# Patient Record
Sex: Male | Born: 1960 | Race: White | Hispanic: No | Marital: Married | Smoking: Never smoker
Health system: Southern US, Community
[De-identification: ages and names within clinical notes are randomized; demographics above are authoritative.]

## PROBLEM LIST (undated history)

## (undated) DIAGNOSIS — I10 Essential (primary) hypertension: Secondary | ICD-10-CM

## (undated) DIAGNOSIS — IMO0002 Reserved for concepts with insufficient information to code with codable children: Secondary | ICD-10-CM

## (undated) DIAGNOSIS — N189 Chronic kidney disease, unspecified: Secondary | ICD-10-CM

## (undated) DIAGNOSIS — M329 Systemic lupus erythematosus, unspecified: Secondary | ICD-10-CM

## (undated) DIAGNOSIS — D631 Anemia in chronic kidney disease: Secondary | ICD-10-CM

## (undated) DIAGNOSIS — C61 Malignant neoplasm of prostate: Secondary | ICD-10-CM

## (undated) DIAGNOSIS — Z940 Kidney transplant status: Secondary | ICD-10-CM

## (undated) DIAGNOSIS — I82409 Acute embolism and thrombosis of unspecified deep veins of unspecified lower extremity: Secondary | ICD-10-CM

## (undated) HISTORY — DX: Chronic kidney disease, unspecified: N18.9

## (undated) HISTORY — DX: Essential (primary) hypertension: I10

## (undated) HISTORY — DX: Anemia in chronic kidney disease: D63.1

---

## 1997-07-23 HISTORY — PX: LASIK: SHX215

## 1998-07-23 HISTORY — PX: OTHER SURGICAL HISTORY: SHX169

## 2015-03-22 DIAGNOSIS — I10 Essential (primary) hypertension: Secondary | ICD-10-CM | POA: Insufficient documentation

## 2015-03-22 DIAGNOSIS — G4700 Insomnia, unspecified: Secondary | ICD-10-CM | POA: Insufficient documentation

## 2015-04-11 DIAGNOSIS — D6189 Other specified aplastic anemias and other bone marrow failure syndromes: Secondary | ICD-10-CM | POA: Insufficient documentation

## 2016-05-22 DIAGNOSIS — M329 Systemic lupus erythematosus, unspecified: Secondary | ICD-10-CM | POA: Insufficient documentation

## 2016-06-11 DIAGNOSIS — D631 Anemia in chronic kidney disease: Secondary | ICD-10-CM | POA: Insufficient documentation

## 2016-06-26 DIAGNOSIS — Z9221 Personal history of antineoplastic chemotherapy: Secondary | ICD-10-CM | POA: Insufficient documentation

## 2016-06-26 DIAGNOSIS — Z79620 Long term (current) use of immunosuppressive biologic: Secondary | ICD-10-CM | POA: Insufficient documentation

## 2016-06-26 DIAGNOSIS — N189 Chronic kidney disease, unspecified: Secondary | ICD-10-CM | POA: Insufficient documentation

## 2016-06-26 DIAGNOSIS — N184 Chronic kidney disease, stage 4 (severe): Secondary | ICD-10-CM | POA: Insufficient documentation

## 2016-06-26 DIAGNOSIS — M3214 Glomerular disease in systemic lupus erythematosus: Secondary | ICD-10-CM | POA: Insufficient documentation

## 2016-07-03 DIAGNOSIS — N019 Rapidly progressive nephritic syndrome with unspecified morphologic changes: Secondary | ICD-10-CM | POA: Insufficient documentation

## 2017-11-01 DIAGNOSIS — N289 Disorder of kidney and ureter, unspecified: Secondary | ICD-10-CM | POA: Insufficient documentation

## 2017-11-15 DIAGNOSIS — E872 Acidosis, unspecified: Secondary | ICD-10-CM | POA: Insufficient documentation

## 2017-11-15 DIAGNOSIS — E559 Vitamin D deficiency, unspecified: Secondary | ICD-10-CM | POA: Insufficient documentation

## 2017-11-15 DIAGNOSIS — E7211 Homocystinuria: Secondary | ICD-10-CM | POA: Insufficient documentation

## 2018-09-03 ENCOUNTER — Other Ambulatory Visit (HOSPITAL_BASED_OUTPATIENT_CLINIC_OR_DEPARTMENT_OTHER): Payer: Self-pay

## 2018-09-03 DIAGNOSIS — R5383 Other fatigue: Secondary | ICD-10-CM

## 2018-10-26 ENCOUNTER — Encounter (HOSPITAL_BASED_OUTPATIENT_CLINIC_OR_DEPARTMENT_OTHER): Payer: Commercial Managed Care - PPO

## 2018-11-09 ENCOUNTER — Encounter (HOSPITAL_BASED_OUTPATIENT_CLINIC_OR_DEPARTMENT_OTHER): Payer: Commercial Managed Care - PPO

## 2018-12-04 ENCOUNTER — Ambulatory Visit (HOSPITAL_BASED_OUTPATIENT_CLINIC_OR_DEPARTMENT_OTHER): Payer: Commercial Managed Care - PPO | Attending: Internal Medicine | Admitting: Internal Medicine

## 2018-12-04 ENCOUNTER — Other Ambulatory Visit: Payer: Self-pay

## 2018-12-04 VITALS — Ht 70.0 in | Wt 185.0 lb

## 2018-12-04 DIAGNOSIS — Z79899 Other long term (current) drug therapy: Secondary | ICD-10-CM | POA: Diagnosis not present

## 2018-12-04 DIAGNOSIS — I10 Essential (primary) hypertension: Secondary | ICD-10-CM | POA: Insufficient documentation

## 2018-12-04 DIAGNOSIS — R0683 Snoring: Secondary | ICD-10-CM | POA: Insufficient documentation

## 2018-12-04 DIAGNOSIS — R5383 Other fatigue: Secondary | ICD-10-CM | POA: Diagnosis present

## 2018-12-12 DIAGNOSIS — R5383 Other fatigue: Secondary | ICD-10-CM

## 2018-12-12 NOTE — Procedures (Signed)
    Patient Name: Jordan Hanna, [place] Date: 12/04/2018 Gender: Male D.O.B: May 23, 1961 Age (years): 57 Referring Provider: Esmeralda Links MD Height (inches): 70 Interpreting Physician: Baird Lyons MD, ABSM Weight (lbs): 185 RPSGT: Gwenyth Allegra BMI: 27 MRN: [id number] Neck Size: 16.50  CLINICAL INFORMATION Sleep Study Type: NPSG Indication for sleep study: Fatigue, Hypertension Epworth Sleepiness Score: 4  SLEEP STUDY TECHNIQUE As per the AASM Manual for the Scoring of Sleep and Associated Events v2.3 (April 2016) with a hypopnea requiring 4% desaturations.  The channels recorded and monitored were frontal, central and occipital EEG, electrooculogram (EOG), submentalis EMG (chin), nasal and oral airflow, thoracic and abdominal wall motion, anterior tibialis EMG, snore microphone, electrocardiogram, and pulse oximetry.  MEDICATIONS Medications self-administered by patient taken the night of the study : Ambien, mirtazapine  SLEEP ARCHITECTURE The study was initiated at 9:51:32 PM and ended at 5:06:15 AM.  Sleep onset time was 30.0 minutes and the sleep efficiency was 72.2%%. The total sleep time was 313.7 minutes.  Stage REM latency was 105.0 minutes.  The patient spent 11.8%% of the night in stage N1 sleep, 77.4%% in stage N2 sleep, 0.0%% in stage N3 and 10.8% in REM.  Alpha intrusion was absent.  Supine sleep was 17.21%.  RESPIRATORY PARAMETERS The overall apnea/hypopnea index (AHI) was 2.1 per hour. There were 2 total apneas, including 1 obstructive, 1 central and 0 mixed apneas. There were 9 hypopneas and 38 RERAs.  The AHI during Stage REM sleep was 12.4 per hour.  AHI while supine was 3.3 per hour.  The mean oxygen saturation was 95.2%. The minimum SpO2 during sleep was 90.0%.  soft snoring was noted during this study.  CARDIAC DATA The 2 lead EKG demonstrated sinus rhythm. The mean heart rate was 81.9 beats per minute. Other EKG findings include:  None.  LEG MOVEMENT DATA The total PLMS were 0 with a resulting PLMS index of 0.0. Associated arousal with leg movement index was 0.4 .  IMPRESSIONS - No significant obstructive sleep apnea occurred during this study (AHI = 2.1/h). - No significant central sleep apnea occurred during this study (CAI = 0.2/h). - The patient had minimal or no oxygen desaturation during the study (Min O2 = 90.0%) - The patient snored with soft snoring volume. - No cardiac abnormalities were noted during this study. - Clinically significant periodic limb movements did not occur during sleep. No significant associated arousals. - Bathroom x 2  DIAGNOSIS - Normal study  RECOMMENDATIONS - Sleep hygiene should be reviewed to assess factors that may improve sleep quality. - Weight management and regular exercise should be initiated or continued if appropriate.  [Electronically signed] 12/12/2018 03:03 PM  Baird Lyons MD, [place], American Board of Sleep Medicine   NPI: [id number]                         [place] [place] [facility]  ELECTRONICALLY SIGNED ON:  12/12/2018, 2:59 PM [place] PH: [phone]   FX: [phone] [place]

## 2019-10-26 ENCOUNTER — Ambulatory Visit (INDEPENDENT_AMBULATORY_CARE_PROVIDER_SITE_OTHER): Payer: Commercial Managed Care - PPO | Admitting: Ophthalmology

## 2019-10-26 ENCOUNTER — Other Ambulatory Visit: Payer: Self-pay

## 2019-10-26 ENCOUNTER — Encounter (INDEPENDENT_AMBULATORY_CARE_PROVIDER_SITE_OTHER): Payer: Self-pay | Admitting: Ophthalmology

## 2019-10-26 DIAGNOSIS — H35721 Serous detachment of retinal pigment epithelium, right eye: Secondary | ICD-10-CM

## 2019-10-26 DIAGNOSIS — H35352 Cystoid macular degeneration, left eye: Secondary | ICD-10-CM

## 2019-10-26 DIAGNOSIS — H2511 Age-related nuclear cataract, right eye: Secondary | ICD-10-CM | POA: Insufficient documentation

## 2019-10-26 DIAGNOSIS — H353211 Exudative age-related macular degeneration, right eye, with active choroidal neovascularization: Secondary | ICD-10-CM | POA: Diagnosis not present

## 2019-10-26 DIAGNOSIS — M3214 Glomerular disease in systemic lupus erythematosus: Secondary | ICD-10-CM | POA: Diagnosis not present

## 2019-10-26 DIAGNOSIS — H43811 Vitreous degeneration, right eye: Secondary | ICD-10-CM | POA: Insufficient documentation

## 2019-10-26 DIAGNOSIS — H35351 Cystoid macular degeneration, right eye: Secondary | ICD-10-CM | POA: Diagnosis not present

## 2019-10-26 MED ORDER — AFLIBERCEPT 2MG/0.05ML IZ SOLN FOR KALEIDOSCOPE
2.0000 mg | INTRAVITREAL | Status: AC | PRN
  Administered 2019-10-26: 2 mg via INTRAVITREAL

## 2019-10-26 NOTE — Progress Notes (Addendum)
10/26/2019     CHIEF COMPLAINT Patient presents for Eye Exam   HISTORY OF PRESENT ILLNESS: Jordan Hanna is a 59 y.o. male who presents to the clinic today for:   HPI    Eye Exam    In right eye.  Characterized as no change in Amsler.  Severity is mild.  I, the attending physician,  performed the HPI with the patient and updated documentation appropriately.          Comments    5 Week AMD f\u OD. Possible Eylea od. OCT  Pt states no changes in vision.       Last edited by Tilda Franco on 10/26/2019  3:30 PM. (History)      Referring physician: No referring provider defined for this encounter.  HISTORICAL INFORMATION:   Selected notes from the MEDICAL RECORD NUMBER    CURRENT MEDICATIONS: No current outpatient medications on file. (Ophthalmic Drugs)   No current facility-administered medications for this visit. (Ophthalmic Drugs)   Current Outpatient Medications (Other)  Medication Sig  . hydroxychloroquine (PLAQUENIL) 200 MG tablet Take by mouth.  . riTUXimab-abbs ([place]) 500 MG/50ML injection Inject into the vein.  Marland Kitchen amLODipine (NORVASC) 5 MG tablet Take by mouth.  Marland Kitchen eplerenone (INSPRA) 25 MG tablet Take 25 mg by mouth daily.  Marland Kitchen losartan (COZAAR) 50 MG tablet Take by mouth.  . mirtazapine (REMERON) 30 MG tablet Take 30 mg by mouth at bedtime.  . mycophenolate (CELLCEPT) 500 MG tablet   . zolpidem (AMBIEN) 5 MG tablet Take 5 mg by mouth at bedtime as needed.   No current facility-administered medications for this visit. (Other)      REVIEW OF SYSTEMS: ROS    Positive for: Cardiovascular   Last edited by Tilda Franco on 10/26/2019  3:22 PM. (History)       ALLERGIES No Known Allergies  PAST MEDICAL HISTORY Past Medical History:  Diagnosis Date  . Chronic kidney disease   . Hypertension    Past Surgical History:  Procedure Laterality Date  . LASIK Bilateral 1999    FAMILY HISTORY Family History  Problem Relation Age of Onset  .  Diabetes Mother   . Hypertension Father   . Diabetes Father     SOCIAL HISTORY Social History   Tobacco Use  . Smoking status: Never Smoker  . Smokeless tobacco: Never Used  Substance Use Topics  . Alcohol use: Not on file  . Drug use: Not on file         OPHTHALMIC EXAM: Base Eye Exam    Visual Acuity (Snellen - Linear)      Right Left   Dist [place] 20/40 20/20 -2   Dist ph [place] 20/40 +        Tonometry (Tonopen, 3:21 PM)      Right Left   Pressure 13 10       Pupils      Pupils APD   Right PERRL None   Left PERRL None       Visual Fields      Left Right     Full   Restrictions Partial outer superior nasal deficiency        Dilation    Right eye: 1.0% Mydriacyl, 2.5% Phenylephrine @ 3:21 PM        Slit Lamp and Fundus Exam    External Exam      Right Left   External Normal Normal       Slit Lamp  Exam      Right Left   Lids/Lashes Normal    Conjunctiva/Sclera White and quiet    Cornea Clear    Anterior Chamber Deep and quiet    Iris Round and reactive    Lens 1+ Nuclear sclerosis    Vitreous Posterior vitreous detachment        Fundus Exam      Right Left   Disc Normal    C/D Ratio 0.4    Macula Atrophy, Age related macular degeneration, Advanced age related macular degeneration, Subretinal neovascular membrane, Choroidal neovascular membrane    Vessels Normal    Periphery Normal           IMAGING AND PROCEDURES  Imaging and Procedures for 10/26/19  OCT, Retina - OU - Both Eyes       Right Eye Quality was good. Scan locations included subfoveal. Progression has improved. Findings include cystoid macular edema, intraretinal fluid, subretinal scarring, outer retinal atrophy.   Left Eye Quality was good. Scan locations included subfoveal. Progression has been stable. Findings include cystoid macular edema.   Notes OD is now improved and stable on 5-week interval with Eylea. There is less cystoid macular edema and intraretinal fluid.  Underlying subretinal fibrosis does limit the visual acuity OD.  The left eye must be monitored as there is subretinal fluid and a para papillary location with minor CME which overall is stable over the last 5 to 6 months.  Repeat examination in 5 to 6 weeks       Intravitreal Injection, Pharmacologic Agent - OS - Left Eye       Time Out 10/26/2019. 4:42 PM. Confirmed correct patient, procedure, site, and patient consented.   Anesthesia Topical anesthesia was used.   Procedure Preparation included 5% betadine to ocular surface, 10% betadine to eyelids. A 30 gauge needle was used.   Injection:  2 mg aflibercept [name]   NDC: [id number], Lot: [id number]   Route: Intravitreal, Site: Left Eye, Waste: 0 mg  Post-op Post injection exam found visual acuity of at least counting fingers. The patient tolerated the procedure well. There were no complications. The patient received written and verbal post procedure care education. Post injection medications were not given.              THIS NOTE, ON FIRST DAY OF Epic EMR usage.    INCORRECT DOCUMENTATION OF LEFT EYE IN PROCEDURE INJECTION .   THE CORRECT EYE, THE RIGHT EYE,  WAS TREATED, AND THE INCORRECT BUTTON ON EMR ACTIVATED TO document OS injection.  RIGHT EYE INJECTED ON THIS DAY   ASSESSMENT/PLAN:    ICD-10-CM   1. Exudative age-related macular degeneration of right eye with active choroidal neovascularization (HCC)  H35.3211 OCT, Retina - OU - Both Eyes    Intravitreal Injection, Pharmacologic Agent - OS - Left Eye    aflibercept (EYLEA) SOLN 2 mg  2. 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 Cystoid macular edema of right eye  H35.351   5. Posterior vitreous detachment of right eye  H43.811   6. Nuclear sclerotic cataract of right eye  H25.11   7. Cystoid macular edema of left eye  H35.352     1. INJECTION OF EYLEA RIGHT EYE  TODAY.  2.  3.  Ophthalmic Meds Ordered this visit:  Meds ordered this encounter  Medications  . aflibercept (EYLEA) SOLN 2 mg  Return in about 5 weeks (around 11/30/2019) for EYLEA OCT, OD.  There are no Patient Instructions on file for this visit.   Explained the diagnoses, plan, and follow up with the patient and they expressed understanding.  Patient expressed understanding of the importance of proper follow up care.   Clent Demark Kema Santaella M.D. Diseases & Surgery of the Retina and Vitreous Retina & Diabetic [place] 10/26/19     Abbreviations: M myopia (nearsighted); A astigmatism; H hyperopia (farsighted); P presbyopia; Mrx spectacle prescription;  CTL contact lenses; OD right eye; OS left eye; OU both eyes  XT exotropia; ET esotropia; PEK punctate epithelial keratitis; PEE punctate epithelial erosions; DES dry eye syndrome; MGD meibomian gland dysfunction; ATs artificial tears; PFAT's preservative free artificial tears; [place] nuclear sclerotic cataract; PSC posterior subcapsular cataract; ERM epi-retinal membrane; PVD posterior vitreous detachment; RD retinal detachment; DM diabetes mellitus; DR diabetic retinopathy; NPDR non-proliferative diabetic retinopathy; PDR proliferative diabetic retinopathy; CSME clinically significant macular edema; DME diabetic macular edema; dbh dot blot hemorrhages; CWS cotton wool spot; POAG primary open angle glaucoma; C/D cup-to-disc ratio; HVF humphrey visual field; GVF goldmann visual field; OCT optical coherence tomography; IOP intraocular pressure; BRVO Branch retinal vein occlusion; CRVO central retinal vein occlusion; CRAO central retinal artery occlusion; BRAO branch retinal artery occlusion; RT retinal tear; SB scleral buckle; PPV pars plana vitrectomy; VH Vitreous hemorrhage; PRP panretinal laser photocoagulation; IVK intravitreal kenalog; VMT vitreomacular traction; MH Macular hole;  NVD neovascularization of the disc; NVE  neovascularization elsewhere; AREDS age related eye disease study; ARMD age related macular degeneration; POAG primary open angle glaucoma; EBMD epithelial/anterior basement membrane dystrophy; ACIOL anterior chamber intraocular lens; IOL intraocular lens; PCIOL posterior chamber intraocular lens; Phaco/IOL phacoemulsification with intraocular lens placement; [place] photorefractive keratectomy; LASIK laser assisted in situ keratomileusis; HTN hypertension; DM diabetes mellitus; COPD chronic obstructive pulmonary disease

## 2019-11-30 ENCOUNTER — Other Ambulatory Visit: Payer: Self-pay

## 2019-11-30 ENCOUNTER — Ambulatory Visit (INDEPENDENT_AMBULATORY_CARE_PROVIDER_SITE_OTHER): Payer: Commercial Managed Care - PPO | Admitting: Ophthalmology

## 2019-11-30 ENCOUNTER — Encounter (INDEPENDENT_AMBULATORY_CARE_PROVIDER_SITE_OTHER): Payer: Self-pay | Admitting: Ophthalmology

## 2019-11-30 DIAGNOSIS — H353211 Exudative age-related macular degeneration, right eye, with active choroidal neovascularization: Secondary | ICD-10-CM

## 2019-11-30 MED ORDER — AFLIBERCEPT 2MG/0.05ML IZ SOLN FOR KALEIDOSCOPE
2.0000 mg | INTRAVITREAL | Status: AC | PRN
  Administered 2019-11-30: 2 mg via INTRAVITREAL

## 2019-11-30 NOTE — Progress Notes (Signed)
11/30/2019     CHIEF COMPLAINT Patient presents for Retina Follow Up   HISTORY OF PRESENT ILLNESS: Jordan Hanna is a 59 y.o. male who presents to the clinic today for:   HPI    Retina Follow Up    Patient presents with  Wet AMD.  In right eye.  Duration of 5 weeks.  Since onset it is stable.          Comments    5 week follow up - OCT OU, Possible Eylea OD Patient denies change in vision and overall has no complaints.         Last edited by Hurman Horn, MD on 11/30/2019  4:46 PM. (History)      Referring physician: Dorthula Perfect, MD No address on file  HISTORICAL INFORMATION:   Selected notes from the [place]: No current outpatient medications on file. (Ophthalmic Drugs)   No current facility-administered medications for this visit. (Ophthalmic Drugs)   Current Outpatient Medications (Other)  Medication Sig  . amLODipine (NORVASC) 5 MG tablet Take by mouth.  Marland Kitchen eplerenone (INSPRA) 25 MG tablet Take 25 mg by mouth daily.  . hydroxychloroquine (PLAQUENIL) 200 MG tablet Take by mouth.  . losartan (COZAAR) 50 MG tablet Take by mouth.  . mirtazapine (REMERON) 30 MG tablet Take 30 mg by mouth at bedtime.  . mycophenolate (CELLCEPT) 500 MG tablet   . riTUXimab-abbs ([place]) 500 MG/50ML injection Inject into the vein.  Marland Kitchen zolpidem (AMBIEN) 5 MG tablet Take 5 mg by mouth at bedtime as needed.   No current facility-administered medications for this visit. (Other)      REVIEW OF SYSTEMS:    ALLERGIES No Known Allergies  PAST MEDICAL HISTORY Past Medical History:  Diagnosis Date  . Chronic kidney disease   . Hypertension    Past Surgical History:  Procedure Laterality Date  . LASIK Bilateral 1999    FAMILY HISTORY Family History  Problem Relation Age of Onset  . Diabetes Mother   . Hypertension Father   . Diabetes Father     SOCIAL HISTORY Social History   Tobacco Use  . Smoking status:  Never Smoker  . Smokeless tobacco: Never Used  Substance Use Topics  . Alcohol use: Not on file  . Drug use: Not on file         OPHTHALMIC EXAM:  Base Eye Exam    Visual Acuity (Snellen - Linear)      Right Left   Dist [place] 20/40+1 20/20   Dist ph [place] 20/30-1        Tonometry (Tonopen, 3:56 PM)      Right Left   Pressure 13 12       Pupils      Dark Light Shape React APD   Right 4 3 Round Brisk None   Left 4 3 Round Brisk None       Dilation    Right eye: 1.0% Mydriacyl, 2.5% Phenylephrine @ 3:56 PM        Slit Lamp and Fundus Exam    External Exam      Right Left   External Normal Normal       Slit Lamp Exam      Right Left   Lids/Lashes Normal Normal   Conjunctiva/Sclera White and quiet White and quiet   Cornea Clear Clear   Anterior Chamber Deep and quiet Deep and quiet   Iris Round  and reactive Round and reactive   Vitreous Posterior vitreous detachment Normal       Fundus Exam      Right Left   Disc Normal    C/D Ratio 0.5    Macula Disciform scar, Retinal pigment epithelial mottling, Retinal pigment epithelial atrophy    Vessels Normal    Periphery Normal           IMAGING AND PROCEDURES  Imaging and Procedures for 11/30/19  OCT, Retina - OU - Both Eyes       Right Eye Quality was good. Scan locations included subfoveal. Central Foveal Thickness: 321. Progression has improved. Findings include abnormal foveal contour, retinal drusen , subretinal hyper-reflective material, subretinal scarring.   Left Eye Quality was good. Scan locations included subfoveal. Central Foveal Thickness: 271. Findings include normal observations.   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       Intravitreal  Injection, Pharmacologic Agent - OD - Right Eye       Time Out 11/30/2019. 4:33 PM. Confirmed correct patient, procedure, site, and patient consented.   Anesthesia Topical anesthesia was used. Anesthetic medications included [place] 3.5%.   Procedure Preparation included Ofloxacin , 10% betadine to eyelids, 5% betadine to ocular surface. A 30 gauge needle was used.   Injection:  2 mg aflibercept [name]   NDC: [id number], Lot: [id number]   Route: Intravitreal, Site: Right Eye, Waste: 0 mg  Post-op Post injection exam found visual acuity of at least counting fingers. The patient tolerated the procedure well. There were no complications. The patient received written and verbal post procedure care education. Post injection medications were not given.                 ASSESSMENT/PLAN:  No problem-specific Assessment & Plan notes found for this encounter.      ICD-10-CM   1. Exudative age-related macular degeneration of right eye with active choroidal neovascularization (HCC)  H35.3211 OCT, Retina - OU - Both Eyes    Intravitreal Injection, Pharmacologic Agent - OD - Right Eye    aflibercept (EYLEA) SOLN 2 mg    1.  Macular findings OD continue to slowly improved.  Visual acuity is stable.  Visual acuity is limited by subfoveal disciform scarring.  Repeat intravitreal Eylea OD today and examination in 6 weeks  2.  OS continue to monitor CME nasal to the foveal avascular zone.  This is largely due to choriocapillaris atrophy.  3.  Ophthalmic Meds Ordered this visit:  Meds ordered this encounter  Medications  . aflibercept (EYLEA) SOLN 2 mg       Return in about 6 weeks (around 01/11/2020) for EYLEA OCT, OD.  Patient Instructions  Age-Related Macular Degeneration  Age-related macular degeneration (AMD) is an eye disease related to aging. The disease causes a loss of central vision. Central vision allows a person to see objects clearly and do daily tasks like  reading and driving. There are two main types of AMD:  Dry AMD. People with this type generally lose their vision slowly. This is the most common type of AMD. Some people with dry AMD notice very little change in their vision as they age.  Wet AMD. People with this type can lose their vision quickly. What are  the causes? This condition is caused by damage to the part of the eye that provides you with central vision (macula).  Dry AMD happens when deposits in the macula cause light-sensitive cells to slowly break down.  Wet AMD happens when abnormal blood vessels grow under the macula and leak blood and fluid. What increases the risk? You are more likely to develop this condition if you:  Are 29 years old or older, and especially 52 years old or older.  Smoke.  Are obese.  Have a family history of AMD.  Have high cholesterol, high blood pressure, or heart disease.  Have been exposed to high levels of ultraviolet (UV) light and blue light.  Are white (Caucasian).  Are male. What are the signs or symptoms? Common symptoms of this condition include:  Blurred vision, especially when reading print material. The blurred vision often improves in brighter light.  A blurred or blind spot in the center of your field of vision that is small but growing larger.  Bright colors seeming less bright than they used to be.  Decreased ability to recognize and see faces.  One eye seeing worse than the other.  Decreased ability to adapt to dimly lit rooms.  Straight lines appearing crooked or wavy. How is this diagnosed? This condition is diagnosed based on your symptoms and an eye exam. During the eye exam:  Eye drops will be placed into your eyes to enlarge (dilate) your pupils. This will allow your health care provider to see the back of your eye.  You may be asked to look at an image that looks like a checkerboard (Amsler grid). Early changes in your central vision may cause the  grid to appear distorted. After the exam, you may be given one or both of these tests:  Fluorescein angiogram. This test determines whether you have dry or wet AMD.  Optical coherence tomography (OCT) test to evaluate deep layers of the retina. How is this treated? There is no cure for this condition, but treatment can help to slow down progression of the disease. This condition may be treated with:  Supplements, including vitamin C, vitamin E, beta carotene, and zinc.  Laser surgery to destroy new blood vessels or leaking blood vessels in your eye.  Injections of medicines into your eye to slow down the formation of abnormal blood vessels that may leak. These injections may need to be repeated on a routine basis. Follow these instructions at home:  Take over-the-counter and prescription medicines only as told by your health care provider.  Take vitamins and supplements as told by your health care provider.  Ask your health care provider for an Amsler grid. Use it every day to check each eye for vision changes.  Get an eye exam as often as told by your health care provider. Make sure to get an eye exam at least once every year.  Keep all follow-up visits as told by your health care provider. This is important. Contact a health care provider if:  You notice any new changes in your vision. Get help right away if:  You suddenly lose vision or develop pain in the eye. Summary  Age-related macular degeneration (AMD) is an eye disease related to aging. There are two types of this condition: dry AMD and wet AMD.  This condition is caused by damage to the part of the eye that provides you with central vision (macula).  Once diagnosed with AMD, make sure to get an eye exam every  year, take supplements and vitamins as directed, use an Amsler grid at home, and follow up with your health care provider. This information is not intended to replace advice given to you by your health care  provider. Make sure you discuss any questions you have with your health care provider. Document Revised: 01/15/2018 Document Reviewed: 01/15/2018 Elsevier Patient Education  [place] the diagnoses, plan, and follow up with the patient and they expressed understanding.  Patient expressed understanding of the importance of proper follow up care.   Clent Demark Thornton Dohrmann M.D. Diseases & Surgery of the Retina and Vitreous Retina & Diabetic [place] 11/30/19     Abbreviations: M myopia (nearsighted); A astigmatism; H hyperopia (farsighted); P presbyopia; Mrx spectacle prescription;  CTL contact lenses; OD right eye; OS left eye; OU both eyes  XT exotropia; ET esotropia; PEK punctate epithelial keratitis; PEE punctate epithelial erosions; DES dry eye syndrome; MGD meibomian gland dysfunction; ATs artificial tears; PFAT's preservative free artificial tears; [place] nuclear sclerotic cataract; PSC posterior subcapsular cataract; ERM epi-retinal membrane; PVD posterior vitreous detachment; RD retinal detachment; DM diabetes mellitus; DR diabetic retinopathy; NPDR non-proliferative diabetic retinopathy; PDR proliferative diabetic retinopathy; CSME clinically significant macular edema; DME diabetic macular edema; dbh dot blot hemorrhages; CWS cotton wool spot; POAG primary open angle glaucoma; C/D cup-to-disc ratio; HVF humphrey visual field; GVF goldmann visual field; OCT optical coherence tomography; IOP intraocular pressure; BRVO Branch retinal vein occlusion; CRVO central retinal vein occlusion; CRAO central retinal artery occlusion; BRAO branch retinal artery occlusion; RT retinal tear; SB scleral buckle; PPV pars plana vitrectomy; VH Vitreous hemorrhage; PRP panretinal laser photocoagulation; IVK intravitreal kenalog; VMT vitreomacular traction; MH Macular hole;  NVD neovascularization of the disc; NVE neovascularization elsewhere; AREDS age related eye disease study; ARMD age related  macular degeneration; POAG primary open angle glaucoma; EBMD epithelial/anterior basement membrane dystrophy; ACIOL anterior chamber intraocular lens; IOL intraocular lens; PCIOL posterior chamber intraocular lens; Phaco/IOL phacoemulsification with intraocular lens placement; [place] photorefractive keratectomy; LASIK laser assisted in situ keratomileusis; HTN hypertension; DM diabetes mellitus; COPD chronic obstructive pulmonary disease

## 2019-11-30 NOTE — Patient Instructions (Signed)
Age-Related Macular Degeneration  Age-related macular degeneration (AMD) is an eye disease related to aging. The disease causes a loss of central vision. Central vision allows a person to see objects clearly and do daily tasks like reading and driving. There are two main types of AMD:  Dry AMD. People with this type generally lose their vision slowly. This is the most common type of AMD. Some people with dry AMD notice very little change in their vision as they age.  Wet AMD. People with this type can lose their vision quickly. What are the causes? This condition is caused by damage to the part of the eye that provides you with central vision (macula).  Dry AMD happens when deposits in the macula cause light-sensitive cells to slowly break down.  Wet AMD happens when abnormal blood vessels grow under the macula and leak blood and fluid. What increases the risk? You are more likely to develop this condition if you:  Are 50 years old or older, and especially 75 years old or older.  Smoke.  Are obese.  Have a family history of AMD.  Have high cholesterol, high blood pressure, or heart disease.  Have been exposed to high levels of ultraviolet (UV) light and blue light.  Are white (Caucasian).  Are male. What are the signs or symptoms? Common symptoms of this condition include:  Blurred vision, especially when reading print material. The blurred vision often improves in brighter light.  A blurred or blind spot in the center of your field of vision that is small but growing larger.  Bright colors seeming less bright than they used to be.  Decreased ability to recognize and see faces.  One eye seeing worse than the other.  Decreased ability to adapt to dimly lit rooms.  Straight lines appearing crooked or wavy. How is this diagnosed? This condition is diagnosed based on your symptoms and an eye exam. During the eye exam:  Eye drops will be placed into your eyes to  enlarge (dilate) your pupils. This will allow your health care provider to see the back of your eye.  You may be asked to look at an image that looks like a checkerboard (Amsler grid). Early changes in your central vision may cause the grid to appear distorted. After the exam, you may be given one or both of these tests:  Fluorescein angiogram. This test determines whether you have dry or wet AMD.  Optical coherence tomography (OCT) test to evaluate deep layers of the retina. How is this treated? There is no cure for this condition, but treatment can help to slow down progression of the disease. This condition may be treated with:  Supplements, including vitamin C, vitamin E, beta carotene, and zinc.  Laser surgery to destroy new blood vessels or leaking blood vessels in your eye.  Injections of medicines into your eye to slow down the formation of abnormal blood vessels that may leak. These injections may need to be repeated on a routine basis. Follow these instructions at home:  Take over-the-counter and prescription medicines only as told by your health care provider.  Take vitamins and supplements as told by your health care provider.  Ask your health care provider for an Amsler grid. Use it every day to check each eye for vision changes.  Get an eye exam as often as told by your health care provider. Make sure to get an eye exam at least once every year.  Keep all follow-up visits as told by   your health care provider. This is important. Contact a health care provider if:  You notice any new changes in your vision. Get help right away if:  You suddenly lose vision or develop pain in the eye. Summary  Age-related macular degeneration (AMD) is an eye disease related to aging. There are two types of this condition: dry AMD and wet AMD.  This condition is caused by damage to the part of the eye that provides you with central vision (macula).  Once diagnosed with AMD, make sure  to get an eye exam every year, take supplements and vitamins as directed, use an Amsler grid at home, and follow up with your health care provider. This information is not intended to replace advice given to you by your health care provider. Make sure you discuss any questions you have with your health care provider. Document Revised: 01/15/2018 Document Reviewed: 01/15/2018 Elsevier Patient Education  2020 Elsevier Inc.  

## 2020-01-11 ENCOUNTER — Ambulatory Visit (INDEPENDENT_AMBULATORY_CARE_PROVIDER_SITE_OTHER): Payer: Commercial Managed Care - PPO | Admitting: Ophthalmology

## 2020-01-11 ENCOUNTER — Encounter (INDEPENDENT_AMBULATORY_CARE_PROVIDER_SITE_OTHER): Payer: Self-pay | Admitting: Ophthalmology

## 2020-01-11 ENCOUNTER — Other Ambulatory Visit: Payer: Self-pay

## 2020-01-11 DIAGNOSIS — H4051X3 Glaucoma secondary to other eye disorders, right eye, severe stage: Secondary | ICD-10-CM | POA: Diagnosis not present

## 2020-01-11 DIAGNOSIS — H353211 Exudative age-related macular degeneration, right eye, with active choroidal neovascularization: Secondary | ICD-10-CM | POA: Insufficient documentation

## 2020-01-11 MED ORDER — AFLIBERCEPT 2MG/0.05ML IZ SOLN FOR KALEIDOSCOPE
2.0000 mg | INTRAVITREAL | Status: AC | PRN
  Administered 2020-01-11: 2 mg via INTRAVITREAL

## 2020-01-11 NOTE — Progress Notes (Signed)
01/11/2020     CHIEF COMPLAINT Patient presents for Retina Follow Up   HISTORY OF PRESENT ILLNESS: Jordan Hanna is a 59 y.o. male who presents to the clinic today for:   HPI    Retina Follow Up    Patient presents with  Wet AMD.  In right eye.  Duration of 6 weeks.  Since onset it is stable.          Comments    6 week follow up - OCT OU, Poss Eylea OD Patient denies change in vision and overall has no complaints.        Last edited by Gerda Diss on 01/11/2020  3:40 PM. (History)      Referring physician: Domingo Mend Darrick Penna, MD No address on file  HISTORICAL INFORMATION:   Selected notes from the [facility]: No current outpatient medications on file. (Ophthalmic Drugs)   No current facility-administered medications for this visit. (Ophthalmic Drugs)   Current Outpatient Medications (Other)  Medication Sig  . amLODipine (NORVASC) 5 MG tablet Take by mouth.  Marland Kitchen eplerenone (INSPRA) 25 MG tablet Take 25 mg by mouth daily.  . hydroxychloroquine (PLAQUENIL) 200 MG tablet Take by mouth.  . losartan (COZAAR) 50 MG tablet Take by mouth.  . mirtazapine (REMERON) 30 MG tablet Take 30 mg by mouth at bedtime.  . mycophenolate (CELLCEPT) 500 MG tablet   . riTUXimab-abbs (Berry) 500 MG/50ML injection Inject into the vein.  Marland Kitchen zolpidem (AMBIEN) 5 MG tablet Take 5 mg by mouth at bedtime as needed.   No current facility-administered medications for this visit. (Other)      REVIEW OF SYSTEMS:    ALLERGIES No Known Allergies  PAST MEDICAL HISTORY Past Medical History:  Diagnosis Date  . Chronic kidney disease   . Hypertension    Past Surgical History:  Procedure Laterality Date  . LASIK Bilateral 1999    FAMILY HISTORY Family History  Problem Relation Age of Onset  . Diabetes Mother   . Hypertension Father   . Diabetes Father     SOCIAL HISTORY Social History   Tobacco Use  . Smoking status: Never  Smoker  . Smokeless tobacco: Never Used  Substance Use Topics  . Alcohol use: Not on file  . Drug use: Not on file         OPHTHALMIC EXAM:  Base Eye Exam    Visual Acuity (Snellen - Linear)      Right Left   Dist [place] 20/50+2 20/20-1   Dist ph [place] 20/30        Tonometry (Tonopen, 3:48 PM)      Right Left   Pressure 11 11       Pupils      Pupils Dark Light Shape React APD   Right PERRL 4 3 Round Brisk None   Left PERRL 4 3 Round Brisk Trace       Visual Fields (Counting fingers)      Left Right    Full Full       Extraocular Movement      Right Left    Full Full       Neuro/Psych    Oriented x3: Yes   Mood/Affect: Normal       Dilation    Right eye: 1.0% Mydriacyl, 2.5% Phenylephrine @ 3:48 PM        Slit Lamp and Fundus Exam    External Exam  Right Left   External Normal Normal       Slit Lamp Exam      Right Left   Lids/Lashes Normal Normal   Conjunctiva/Sclera White and quiet White and quiet   Cornea Clear Clear   Anterior Chamber Deep and quiet Deep and quiet   Iris Round and reactive Round and reactive   Anterior Vitreous Normal Normal       Fundus Exam      Right Left   Posterior Vitreous Posterior vitreous detachment Normal   Disc Normal    C/D Ratio 0.45    Macula Disciform scar, Retinal pigment epithelial mottling, Retinal pigment epithelial atrophy    Vessels Normal    Periphery Normal           IMAGING AND PROCEDURES  Imaging and Procedures for 01/11/20  OCT, Retina - OU - Both Eyes       Right Eye Quality was good. Scan locations included subfoveal. Central Foveal Thickness: 319. Progression has improved. Findings include normal observations, subretinal hyper-reflective material.   Left Eye Quality was good. Scan locations included subfoveal. Central Foveal Thickness: 272. Progression has improved. Findings include retinal drusen .   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  Now well controlled on intravitreal Eylea OD.  Currently at 6-week interval examination OD.      ICD-10-CM   1. Exudative age-related macular degeneration of right eye with active choroidal neovascularization (HCC)  H35.3211 OCT, Retina - OU - Both Eyes    1.  Repeat intravitreal Eylea OD today  2.  Repeat examination OD in 7 weeks and possible intravitreal Eylea  3.  Ophthalmic Meds Ordered this visit:  No orders of the defined types were placed in this encounter.      Return in about 7 weeks (around 02/29/2020) for dilate, OD, EYLEA OCT.  There are no Patient Instructions on file for this visit.   Explained the diagnoses, plan, and follow up with the patient and they expressed understanding.  Patient expressed understanding of the importance of proper follow up care.   Clent Demark Detria Cummings M.D. Diseases & Surgery of the Retina and Vitreous [facility] [place] 01/11/20     Abbreviations: M myopia (nearsighted); A astigmatism; H hyperopia (farsighted); P presbyopia; Mrx spectacle prescription;  CTL contact lenses; OD right eye; OS left eye; OU both eyes  XT exotropia; ET esotropia; PEK punctate epithelial keratitis; PEE punctate epithelial erosions; DES dry eye syndrome; MGD meibomian gland dysfunction; ATs artificial tears; PFAT's preservative free artificial tears; [place] nuclear sclerotic cataract; PSC posterior subcapsular cataract; ERM epi-retinal membrane; PVD posterior vitreous detachment; RD retinal detachment; DM diabetes mellitus; DR diabetic retinopathy; NPDR non-proliferative diabetic  retinopathy; PDR proliferative diabetic retinopathy; CSME clinically significant macular edema; DME diabetic macular edema; dbh dot blot hemorrhages; CWS cotton wool spot; POAG primary open angle glaucoma; C/D cup-to-disc ratio; HVF humphrey visual field; GVF goldmann visual field; OCT optical coherence tomography; IOP intraocular pressure; BRVO Branch retinal vein occlusion; CRVO central retinal vein occlusion; CRAO central retinal artery occlusion; BRAO branch retinal artery occlusion; RT retinal tear; SB scleral  buckle; PPV pars plana vitrectomy; VH Vitreous hemorrhage; PRP panretinal laser photocoagulation; IVK intravitreal kenalog; VMT vitreomacular traction; MH Macular hole;  NVD neovascularization of the disc; NVE neovascularization elsewhere; AREDS age related eye disease study; ARMD age related macular degeneration; POAG primary open angle glaucoma; EBMD epithelial/anterior basement membrane dystrophy; ACIOL anterior chamber intraocular lens; IOL intraocular lens; PCIOL posterior chamber intraocular lens; Phaco/IOL phacoemulsification with intraocular lens placement; [place] photorefractive keratectomy; LASIK laser assisted in situ keratomileusis; HTN hypertension; DM diabetes mellitus; COPD chronic obstructive pulmonary disease

## 2020-01-11 NOTE — Assessment & Plan Note (Signed)
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  Now well controlled on intravitreal Eylea OD.  Currently at 6-week interval examination OD.

## 2020-01-13 ENCOUNTER — Other Ambulatory Visit: Payer: Self-pay | Admitting: Internal Medicine

## 2020-01-13 DIAGNOSIS — N183 Chronic kidney disease, stage 3 unspecified: Secondary | ICD-10-CM

## 2020-02-01 ENCOUNTER — Ambulatory Visit
Admission: RE | Admit: 2020-02-01 | Discharge: 2020-02-01 | Disposition: A | Discharge status: Home / Self Care | Payer: Commercial Managed Care - PPO | Source: Ambulatory Visit | Attending: Internal Medicine | Admitting: Internal Medicine

## 2020-02-01 DIAGNOSIS — N183 Chronic kidney disease, stage 3 unspecified: Secondary | ICD-10-CM

## 2020-02-17 ENCOUNTER — Other Ambulatory Visit (HOSPITAL_COMMUNITY): Payer: Self-pay

## 2020-02-18 ENCOUNTER — Ambulatory Visit (HOSPITAL_COMMUNITY)
Admission: RE | Admit: 2020-02-18 | Discharge: 2020-02-18 | Disposition: A | Discharge status: Home / Self Care | Payer: Commercial Managed Care - PPO | Source: Ambulatory Visit | Attending: Nephrology | Admitting: Nephrology

## 2020-02-18 ENCOUNTER — Other Ambulatory Visit: Payer: Self-pay

## 2020-02-18 DIAGNOSIS — N185 Chronic kidney disease, stage 5: Secondary | ICD-10-CM | POA: Insufficient documentation

## 2020-02-18 DIAGNOSIS — D631 Anemia in chronic kidney disease: Secondary | ICD-10-CM | POA: Insufficient documentation

## 2020-02-18 LAB — POCT HEMOGLOBIN-HEMACUE: Hemoglobin: 9.8 g/dL — ABNORMAL LOW (ref 13.0–17.0)

## 2020-02-18 MED ORDER — EPOETIN ALFA-EPBX 10000 UNIT/ML IJ SOLN
INTRAMUSCULAR | Status: AC
  Filled 2020-02-18: qty 1

## 2020-02-18 MED ORDER — EPOETIN ALFA-EPBX 10000 UNIT/ML IJ SOLN
10000.0000 [IU] | Freq: Once | INTRAMUSCULAR | Status: AC
  Administered 2020-02-18: 10000 [IU] via SUBCUTANEOUS

## 2020-02-18 MED ORDER — SODIUM CHLORIDE 0.9 % IV SOLN
510.0000 mg | Freq: Once | INTRAVENOUS | Status: AC
  Administered 2020-02-18: 510 mg via INTRAVENOUS
  Filled 2020-02-18: qty 17

## 2020-02-18 NOTE — Discharge Instructions (Signed)
  Epoetin Alfa injection What is this medicine? EPOETIN ALFA (e POE e tin AL fa) helps your body make more red blood cells. This medicine is used to treat anemia caused by chronic kidney disease, cancer chemotherapy, or HIV-therapy. It may also be used before surgery if you have anemia. This medicine may be used for other purposes; ask your health care provider or pharmacist if you have questions. COMMON BRAND NAME(S): Epogen, Procrit, Retacrit What should I tell my health care provider before I take this medicine? They need to know if you have any of these conditions:  cancer  heart disease  high blood pressure  history of blood clots  history of stroke  low levels of folate, iron, or vitamin B12 in the blood  seizures  an unusual or allergic reaction to erythropoietin, albumin, benzyl alcohol, hamster proteins, other medicines, foods, dyes, or preservatives  pregnant or trying to get pregnant  breast-feeding How should I use this medicine? This medicine is for injection into a vein or under the skin. It is usually given by a health care professional in a hospital or clinic setting. If you get this medicine at home, you will be taught how to prepare and give this medicine. Use exactly as directed. Take your medicine at regular intervals. Do not take your medicine more often than directed. It is important that you put your used needles and syringes in a special sharps container. Do not put them in a trash can. If you do not have a sharps container, call your pharmacist or healthcare provider to get one. A special MedGuide will be given to you by the pharmacist with each prescription and refill. Be sure to read this information carefully each time. Talk to your pediatrician regarding the use of this medicine in children. While this drug may be prescribed for selected conditions, precautions do apply. Overdosage: If you think you have taken too much of this medicine contact a  poison control center or emergency room at once. NOTE: This medicine is only for you. Do not share this medicine with others. What if I miss a dose? If you miss a dose, take it as soon as you can. If it is almost time for your next dose, take only that dose. Do not take double or extra doses. What may interact with this medicine? Interactions have not been studied. This list may not describe all possible interactions. Give your health care provider a list of all the medicines, herbs, non-prescription drugs, or dietary supplements you use. Also tell them if you smoke, drink alcohol, or use illegal drugs. Some items may interact with your medicine. What should I watch for while using this medicine? Your condition will be monitored carefully while you are receiving this medicine. You may need blood work done while you are taking this medicine. This medicine may cause a decrease in vitamin B6. You should make sure that you get enough vitamin B6 while you are taking this medicine. Discuss the foods you eat and the vitamins you take with your health care professional. What side effects may I notice from receiving this medicine? Side effects that you should report to your doctor or health care professional as soon as possible:  allergic reactions like skin rash, itching or hives, swelling of the face, lips, or tongue  seizures  signs and symptoms of a blood clot such as breathing problems; changes in vision; chest pain; severe, sudden headache; pain, swelling, warmth in the leg; trouble speaking; sudden   numbness or weakness of the face, arm or leg  signs and symptoms of a stroke like changes in vision; confusion; trouble speaking or understanding; severe headaches; sudden numbness or weakness of the face, arm or leg; trouble walking; dizziness; loss of balance or coordination Side effects that usually do not require medical attention (report to your doctor or health care professional if they continue  or are bothersome):  chills  cough  dizziness  fever  headaches  joint pain  muscle cramps  muscle pain  nausea, vomiting  pain, redness, or irritation at site where injected This list may not describe all possible side effects. Call your doctor for medical advice about side effects. You may report side effects to FDA at 1-800-FDA-1088. Where should I keep my medicine? Keep out of the reach of children. Store in a refrigerator between 2 and 8 degrees C (36 and 46 degrees F). Do not freeze or shake. Throw away any unused portion if using a single-dose vial. Multi-dose vials can be kept in the refrigerator for up to 21 days after the initial dose. Throw away unused medicine. NOTE: This sheet is a summary. It may not cover all possible information. If you have questions about this medicine, talk to your doctor, pharmacist, or health care provider.  2020 Elsevier/Gold Standard (2017-02-15 08:35:19) Ferumoxytol injection What is this medicine? FERUMOXYTOL is an iron complex. Iron is used to make healthy red blood cells, which carry oxygen and nutrients throughout the body. This medicine is used to treat iron deficiency anemia. This medicine may be used for other purposes; ask your health care provider or pharmacist if you have questions. COMMON BRAND NAME(S): Feraheme What should I tell my health care provider before I take this medicine? They need to know if you have any of these conditions:  anemia not caused by low iron levels  high levels of iron in the blood  magnetic resonance imaging (MRI) test scheduled  an unusual or allergic reaction to iron, other medicines, foods, dyes, or preservatives  pregnant or trying to get pregnant  breast-feeding How should I use this medicine? This medicine is for injection into a vein. It is given by a health care professional in a hospital or clinic setting. Talk to your pediatrician regarding the use of this medicine in children.  Special care may be needed. Overdosage: If you think you have taken too much of this medicine contact a poison control center or emergency room at once. NOTE: This medicine is only for you. Do not share this medicine with others. What if I miss a dose? It is important not to miss your dose. Call your doctor or health care professional if you are unable to keep an appointment. What may interact with this medicine? This medicine may interact with the following medications:  other iron products This list may not describe all possible interactions. Give your health care provider a list of all the medicines, herbs, non-prescription drugs, or dietary supplements you use. Also tell them if you smoke, drink alcohol, or use illegal drugs. Some items may interact with your medicine. What should I watch for while using this medicine? Visit your doctor or healthcare professional regularly. Tell your doctor or healthcare professional if your symptoms do not start to get better or if they get worse. You may need blood work done while you are taking this medicine. You may need to follow a special diet. Talk to your doctor. Foods that contain iron include: whole grains/cereals, dried fruits,   beans, or peas, leafy green vegetables, and organ meats (liver, kidney). What side effects may I notice from receiving this medicine? Side effects that you should report to your doctor or health care professional as soon as possible:  allergic reactions like skin rash, itching or hives, swelling of the face, lips, or tongue  breathing problems  changes in blood pressure  feeling faint or lightheaded, falls  fever or chills  flushing, sweating, or hot feelings  swelling of the ankles or feet Side effects that usually do not require medical attention (report to your doctor or health care professional if they continue or are bothersome):  diarrhea  headache  nausea, vomiting  stomach pain This list may not  describe all possible side effects. Call your doctor for medical advice about side effects. You may report side effects to FDA at 1-800-FDA-1088. Where should I keep my medicine? This drug is given in a hospital or clinic and will not be stored at home. NOTE: This sheet is a summary. It may not cover all possible information. If you have questions about this medicine, talk to your doctor, pharmacist, or health care provider.  2020 Elsevier/Gold Standard (2016-08-27 20:21:10)  

## 2020-02-22 ENCOUNTER — Encounter (INDEPENDENT_AMBULATORY_CARE_PROVIDER_SITE_OTHER): Payer: Commercial Managed Care - PPO | Admitting: Ophthalmology

## 2020-02-22 ENCOUNTER — Encounter: Payer: Self-pay | Admitting: Nephrology

## 2020-02-25 ENCOUNTER — Other Ambulatory Visit: Payer: Self-pay

## 2020-02-25 ENCOUNTER — Encounter (INDEPENDENT_AMBULATORY_CARE_PROVIDER_SITE_OTHER): Payer: Self-pay | Admitting: Ophthalmology

## 2020-02-25 ENCOUNTER — Ambulatory Visit (INDEPENDENT_AMBULATORY_CARE_PROVIDER_SITE_OTHER): Payer: Commercial Managed Care - PPO | Admitting: Ophthalmology

## 2020-02-25 DIAGNOSIS — H353211 Exudative age-related macular degeneration, right eye, with active choroidal neovascularization: Secondary | ICD-10-CM

## 2020-02-25 MED ORDER — AFLIBERCEPT 2MG/0.05ML IZ SOLN FOR KALEIDOSCOPE
2.0000 mg | INTRAVITREAL | Status: AC | PRN
  Administered 2020-02-25: 2 mg via INTRAVITREAL

## 2020-02-25 NOTE — Assessment & Plan Note (Signed)
Currently at 7-week interval, will need injection intravitreal Eylea today.  With new intraretinal fluid likely however from systemic effects of anemia, will nonetheless repeat examination in 6 weeks to maintain excellent visual functioning

## 2020-02-25 NOTE — Progress Notes (Signed)
02/25/2020     CHIEF COMPLAINT Patient presents for Retina Follow Up   HISTORY OF PRESENT ILLNESS: Jordan Hanna is a 59 y.o. male who presents to the clinic today for:   HPI    Retina Follow Up    Patient presents with  Wet AMD.  In right eye.  Duration of 7 weeks.  Since onset it is stable.          Comments    7 week follow up - OCT OU, Poss Eylea OD Patient denies change in vision and overall has no complaints.        Last edited by Gerda Diss on 02/25/2020  3:50 PM. (History)      Referring physician: Domingo Mend Darrick Penna, MD No address on file  HISTORICAL INFORMATION:   Selected notes from the [facility]: No current outpatient medications on file. (Ophthalmic Drugs)   No current facility-administered medications for this visit. (Ophthalmic Drugs)   Current Outpatient Medications (Other)  Medication Sig  . epoetin alfa (EPOGEN) 10000 UNIT/ML injection 10,000 Units every 14 (fourteen) days.  Marland Kitchen amLODipine (NORVASC) 5 MG tablet Take by mouth.  Marland Kitchen eplerenone (INSPRA) 25 MG tablet Take 25 mg by mouth daily.  . hydroxychloroquine (PLAQUENIL) 200 MG tablet Take by mouth.  . losartan (COZAAR) 50 MG tablet Take by mouth.  . mirtazapine (REMERON) 30 MG tablet Take 30 mg by mouth at bedtime.  . mycophenolate (CELLCEPT) 500 MG tablet   . riTUXimab-abbs ([place]) 500 MG/50ML injection Inject into the vein.  Marland Kitchen zolpidem (AMBIEN) 5 MG tablet Take 5 mg by mouth at bedtime as needed.   No current facility-administered medications for this visit. (Other)      REVIEW OF SYSTEMS:    ALLERGIES Allergies  Allergen Reactions  . Prednisone Swelling    Swelling behind the eye that causes not being able to see    PAST MEDICAL HISTORY Past Medical History:  Diagnosis Date  . Anemia in chronic kidney disease (CKD)   . Chronic kidney disease   . Hypertension    Past Surgical History:  Procedure Laterality Date  . LASIK  Bilateral 1999    FAMILY HISTORY Family History  Problem Relation Age of Onset  . Diabetes Mother   . Hypertension Father   . Diabetes Father     SOCIAL HISTORY Social History   Tobacco Use  . Smoking status: Never Smoker  . Smokeless tobacco: Never Used  Substance Use Topics  . Alcohol use: Not on file  . Drug use: Not on file         OPHTHALMIC EXAM:  Base Eye Exam    Visual Acuity (Snellen - Linear)      Right Left   Dist [place] 20/40-2 20/20-2   Dist ph [place] NI        Tonometry (Tonopen, 4:00 PM)      Right Left   Pressure 11 10       Pupils      Pupils Dark Light Shape React APD   Right PERRL 4 3 Round Brisk None   Left PERRL 4 3 Round Brisk None       Visual Fields (Counting fingers)      Left Right    Full Full       Extraocular Movement      Right Left    Full Full       Neuro/Psych    Oriented  x3: Yes   Mood/Affect: Normal       Dilation    Right eye: 1.0% Mydriacyl, 2.5% Phenylephrine @ 4:00 PM        Slit Lamp and Fundus Exam    External Exam      Right Left   External Normal Normal       Slit Lamp Exam      Right Left   Lids/Lashes Normal Normal   Conjunctiva/Sclera White and quiet White and quiet   Cornea Clear Clear   Anterior Chamber Deep and quiet Deep and quiet   Iris Round and reactive Round and reactive   Lens 1+ Nuclear sclerosis 1+ Nuclear sclerosis   Anterior Vitreous Normal Normal       Fundus Exam      Right Left   Posterior Vitreous Posterior vitreous detachment    Disc Normal    C/D Ratio 0.5    Macula Disciform scar, Retinal pigment epithelial mottling, Retinal pigment epithelial atrophy, no hemorrhage, no exudates    Vessels Normal    Periphery Normal           IMAGING AND PROCEDURES  Imaging and Procedures for 02/25/20  OCT, Retina - OU - Both Eyes       Right Eye Quality was good. Scan locations included subfoveal. Central Foveal Thickness: 327. Progression has improved. Findings include  subretinal scarring, disciform scar, cystoid macular edema, intraretinal fluid.   Left Eye Quality was good. Scan locations included subfoveal. Central Foveal Thickness: 275. Findings include no SRF, subretinal scarring, central retinal atrophy, outer retinal atrophy, inner retinal atrophy.   Notes New intraretinal fluid lining the disciform scar, this could be as a result of the recent systemic stress of anemia now corrected       Intravitreal Injection, Pharmacologic Agent - OD - Right Eye       Time Out 02/25/2020. 4:36 PM. Confirmed correct patient, procedure, site, and patient consented.   Anesthesia Topical anesthesia was used. Anesthetic medications included Akten 3.5%.   Procedure Preparation included Tobramycin 0.3%, 10% betadine to eyelids, 5% betadine to ocular surface. A 30 gauge needle was used.   Injection:  2 mg aflibercept [name]   NDC: A3590391, Lot: [id number]   Route: Intravitreal, Site: Right Eye, Waste: 0 mg  Post-op Post injection exam found visual acuity of at least counting fingers. The patient tolerated the procedure well. There were no complications. The patient received written and verbal post procedure care education. Post injection medications were not given.                 ASSESSMENT/PLAN:  Exudative age-related macular degeneration of right eye with active choroidal neovascularization (HCC) Currently at 7-week interval, will need injection intravitreal Eylea today.  With new intraretinal fluid likely however from systemic effects of anemia, will nonetheless repeat examination in 6 weeks to maintain excellent visual functioning      ICD-10-CM   1. Exudative age-related macular degeneration of right eye with active choroidal neovascularization (HCC)  H35.3211 OCT, Retina - OU - Both Eyes    Intravitreal Injection, Pharmacologic Agent - OD - Right Eye    aflibercept (EYLEA) SOLN 2 mg    1.  2.  3.  Ophthalmic Meds Ordered  this visit:  Meds ordered this encounter  Medications  . aflibercept (EYLEA) SOLN 2 mg       No follow-ups on file.  There are no Patient Instructions on file for this visit.   Explained the  diagnoses, plan, and follow up with the patient and they expressed understanding.  Patient expressed understanding of the importance of proper follow up care.   Clent Demark Kareem Cathey M.D. Diseases & Surgery of the Retina and Vitreous Retina & Diabetic [place] 02/25/20     Abbreviations: M myopia (nearsighted); A astigmatism; H hyperopia (farsighted); P presbyopia; Mrx spectacle prescription;  CTL contact lenses; OD right eye; OS left eye; OU both eyes  XT exotropia; ET esotropia; PEK punctate epithelial keratitis; PEE punctate epithelial erosions; DES dry eye syndrome; MGD meibomian gland dysfunction; ATs artificial tears; PFAT's preservative free artificial tears; [place] nuclear sclerotic cataract; PSC posterior subcapsular cataract; ERM epi-retinal membrane; PVD posterior vitreous detachment; RD retinal detachment; DM diabetes mellitus; DR diabetic retinopathy; NPDR non-proliferative diabetic retinopathy; PDR proliferative diabetic retinopathy; CSME clinically significant macular edema; DME diabetic macular edema; dbh dot blot hemorrhages; CWS cotton wool spot; POAG primary open angle glaucoma; C/D cup-to-disc ratio; HVF humphrey visual field; GVF goldmann visual field; OCT optical coherence tomography; IOP intraocular pressure; BRVO Branch retinal vein occlusion; CRVO central retinal vein occlusion; CRAO central retinal artery occlusion; BRAO branch retinal artery occlusion; RT retinal tear; SB scleral buckle; PPV pars plana vitrectomy; VH Vitreous hemorrhage; PRP panretinal laser photocoagulation; IVK intravitreal kenalog; VMT vitreomacular traction; MH Macular hole;  NVD neovascularization of the disc; NVE neovascularization elsewhere; AREDS age related eye disease study; ARMD age related macular  degeneration; POAG primary open angle glaucoma; EBMD epithelial/anterior basement membrane dystrophy; ACIOL anterior chamber intraocular lens; IOL intraocular lens; PCIOL posterior chamber intraocular lens; Phaco/IOL phacoemulsification with intraocular lens placement; [place] Proper photorefractive keratectomy; LASIK laser assisted in situ keratomileusis; HTN hypertension; DM diabetes mellitus; COPD chronic obstructive pulmonary disease

## 2020-02-29 ENCOUNTER — Encounter (INDEPENDENT_AMBULATORY_CARE_PROVIDER_SITE_OTHER): Payer: Commercial Managed Care - PPO | Admitting: Ophthalmology

## 2020-03-03 ENCOUNTER — Encounter (HOSPITAL_COMMUNITY): Payer: Commercial Managed Care - PPO

## 2020-03-09 ENCOUNTER — Other Ambulatory Visit: Payer: Self-pay

## 2020-03-09 ENCOUNTER — Encounter (HOSPITAL_COMMUNITY)
Admission: RE | Admit: 2020-03-09 | Discharge: 2020-03-09 | Disposition: A | Discharge status: Home / Self Care | Payer: Commercial Managed Care - PPO | Source: Ambulatory Visit | Attending: Nephrology | Admitting: Nephrology

## 2020-03-09 DIAGNOSIS — D631 Anemia in chronic kidney disease: Secondary | ICD-10-CM | POA: Insufficient documentation

## 2020-03-09 DIAGNOSIS — N185 Chronic kidney disease, stage 5: Secondary | ICD-10-CM | POA: Insufficient documentation

## 2020-03-09 LAB — POCT HEMOGLOBIN-HEMACUE: Hemoglobin: 8.6 g/dL — ABNORMAL LOW (ref 13.0–17.0)

## 2020-03-09 MED ORDER — EPOETIN ALFA-EPBX 10000 UNIT/ML IJ SOLN
INTRAMUSCULAR | Status: AC
  Administered 2020-03-09: 10000 [IU] via SUBCUTANEOUS
  Filled 2020-03-09: qty 1

## 2020-03-09 MED ORDER — EPOETIN ALFA-EPBX 10000 UNIT/ML IJ SOLN: 10000.0000 [IU] | Freq: Once | INTRAMUSCULAR | Status: AC

## 2020-03-23 ENCOUNTER — Encounter (HOSPITAL_COMMUNITY): Payer: Commercial Managed Care - PPO

## 2020-03-30 ENCOUNTER — Encounter (HOSPITAL_COMMUNITY): Payer: Commercial Managed Care - PPO

## 2020-03-30 ENCOUNTER — Other Ambulatory Visit (HOSPITAL_COMMUNITY): Payer: Self-pay | Admitting: *Deleted

## 2020-03-31 ENCOUNTER — Other Ambulatory Visit: Payer: Self-pay

## 2020-03-31 ENCOUNTER — Ambulatory Visit (HOSPITAL_COMMUNITY)
Admission: RE | Admit: 2020-03-31 | Discharge: 2020-03-31 | Disposition: A | Discharge status: Home / Self Care | Payer: Commercial Managed Care - PPO | Source: Ambulatory Visit | Attending: Nephrology | Admitting: Nephrology

## 2020-03-31 DIAGNOSIS — N185 Chronic kidney disease, stage 5: Secondary | ICD-10-CM | POA: Diagnosis present

## 2020-03-31 DIAGNOSIS — D631 Anemia in chronic kidney disease: Secondary | ICD-10-CM | POA: Insufficient documentation

## 2020-03-31 LAB — IRON AND TIBC
Iron: 83 ug/dL (ref 45–182)
Saturation Ratios: 27 % (ref 17.9–39.5)
TIBC: 304 ug/dL (ref 250–450)
UIBC: 221 ug/dL

## 2020-03-31 LAB — FERRITIN: Ferritin: 312 ng/mL (ref 24–336)

## 2020-03-31 LAB — POCT HEMOGLOBIN-HEMACUE: Hemoglobin: 8.3 g/dL — ABNORMAL LOW (ref 13.0–17.0)

## 2020-03-31 MED ORDER — EPOETIN ALFA-EPBX 10000 UNIT/ML IJ SOLN
20000.0000 [IU] | INTRAMUSCULAR | Status: DC
  Administered 2020-03-31: 20000 [IU] via SUBCUTANEOUS

## 2020-03-31 MED ORDER — EPOETIN ALFA-EPBX 10000 UNIT/ML IJ SOLN: 10000.0000 [IU] | Freq: Once | INTRAMUSCULAR | Status: DC

## 2020-03-31 MED ORDER — EPOETIN ALFA-EPBX 10000 UNIT/ML IJ SOLN
INTRAMUSCULAR | Status: AC
  Filled 2020-03-31: qty 2

## 2020-04-07 ENCOUNTER — Encounter (INDEPENDENT_AMBULATORY_CARE_PROVIDER_SITE_OTHER): Payer: Commercial Managed Care - PPO | Admitting: Ophthalmology

## 2020-04-11 ENCOUNTER — Other Ambulatory Visit: Payer: Self-pay

## 2020-04-11 ENCOUNTER — Ambulatory Visit (INDEPENDENT_AMBULATORY_CARE_PROVIDER_SITE_OTHER): Payer: Commercial Managed Care - PPO | Admitting: Ophthalmology

## 2020-04-11 ENCOUNTER — Encounter (INDEPENDENT_AMBULATORY_CARE_PROVIDER_SITE_OTHER): Payer: Self-pay | Admitting: Ophthalmology

## 2020-04-11 DIAGNOSIS — H353211 Exudative age-related macular degeneration, right eye, with active choroidal neovascularization: Secondary | ICD-10-CM

## 2020-04-11 MED ORDER — AFLIBERCEPT 2MG/0.05ML IZ SOLN FOR KALEIDOSCOPE
2.0000 mg | INTRAVITREAL | Status: AC | PRN
  Administered 2020-04-11: 2 mg via INTRAVITREAL

## 2020-04-11 NOTE — Progress Notes (Signed)
04/11/2020     CHIEF COMPLAINT Patient presents for Retina Follow Up   HISTORY OF PRESENT ILLNESS: Jordan Hanna is a 59 y.o. male who presents to the clinic today for:   HPI    Retina Follow Up    Patient presents with  Wet AMD.  In right eye.  This started 7 weeks ago.  Severity is mild.  Duration of 7 weeks.  Since onset it is stable.          Comments    7 Week AMD F/U OD, poss Eylea OD  Pt denies noticeable changes to [place] OU since last visit. Pt denies ocular pain, flashes of light, or floaters OU.         Last edited by Rockie Neighbours, [place] on 04/11/2020  3:45 PM. (History)      Referring physician: Domingo Mend Darrick Penna, MD No address on file  HISTORICAL INFORMATION:   Selected notes from the [facility]: No current outpatient medications on file. (Ophthalmic Drugs)   No current facility-administered medications for this visit. (Ophthalmic Drugs)   Current Outpatient Medications (Other)  Medication Sig  . amLODipine (NORVASC) 5 MG tablet Take by mouth.  Marland Kitchen eplerenone (INSPRA) 25 MG tablet Take 25 mg by mouth daily.  Marland Kitchen epoetin alfa (EPOGEN) 10000 UNIT/ML injection 10,000 Units every 14 (fourteen) days.  . hydroxychloroquine (PLAQUENIL) 200 MG tablet Take by mouth.  . losartan (COZAAR) 50 MG tablet Take by mouth.  . mirtazapine (REMERON) 30 MG tablet Take 30 mg by mouth at bedtime.  . mycophenolate (CELLCEPT) 500 MG tablet   . riTUXimab-abbs ([place]) 500 MG/50ML injection Inject into the vein.  Marland Kitchen zolpidem (AMBIEN) 5 MG tablet Take 5 mg by mouth at bedtime as needed.   No current facility-administered medications for this visit. (Other)      REVIEW OF SYSTEMS:    ALLERGIES Allergies  Allergen Reactions  . Prednisone Swelling    Swelling behind the eye that causes not being able to see    PAST MEDICAL HISTORY Past Medical History:  Diagnosis Date  . Anemia in chronic kidney disease (CKD)   . Chronic  kidney disease   . Hypertension    Past Surgical History:  Procedure Laterality Date  . LASIK Bilateral 1999    FAMILY HISTORY Family History  Problem Relation Age of Onset  . Diabetes Mother   . Hypertension Father   . Diabetes Father     SOCIAL HISTORY Social History   Tobacco Use  . Smoking status: Never Smoker  . Smokeless tobacco: Never Used  Substance Use Topics  . Alcohol use: Not on file  . Drug use: Not on file         OPHTHALMIC EXAM:  Base Eye Exam    Visual Acuity (ETDRS)      Right Left   Dist [place] 20/30 -2 20/20 -2   Dist ph [place] NI        Tonometry (Tonopen, 3:46 PM)      Right Left   Pressure 13 11       Pupils      Pupils Dark Light Shape React APD   Right PERRL 4 3 Round Brisk None   Left PERRL 4 3 Round Brisk None       Visual Fields (Counting fingers)      Left Right    Full Full       Extraocular Movement  Right Left    Full Full       Neuro/Psych    Oriented x3: Yes   Mood/Affect: Normal       Dilation    Right eye: 1.0% Mydriacyl, 2.5% Phenylephrine @ 3:49 PM        Slit Lamp and Fundus Exam    External Exam      Right Left   External Normal Normal       Slit Lamp Exam      Right Left   Lids/Lashes Normal Normal   Conjunctiva/Sclera White and quiet White and quiet   Cornea Clear Clear   Anterior Chamber Deep and quiet Deep and quiet   Iris Round and reactive Round and reactive   Lens 1+ Nuclear sclerosis 1+ Nuclear sclerosis   Anterior Vitreous Normal Normal       Fundus Exam      Right Left   Posterior Vitreous Posterior vitreous detachment    Disc Normal    C/D Ratio 0.5    Macula Disciform scar, Retinal pigment epithelial mottling, Retinal pigment epithelial atrophy, no hemorrhage, no exudates    Vessels Normal    Periphery Normal           IMAGING AND PROCEDURES  Imaging and Procedures for 04/11/20  OCT, Retina - OU - Both Eyes       Right Eye Quality was good. Scan locations  included subfoveal. Central Foveal Thickness: 324. Progression has been stable. Findings include abnormal foveal contour, disciform scar, no SRF.   Left Eye Quality was good. Scan locations included subfoveal. Central Foveal Thickness: 264. Findings include retinal drusen .   Notes OD subfoveal disciform scar, stable and small region of intraretinal fluid superiorly has Overall stabilized on intravitreal Eylea.  Will repeat injection today and examination in 7 to 8 weeks       Intravitreal Injection, Pharmacologic Agent - OD - Right Eye       Time Out 04/11/2020. 4:19 PM. Confirmed correct patient, procedure, site, and patient consented.   Anesthesia Topical anesthesia was used. Anesthetic medications included Akten 3.5%.   Procedure Preparation included Tobramycin 0.3%, 5% betadine to ocular surface, 10% betadine to eyelids. A 30 gauge needle was used.   Injection:  2 mg aflibercept [name]   NDC: A3590391, Lot: [id number]   Route: Intravitreal, Site: Right Eye, Waste: 0 mg  Post-op Post injection exam found visual acuity of at least counting fingers. The patient tolerated the procedure well. There were no complications. The patient received written and verbal post procedure care education.                 ASSESSMENT/PLAN:  Exudative age-related macular degeneration of right eye with active choroidal neovascularization (HCC) The nature of wet macular degeneration was discussed with the patient.  Forms of therapy reviewed include the use of Anti-VEGF medications injected painlessly into the eye, as well as other possible treatment modalities, including thermal laser therapy. Fellow eye involvement and risks were discussed with the patient. Upon the finding of wet age related macular degeneration, treatment will be offered. The treatment regimen is on a treat as needed basis with the intent to treat if necessary and extend interval of exams when possible. On average 1  out of 6 patients do not need lifetime therapy. However, the risk of recurrent disease is high for a lifetime.  Initially monthly, then periodic, examinations and evaluations will determine whether the next treatment is required on the day of  the examination.  OD, stable lesion and improved overall at 7-week interval follow-up today post Eylea injection, repeat injection examination in 8 weeks      ICD-10-CM   1. Exudative age-related macular degeneration of right eye with active choroidal neovascularization (HCC)  H35.3211 OCT, Retina - OU - Both Eyes    Intravitreal Injection, Pharmacologic Agent - OD - Right Eye    aflibercept (EYLEA) SOLN 2 mg    1.  2.  3.  Ophthalmic Meds Ordered this visit:  Meds ordered this encounter  Medications  . aflibercept (EYLEA) SOLN 2 mg       Return in about 8 weeks (around 06/06/2020) for dilate, OD, EYLEA OCT.  There are no Patient Instructions on file for this visit.   Explained the diagnoses, plan, and follow up with the patient and they expressed understanding.  Patient expressed understanding of the importance of proper follow up care.   Clent Demark Emmamarie Kluender M.D. Diseases & Surgery of the Retina and Vitreous Retina & Diabetic [facility] 04/11/20     Abbreviations: M myopia (nearsighted); A astigmatism; H hyperopia (farsighted); P presbyopia; Mrx spectacle prescription;  CTL contact lenses; OD right eye; OS left eye; OU both eyes  XT exotropia; ET esotropia; PEK punctate epithelial keratitis; PEE punctate epithelial erosions; DES dry eye syndrome; MGD meibomian gland dysfunction; ATs artificial tears; PFAT's preservative free artificial tears; [place] nuclear sclerotic cataract; PSC posterior subcapsular cataract; ERM epi-retinal membrane; PVD posterior vitreous detachment; RD retinal detachment; DM diabetes mellitus; DR diabetic retinopathy; NPDR non-proliferative diabetic retinopathy; PDR proliferative diabetic retinopathy; CSME clinically  significant macular edema; DME diabetic macular edema; dbh dot blot hemorrhages; CWS cotton wool spot; POAG primary open angle glaucoma; C/D cup-to-disc ratio; HVF humphrey visual field; GVF goldmann visual field; OCT optical coherence tomography; IOP intraocular pressure; BRVO Branch retinal vein occlusion; CRVO central retinal vein occlusion; CRAO central retinal artery occlusion; BRAO branch retinal artery occlusion; RT retinal tear; SB scleral buckle; PPV pars plana vitrectomy; VH Vitreous hemorrhage; PRP panretinal laser photocoagulation; IVK intravitreal kenalog; VMT vitreomacular traction; MH Macular hole;  NVD neovascularization of the disc; NVE neovascularization elsewhere; AREDS age related eye disease study; ARMD age related macular degeneration; POAG primary open angle glaucoma; EBMD epithelial/anterior basement membrane dystrophy; ACIOL anterior chamber intraocular lens; IOL intraocular lens; PCIOL posterior chamber intraocular lens; Phaco/IOL phacoemulsification with intraocular lens placement; [place] photorefractive keratectomy; LASIK laser assisted in situ keratomileusis; HTN hypertension; DM diabetes mellitus; COPD chronic obstructive pulmonary disease

## 2020-04-11 NOTE — Assessment & Plan Note (Signed)
The nature of wet macular degeneration was discussed with the patient.  Forms of therapy reviewed include the use of Anti-VEGF medications injected painlessly into the eye, as well as other possible treatment modalities, including thermal laser therapy. Fellow eye involvement and risks were discussed with the patient. Upon the finding of wet age related macular degeneration, treatment will be offered. The treatment regimen is on a treat as needed basis with the intent to treat if necessary and extend interval of exams when possible. On average 1 out of 6 patients do not need lifetime therapy. However, the risk of recurrent disease is high for a lifetime.  Initially monthly, then periodic, examinations and evaluations will determine whether the next treatment is required on the day of the examination.  OD, stable lesion and improved overall at 7-week interval follow-up today post Eylea injection, repeat injection examination in 8 weeks

## 2020-04-13 ENCOUNTER — Other Ambulatory Visit (HOSPITAL_COMMUNITY): Payer: Self-pay | Admitting: *Deleted

## 2020-04-14 ENCOUNTER — Other Ambulatory Visit: Payer: Self-pay

## 2020-04-14 ENCOUNTER — Encounter (HOSPITAL_COMMUNITY)
Admission: RE | Admit: 2020-04-14 | Discharge: 2020-04-14 | Disposition: A | Discharge status: Home / Self Care | Payer: Commercial Managed Care - PPO | Source: Ambulatory Visit | Attending: Nephrology | Admitting: Nephrology

## 2020-04-14 DIAGNOSIS — N185 Chronic kidney disease, stage 5: Secondary | ICD-10-CM | POA: Diagnosis present

## 2020-04-14 DIAGNOSIS — D631 Anemia in chronic kidney disease: Secondary | ICD-10-CM | POA: Diagnosis not present

## 2020-04-14 MED ORDER — SODIUM CHLORIDE 0.9 % IV SOLN
510.0000 mg | Freq: Once | INTRAVENOUS | Status: AC
  Administered 2020-04-14: 510 mg via INTRAVENOUS
  Filled 2020-04-14: qty 17

## 2020-04-14 MED ORDER — EPOETIN ALFA-EPBX 10000 UNIT/ML IJ SOLN
INTRAMUSCULAR | Status: AC
  Filled 2020-04-14: qty 2

## 2020-04-14 MED ORDER — EPOETIN ALFA-EPBX 10000 UNIT/ML IJ SOLN
20000.0000 [IU] | Freq: Once | INTRAMUSCULAR | Status: AC
  Administered 2020-04-14: 20000 [IU] via SUBCUTANEOUS

## 2020-04-15 LAB — POCT HEMOGLOBIN-HEMACUE: Hemoglobin: 8.9 g/dL — ABNORMAL LOW (ref 13.0–17.0)

## 2020-04-28 ENCOUNTER — Other Ambulatory Visit: Payer: Self-pay

## 2020-04-28 ENCOUNTER — Ambulatory Visit (HOSPITAL_COMMUNITY)
Admission: RE | Admit: 2020-04-28 | Discharge: 2020-04-28 | Disposition: A | Discharge status: Home / Self Care | Payer: Commercial Managed Care - PPO | Source: Ambulatory Visit | Attending: Nephrology | Admitting: Nephrology

## 2020-04-28 DIAGNOSIS — D631 Anemia in chronic kidney disease: Secondary | ICD-10-CM | POA: Diagnosis not present

## 2020-04-28 DIAGNOSIS — N185 Chronic kidney disease, stage 5: Secondary | ICD-10-CM | POA: Diagnosis present

## 2020-04-28 LAB — IRON AND TIBC
Iron: 70 ug/dL (ref 45–182)
Saturation Ratios: 25 % (ref 17.9–39.5)
TIBC: 284 ug/dL (ref 250–450)
UIBC: 214 ug/dL

## 2020-04-28 LAB — FERRITIN: Ferritin: 464 ng/mL — ABNORMAL HIGH (ref 24–336)

## 2020-04-28 LAB — POCT HEMOGLOBIN-HEMACUE: Hemoglobin: 9.2 g/dL — ABNORMAL LOW (ref 13.0–17.0)

## 2020-04-28 MED ORDER — EPOETIN ALFA-EPBX 10000 UNIT/ML IJ SOLN
INTRAMUSCULAR | Status: AC
  Filled 2020-04-28: qty 2

## 2020-04-28 MED ORDER — EPOETIN ALFA-EPBX 10000 UNIT/ML IJ SOLN
20000.0000 [IU] | INTRAMUSCULAR | Status: DC
  Administered 2020-04-28: 20000 [IU] via SUBCUTANEOUS

## 2020-05-12 ENCOUNTER — Encounter (HOSPITAL_COMMUNITY)
Admission: RE | Admit: 2020-05-12 | Discharge: 2020-05-12 | Disposition: A | Discharge status: Home / Self Care | Payer: Commercial Managed Care - PPO | Source: Ambulatory Visit | Attending: Nephrology | Admitting: Nephrology

## 2020-05-12 ENCOUNTER — Other Ambulatory Visit: Payer: Self-pay

## 2020-05-12 ENCOUNTER — Encounter (HOSPITAL_COMMUNITY): Payer: Commercial Managed Care - PPO

## 2020-05-12 DIAGNOSIS — D631 Anemia in chronic kidney disease: Secondary | ICD-10-CM | POA: Diagnosis not present

## 2020-05-12 DIAGNOSIS — N185 Chronic kidney disease, stage 5: Secondary | ICD-10-CM | POA: Insufficient documentation

## 2020-05-12 LAB — POCT HEMOGLOBIN-HEMACUE: Hemoglobin: 9.7 g/dL — ABNORMAL LOW (ref 13.0–17.0)

## 2020-05-12 MED ORDER — EPOETIN ALFA-EPBX 10000 UNIT/ML IJ SOLN: 20000.0000 [IU] | INTRAMUSCULAR | Status: DC

## 2020-05-12 MED ORDER — EPOETIN ALFA-EPBX 10000 UNIT/ML IJ SOLN
INTRAMUSCULAR | Status: AC
  Administered 2020-05-12: 20000 [IU] via SUBCUTANEOUS
  Filled 2020-05-12: qty 2

## 2020-05-13 ENCOUNTER — Inpatient Hospital Stay: Payer: Commercial Managed Care - PPO | Attending: Oncology | Admitting: Oncology

## 2020-05-13 ENCOUNTER — Other Ambulatory Visit: Payer: Self-pay

## 2020-05-13 VITALS — BP 149/95 | HR 73 | Temp 97.7°F | Resp 18 | Ht 70.0 in | Wt 177.2 lb

## 2020-05-13 DIAGNOSIS — D649 Anemia, unspecified: Secondary | ICD-10-CM

## 2020-05-13 DIAGNOSIS — I129 Hypertensive chronic kidney disease with stage 1 through stage 4 chronic kidney disease, or unspecified chronic kidney disease: Secondary | ICD-10-CM | POA: Insufficient documentation

## 2020-05-13 DIAGNOSIS — Z79899 Other long term (current) drug therapy: Secondary | ICD-10-CM | POA: Insufficient documentation

## 2020-05-13 DIAGNOSIS — F32A Depression, unspecified: Secondary | ICD-10-CM | POA: Diagnosis not present

## 2020-05-13 DIAGNOSIS — N189 Chronic kidney disease, unspecified: Secondary | ICD-10-CM | POA: Insufficient documentation

## 2020-05-13 DIAGNOSIS — M3214 Glomerular disease in systemic lupus erythematosus: Secondary | ICD-10-CM | POA: Diagnosis not present

## 2020-05-13 DIAGNOSIS — D631 Anemia in chronic kidney disease: Secondary | ICD-10-CM | POA: Insufficient documentation

## 2020-05-13 NOTE — Progress Notes (Signed)
Reason for the request:   Anemia  HPI: I was asked by Dr. Royce Macadamia to evaluate Jordan Hanna with a diagnosis of anemia.  He is a 59 year old man with history of lupus nephritis, hypertension as well as chronic renal insufficiency.  He gets his routine nephrology care at [facility] and was referred to to Dr. Royce Macadamia for the management of his renal failure and anemia locally.  He established care in July 2021 laboratory testing at that time showed iron level of 59, saturation of 22% with ferritin of 304 and his hemoglobin was 8.4.  He was started on Procrit and has been receiving it since February 18, 2020.  He has initially started at 10,000 units every 3 weeks and switched currently to 20,000 units.  He also received Feraheme infusion on 2 separate occasions with the last infusion given on April 14, 2020 at 510 mg.  Iron studies obtained on April 28, 2020 with iron level of 78 with ferritin of 464.  His hemoglobin has improved currently to 8.9 in September 23 and 9.7 on May 12, 2020.    He did have extensive hematological work-up in the past including a bone marrow biopsy without any clear-cut bone marrow disease.  This was before his lupus nephritis diagnosis.  Clinically, he reports no complaints at this time.  He is able to work full-time without any difficulty doing so.    He does not report any headaches, blurry vision, syncope or seizures. Does not report any fevers, chills or sweats.  Does not report any cough, wheezing or hemoptysis.  Does not report any chest pain, palpitation, orthopnea or leg edema.  Does not report any nausea, vomiting or abdominal pain.  Does not report any constipation or diarrhea.  Does not report any skeletal complaints.    Does not report frequency, urgency or hematuria.  Does not report any skin rashes or lesions. Does not report any heat or cold intolerance.  Does not report any lymphadenopathy or petechiae.  Does not report any anxiety or depression.  Remaining  review of systems is negative.    Past Medical History:  Diagnosis Date  . Anemia in chronic kidney disease (CKD)   . Chronic kidney disease   . Hypertension   :  Past Surgical History:  Procedure Laterality Date  . LASIK Bilateral 1999  :   Current Outpatient Medications:  .  amLODipine (NORVASC) 5 MG tablet, Take by mouth., Disp: , Rfl:  .  eplerenone (INSPRA) 25 MG tablet, Take 25 mg by mouth daily., Disp: , Rfl:  .  epoetin alfa (EPOGEN) 10000 UNIT/ML injection, 10,000 Units every 14 (fourteen) days., Disp: , Rfl:  .  hydroxychloroquine (PLAQUENIL) 200 MG tablet, Take by mouth., Disp: , Rfl:  .  losartan (COZAAR) 50 MG tablet, Take by mouth., Disp: , Rfl:  .  mirtazapine (REMERON) 30 MG tablet, Take 30 mg by mouth at bedtime., Disp: , Rfl:  .  mycophenolate (CELLCEPT) 500 MG tablet, , Disp: , Rfl:  .  riTUXimab-abbs (TRUXIMA) 500 MG/50ML injection, Inject into the vein., Disp: , Rfl:  .  zolpidem (AMBIEN) 5 MG tablet, Take 5 mg by mouth at bedtime as needed., Disp: , Rfl: :  Allergies  Allergen Reactions  . Prednisone Swelling    Swelling behind the eye that causes not being able to see  :  Family History  Problem Relation Age of Onset  . Diabetes Mother   . Hypertension Father   . Diabetes Father   :  Social History   Socioeconomic History  . Marital status: Unknown    Spouse name: Not on file  . Number of children: Not on file  . Years of education: Not on file  . Highest education level: Not on file  Occupational History  . Not on file  Tobacco Use  . Smoking status: Never Smoker  . Smokeless tobacco: Never Used  Substance and Sexual Activity  . Alcohol use: Not on file  . Drug use: Not on file  . Sexual activity: Not on file  Other Topics Concern  . Not on file  Social History Narrative  . Not on file   Social Determinants of Health   Financial Resource Strain:   . Difficulty of Paying Living Expenses: Not on file  Food Insecurity:   .  Worried About Charity fundraiser in the Last Year: Not on file  . Ran Out of Food in the Last Year: Not on file  Transportation Needs:   . Lack of Transportation (Medical): Not on file  . Lack of Transportation (Non-Medical): Not on file  Physical Activity:   . Days of Exercise per Week: Not on file  . Minutes of Exercise per Session: Not on file  Stress:   . Feeling of Stress : Not on file  Social Connections:   . Frequency of Communication with Friends and Family: Not on file  . Frequency of Social Gatherings with Friends and Family: Not on file  . Attends Religious Services: Not on file  . Active Member of Clubs or Organizations: Not on file  . Attends Archivist Meetings: Not on file  . Marital Status: Not on file  Intimate Partner Violence:   . Fear of Current or Ex-Partner: Not on file  . Emotionally Abused: Not on file  . Physically Abused: Not on file  . Sexually Abused: Not on file  :  Pertinent items are noted in HPI.  Exam:  ECOG General appearance: alert and cooperative appeared without distress. Head: atraumatic without any abnormalities. Eyes: conjunctivae/corneas clear. PERRL.  Sclera anicteric. Throat: lips, mucosa, and tongue normal; without oral thrush or ulcers. Resp: clear to auscultation bilaterally without rhonchi, wheezes or dullness to percussion. Cardio: regular rate and rhythm, S1, S2 normal, no murmur, click, rub or gallop GI: soft, non-tender; bowel sounds normal; no masses,  no organomegaly Skin: Skin color, texture, turgor normal. No rashes or lesions Lymph nodes: Cervical, supraclavicular, and axillary nodes normal. Neurologic: Grossly normal without any motor, sensory or deep tendon reflexes. Musculoskeletal: No joint deformity or effusion.  Recent Labs    05/12/20 1447  HGB 9.7*      Assessment and Plan:    59 year old with:  39.  59 year old with anemia diagnosed in July 2021.  His hemoglobin is noted at 8.4 without  evidence of iron deficiency in the setting of chronic renal insufficiency.  His creatinine clearance is around 14 to 16 cc/min.  Previous work-up including a serum protein electrophoresis was negative.  The differential diagnosis of his anemia was reviewed.  Treatment options were reiterated.  Anemia of chronic renal disease as well as anemia of chronic disease of the likely etiology.  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  At this time, he appears to be responding to higher doses of Procrit with hemoglobin up to 9.7.  Iron studies appears to be adequate.  I recommended continuing Procrit at the current dose with consideration  of increasing the dose to 30,000 unit every 3 weeks if his hemoglobin drops in the future.  I see no need for any further hematological work-up at this time.  No need for transfusion or a repeat bone marrow biopsy.  I am happy to reevaluate this in the future if needed.  2.  Follow-up: He will continue to follow with his nephrologist regarding Procrit for the time being.  I am happy to see him in the future as needed.  45  minutes were dedicated to this visit. The time was spent on reviewing laboratory data, discussing treatment options, discussing differential diagnosis and answering questions regarding future plan.      A copy of this consult has been forwarded to the requesting physician.

## 2020-05-26 ENCOUNTER — Ambulatory Visit (HOSPITAL_COMMUNITY)
Admission: RE | Admit: 2020-05-26 | Discharge: 2020-05-26 | Disposition: A | Discharge status: Home / Self Care | Payer: Commercial Managed Care - PPO | Source: Ambulatory Visit | Attending: Nephrology | Admitting: Nephrology

## 2020-05-26 ENCOUNTER — Encounter (HOSPITAL_COMMUNITY): Payer: Commercial Managed Care - PPO

## 2020-05-26 ENCOUNTER — Other Ambulatory Visit: Payer: Self-pay

## 2020-05-26 DIAGNOSIS — N19 Unspecified kidney failure: Secondary | ICD-10-CM | POA: Diagnosis not present

## 2020-05-26 DIAGNOSIS — D638 Anemia in other chronic diseases classified elsewhere: Secondary | ICD-10-CM | POA: Insufficient documentation

## 2020-05-26 LAB — IRON AND TIBC
Iron: 64 ug/dL (ref 45–182)
Saturation Ratios: 23 % (ref 17.9–39.5)
TIBC: 279 ug/dL (ref 250–450)
UIBC: 215 ug/dL

## 2020-05-26 LAB — POCT HEMOGLOBIN-HEMACUE: Hemoglobin: 10 g/dL — ABNORMAL LOW (ref 13.0–17.0)

## 2020-05-26 LAB — FERRITIN: Ferritin: 322 ng/mL (ref 24–336)

## 2020-05-26 MED ORDER — EPOETIN ALFA-EPBX 10000 UNIT/ML IJ SOLN
INTRAMUSCULAR | Status: AC
  Filled 2020-05-26: qty 2

## 2020-05-26 MED ORDER — EPOETIN ALFA-EPBX 10000 UNIT/ML IJ SOLN
20000.0000 [IU] | INTRAMUSCULAR | Status: DC
  Administered 2020-05-26: 20000 [IU] via SUBCUTANEOUS

## 2020-06-06 ENCOUNTER — Other Ambulatory Visit: Payer: Self-pay

## 2020-06-06 ENCOUNTER — Ambulatory Visit (INDEPENDENT_AMBULATORY_CARE_PROVIDER_SITE_OTHER): Payer: Commercial Managed Care - PPO | Admitting: Ophthalmology

## 2020-06-06 ENCOUNTER — Encounter (INDEPENDENT_AMBULATORY_CARE_PROVIDER_SITE_OTHER): Payer: Self-pay | Admitting: Ophthalmology

## 2020-06-06 DIAGNOSIS — H353211 Exudative age-related macular degeneration, right eye, with active choroidal neovascularization: Secondary | ICD-10-CM

## 2020-06-06 DIAGNOSIS — H2511 Age-related nuclear cataract, right eye: Secondary | ICD-10-CM

## 2020-06-06 MED ORDER — AFLIBERCEPT 2MG/0.05ML IZ SOLN FOR KALEIDOSCOPE
2.0000 mg | INTRAVITREAL | Status: AC | PRN
  Administered 2020-06-06: 2 mg via INTRAVITREAL

## 2020-06-06 NOTE — Assessment & Plan Note (Signed)
Minor NS

## 2020-06-06 NOTE — Assessment & Plan Note (Addendum)
The nature of wet macular degeneration was discussed with the patient.  Forms of therapy reviewed include the use of Anti-VEGF medications injected painlessly into the eye, as well as other possible treatment modalities, including thermal laser therapy. Fellow eye involvement and risks were discussed with the patient. Upon the finding of wet age related macular degeneration, treatment will be offered. The treatment regimen is on a treat as needed basis with the intent to treat if necessary and extend interval of exams when possible. On average 1 out of 6 patients do not need lifetime therapy. However, the risk of recurrent disease is high for a lifetime.  Initially monthly, then periodic, examinations and evaluations will determine whether the next treatment is required on the day of the examination.  OD, CNVM, worse at 8-week follow-up, will repeat injection Eylea OD today   and examination in 6 to 7 weeks

## 2020-06-06 NOTE — Progress Notes (Signed)
06/06/2020     CHIEF COMPLAINT Patient presents for Retina Follow Up   HISTORY OF PRESENT ILLNESS: Jordan Hanna is a 59 y.o. male who presents to the clinic today for:   HPI    Retina Follow Up    Patient presents with  Wet AMD.  In right eye.  This started 8 weeks ago.  Severity is mild.  Duration of 8 weeks.  Since onset it is stable.          Comments    8 WK F/U OD, poss EYLEA OD  Stable vision, no new F/F        Last edited by Nichola Sizer D on 06/06/2020  3:49 PM. (History)      Referring physician: Dorthula Perfect, MD No address on file  HISTORICAL INFORMATION:   Selected notes from the [place]: No current outpatient medications on file. (Ophthalmic Drugs)   No current facility-administered medications for this visit. (Ophthalmic Drugs)   Current Outpatient Medications (Other)  Medication Sig   amLODipine (NORVASC) 5 MG tablet Take by mouth.   eplerenone (INSPRA) 25 MG tablet Take 25 mg by mouth daily.   epoetin alfa (EPOGEN) 10000 UNIT/ML injection 10,000 Units every 14 (fourteen) days.   hydroxychloroquine (PLAQUENIL) 200 MG tablet Take by mouth.   losartan (COZAAR) 50 MG tablet Take by mouth.   mirtazapine (REMERON) 30 MG tablet Take 30 mg by mouth at bedtime.   mycophenolate (CELLCEPT) 500 MG tablet    riTUXimab-abbs (TRUXIMA) 500 MG/50ML injection Inject into the vein.   zolpidem (AMBIEN) 5 MG tablet Take 5 mg by mouth at bedtime as needed.   No current facility-administered medications for this visit. (Other)      REVIEW OF SYSTEMS:    ALLERGIES Allergies  Allergen Reactions   Prednisone Swelling    Swelling behind the eye that causes not being able to see    PAST MEDICAL HISTORY Past Medical History:  Diagnosis Date   Anemia in chronic kidney disease (CKD)    Chronic kidney disease    Hypertension    Past Surgical History:  Procedure Laterality Date    LASIK Bilateral 1999    FAMILY HISTORY Family History  Problem Relation Age of Onset   Diabetes Mother    Hypertension Father    Diabetes Father     SOCIAL HISTORY Social History   Tobacco Use   Smoking status: Never Smoker   Smokeless tobacco: Never Used  Substance Use Topics   Alcohol use: Not on file   Drug use: Not on file         OPHTHALMIC EXAM:  Base Eye Exam    Visual Acuity (ETDRS)      Right Left   Dist [place] 20/40 +2 20/20   Dist ph [place] NI        Tonometry (Tonopen, 3:51 PM)      Right Left   Pressure 11 12       Pupils      Pupils Dark Light Shape React APD   Right PERRL 4 3 Round Brisk None   Left PERRL 4 3 Round Brisk None       Visual Fields (Counting fingers)      Left Right    Full Full       Extraocular Movement      Right Left    Full Full       Neuro/Psych  Oriented x3: Yes   Mood/Affect: Normal       Dilation    Right eye: 1.0% Mydriacyl, 2.5% Phenylephrine @ 3:51 PM        Slit Lamp and Fundus Exam    External Exam      Right Left   External Normal Normal       Slit Lamp Exam      Right Left   Lids/Lashes 1+ Dermatochalasis - upper lid 1+ Dermatochalasis - upper lid   Conjunctiva/Sclera White and quiet White and quiet   Cornea Clear Clear   Anterior Chamber Deep and quiet Deep and quiet   Iris Round and reactive Round and reactive   Lens 1+ Nuclear sclerosis 1+ Nuclear sclerosis   Anterior Vitreous Normal Normal       Fundus Exam      Right Left   Posterior Vitreous Posterior vitreous detachment    Disc Normal    C/D Ratio 0.5    Macula Disciform scar, Retinal pigment epithelial mottling, Retinal pigment epithelial atrophy, no hemorrhage, no exudates, Subretinal fibrosis    Vessels Normal    Periphery Normal           IMAGING AND PROCEDURES  Imaging and Procedures for 06/06/20  OCT, Retina - OU - Both Eyes       Right Eye Quality was good. Scan locations included subfoveal. Central Foveal  Thickness: 398. Progression has worsened. Findings include subretinal hyper-reflective material, intraretinal fluid, subretinal scarring.   Left Eye Quality was good. Scan locations included subfoveal. Central Foveal Thickness: 275. Progression has been stable. Findings include subretinal hyper-reflective material, retinal drusen .   Notes Subretinal scarring, intraretinal fluid and subretinal fluid increased today OD at 8-week follow-up       Intravitreal Injection, Pharmacologic Agent - OD - Right Eye       Time Out 06/06/2020. 4:35 PM. Confirmed correct patient, procedure, site, and patient consented.   Anesthesia Topical anesthesia was used. Anesthetic medications included Akten 3.5%.   Procedure Preparation included 10% betadine to eyelids, 5% betadine to ocular surface.   Injection:  2 mg aflibercept [name]   NDC: [id number], Lot: [id number]   Route: Intravitreal, Site: Right Eye, Waste: 0 mg  Post-op Post injection exam found visual acuity of at least counting fingers. The patient tolerated the procedure well. There were no complications. The patient received written and verbal post procedure care education.                 ASSESSMENT/PLAN:  Exudative age-related macular degeneration of right eye with active choroidal neovascularization (HCC) The nature of wet macular degeneration was discussed with the patient.  Forms of therapy reviewed include the use of Anti-VEGF medications injected painlessly into the eye, as well as other possible treatment modalities, including thermal laser therapy. Fellow eye involvement and risks were discussed with the patient. Upon the finding of wet age related macular degeneration, treatment will be offered. The treatment regimen is on a treat as needed basis with the intent to treat if necessary and extend interval of exams when possible. On average 1 out of 6 patients do not need lifetime therapy. However, the risk of  recurrent disease is high for a lifetime.  Initially monthly, then periodic, examinations and evaluations will determine whether the next treatment is required on the day of the examination.  OD, CNVM, worse at 8-week follow-up, will repeat injection Eylea OD today   and examination in 6 to 7 weeks  Nuclear  sclerotic cataract of right eye Minor NS      ICD-10-CM   1. Exudative age-related macular degeneration of right eye with active choroidal neovascularization (HCC)  H35.3211 OCT, Retina - OU - Both Eyes    Intravitreal Injection, Pharmacologic Agent - OD - Right Eye    aflibercept (EYLEA) SOLN 2 mg  2. Nuclear sclerotic cataract of right eye  H25.11     1.  CNVM OD worse today at extended interval of 8-week follow-up.  We will repeat injection Eylea and decrease interval examination to 6 weeks.  2.  Begin to plan coordination for possible kidney transplant in the early first quarter 2022, may in fact consider injection a short interval 5 weeks so that thereafter he might have extended 8 to 10 weeks possible follow-up interval  3.  Ophthalmic Meds Ordered this visit:  Meds ordered this encounter  Medications   aflibercept (EYLEA) SOLN 2 mg       Return in about 6 weeks (around 07/18/2020) for dilate, OD, EYLEA OCT.  There are no Patient Instructions on file for this visit.   Explained the diagnoses, plan, and follow up with the patient and they expressed understanding.  Patient expressed understanding of the importance of proper follow up care.   Clent Demark Mickenzie Stolar M.D. Diseases & Surgery of the Retina and Vitreous Retina & Diabetic [place] 06/06/20     Abbreviations: M myopia (nearsighted); A astigmatism; H hyperopia (farsighted); P presbyopia; Mrx spectacle prescription;  CTL contact lenses; OD right eye; OS left eye; OU both eyes  XT exotropia; ET esotropia; PEK punctate epithelial keratitis; PEE punctate epithelial erosions; DES dry eye syndrome; MGD meibomian gland  dysfunction; ATs artificial tears; PFAT's preservative free artificial tears; [place] nuclear sclerotic cataract; PSC posterior subcapsular cataract; ERM epi-retinal membrane; PVD posterior vitreous detachment; RD retinal detachment; DM diabetes mellitus; DR diabetic retinopathy; NPDR non-proliferative diabetic retinopathy; PDR proliferative diabetic retinopathy; CSME clinically significant macular edema; DME diabetic macular edema; dbh dot blot hemorrhages; CWS cotton wool spot; POAG primary open angle glaucoma; C/D cup-to-disc ratio; HVF humphrey visual field; GVF goldmann visual field; OCT optical coherence tomography; IOP intraocular pressure; BRVO Branch retinal vein occlusion; CRVO central retinal vein occlusion; CRAO central retinal artery occlusion; BRAO branch retinal artery occlusion; RT retinal tear; SB scleral buckle; PPV pars plana vitrectomy; VH Vitreous hemorrhage; PRP panretinal laser photocoagulation; IVK intravitreal kenalog; VMT vitreomacular traction; MH Macular hole;  NVD neovascularization of the disc; NVE neovascularization elsewhere; AREDS age related eye disease study; ARMD age related macular degeneration; POAG primary open angle glaucoma; EBMD epithelial/anterior basement membrane dystrophy; ACIOL anterior chamber intraocular lens; IOL intraocular lens; PCIOL posterior chamber intraocular lens; Phaco/IOL phacoemulsification with intraocular lens placement; [place] photorefractive keratectomy; LASIK laser assisted in situ keratomileusis; HTN hypertension; DM diabetes mellitus; COPD chronic obstructive pulmonary disease

## 2020-06-09 ENCOUNTER — Ambulatory Visit (HOSPITAL_COMMUNITY)
Admission: RE | Admit: 2020-06-09 | Discharge: 2020-06-09 | Disposition: A | Discharge status: Home / Self Care | Payer: Commercial Managed Care - PPO | Source: Ambulatory Visit | Attending: Nephrology | Admitting: Nephrology

## 2020-06-09 ENCOUNTER — Other Ambulatory Visit: Payer: Self-pay

## 2020-06-09 DIAGNOSIS — D631 Anemia in chronic kidney disease: Secondary | ICD-10-CM | POA: Diagnosis not present

## 2020-06-09 DIAGNOSIS — N185 Chronic kidney disease, stage 5: Secondary | ICD-10-CM | POA: Insufficient documentation

## 2020-06-09 LAB — POCT HEMOGLOBIN-HEMACUE: Hemoglobin: 10 g/dL — ABNORMAL LOW (ref 13.0–17.0)

## 2020-06-09 MED ORDER — EPOETIN ALFA-EPBX 10000 UNIT/ML IJ SOLN
INTRAMUSCULAR | Status: AC
  Administered 2020-06-09: 20000 [IU] via SUBCUTANEOUS
  Filled 2020-06-09: qty 2

## 2020-06-09 MED ORDER — EPOETIN ALFA-EPBX 10000 UNIT/ML IJ SOLN: 20000.0000 [IU] | INTRAMUSCULAR | Status: DC

## 2020-06-22 ENCOUNTER — Other Ambulatory Visit (HOSPITAL_COMMUNITY): Payer: Self-pay | Admitting: *Deleted

## 2020-06-23 ENCOUNTER — Encounter (HOSPITAL_COMMUNITY)
Admission: RE | Admit: 2020-06-23 | Discharge: 2020-06-23 | Disposition: A | Discharge status: Home / Self Care | Payer: Commercial Managed Care - PPO | Source: Ambulatory Visit | Attending: Nephrology | Admitting: Nephrology

## 2020-06-23 ENCOUNTER — Encounter (HOSPITAL_COMMUNITY): Payer: Commercial Managed Care - PPO

## 2020-06-23 ENCOUNTER — Other Ambulatory Visit: Payer: Self-pay

## 2020-06-23 ENCOUNTER — Encounter: Payer: Self-pay | Admitting: Nephrology

## 2020-06-23 DIAGNOSIS — N185 Chronic kidney disease, stage 5: Secondary | ICD-10-CM | POA: Diagnosis not present

## 2020-06-23 DIAGNOSIS — D631 Anemia in chronic kidney disease: Secondary | ICD-10-CM | POA: Insufficient documentation

## 2020-06-23 LAB — POCT HEMOGLOBIN-HEMACUE: Hemoglobin: 10.1 g/dL — ABNORMAL LOW (ref 13.0–17.0)

## 2020-06-23 MED ORDER — EPOETIN ALFA-EPBX 10000 UNIT/ML IJ SOLN
INTRAMUSCULAR | Status: AC
  Administered 2020-06-23: 20000 [IU] via SUBCUTANEOUS
  Filled 2020-06-23: qty 2

## 2020-06-23 MED ORDER — EPOETIN ALFA-EPBX 10000 UNIT/ML IJ SOLN: 20000.0000 [IU] | INTRAMUSCULAR | Status: DC

## 2020-06-23 MED ORDER — SODIUM CHLORIDE 0.9 % IV SOLN
510.0000 mg | Freq: Once | INTRAVENOUS | Status: AC
  Administered 2020-06-23: 510 mg via INTRAVENOUS
  Filled 2020-06-23: qty 510

## 2020-07-07 ENCOUNTER — Encounter (HOSPITAL_COMMUNITY): Payer: Commercial Managed Care - PPO

## 2020-07-08 ENCOUNTER — Other Ambulatory Visit: Payer: Self-pay

## 2020-07-08 ENCOUNTER — Encounter (HOSPITAL_COMMUNITY)
Admission: RE | Admit: 2020-07-08 | Discharge: 2020-07-08 | Disposition: A | Discharge status: Home / Self Care | Payer: Commercial Managed Care - PPO | Source: Ambulatory Visit | Attending: Nephrology | Admitting: Nephrology

## 2020-07-08 VITALS — BP 151/96 | HR 83 | Temp 98.0°F | Resp 18

## 2020-07-08 DIAGNOSIS — N185 Chronic kidney disease, stage 5: Secondary | ICD-10-CM | POA: Diagnosis not present

## 2020-07-08 DIAGNOSIS — D631 Anemia in chronic kidney disease: Secondary | ICD-10-CM

## 2020-07-08 LAB — IRON AND TIBC
Iron: 95 ug/dL (ref 45–182)
Saturation Ratios: 36 % (ref 17.9–39.5)
TIBC: 267 ug/dL (ref 250–450)
UIBC: 172 ug/dL

## 2020-07-08 LAB — FERRITIN: Ferritin: 763 ng/mL — ABNORMAL HIGH (ref 24–336)

## 2020-07-08 MED ORDER — EPOETIN ALFA-EPBX 10000 UNIT/ML IJ SOLN
20000.0000 [IU] | INTRAMUSCULAR | Status: DC
  Administered 2020-07-08: 20000 [IU] via SUBCUTANEOUS

## 2020-07-08 MED ORDER — EPOETIN ALFA-EPBX 10000 UNIT/ML IJ SOLN
INTRAMUSCULAR | Status: AC
  Filled 2020-07-08: qty 2

## 2020-07-11 LAB — POCT HEMOGLOBIN-HEMACUE: Hemoglobin: 10.3 g/dL — ABNORMAL LOW (ref 13.0–17.0)

## 2020-07-14 ENCOUNTER — Encounter (INDEPENDENT_AMBULATORY_CARE_PROVIDER_SITE_OTHER): Payer: Self-pay | Admitting: Ophthalmology

## 2020-07-14 ENCOUNTER — Other Ambulatory Visit: Payer: Self-pay

## 2020-07-14 ENCOUNTER — Ambulatory Visit (INDEPENDENT_AMBULATORY_CARE_PROVIDER_SITE_OTHER): Payer: Commercial Managed Care - PPO | Admitting: Ophthalmology

## 2020-07-14 DIAGNOSIS — H353211 Exudative age-related macular degeneration, right eye, with active choroidal neovascularization: Secondary | ICD-10-CM

## 2020-07-14 DIAGNOSIS — H35712 Central serous chorioretinopathy, left eye: Secondary | ICD-10-CM

## 2020-07-14 MED ORDER — AFLIBERCEPT 2MG/0.05ML IZ SOLN FOR KALEIDOSCOPE
2.0000 mg | INTRAVITREAL | Status: AC | PRN
  Administered 2020-07-14: 2 mg via INTRAVITREAL

## 2020-07-14 NOTE — Patient Instructions (Signed)
Patient instructed to notify the office first business day should left eye have or experience blurring of vision

## 2020-07-14 NOTE — Assessment & Plan Note (Signed)
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

## 2020-07-14 NOTE — Progress Notes (Signed)
07/14/2020     CHIEF COMPLAINT Patient presents for Retina Follow Up (6 WK FU OD, POSS EYLEA OD////Pt reports stable vision OD, no new f/f OD, no pain or pressure OD. )   HISTORY OF PRESENT ILLNESS: Jordan Hanna is a 59 y.o. male who presents to the clinic today for:   HPI    Retina Follow Up    Patient presents with  Wet AMD.  In right eye.  This started 6 weeks ago.  Duration of 6 weeks.  Since onset it is stable. Additional comments: 6 WK FU OD, POSS EYLEA OD    Pt reports stable vision OD, no new f/f OD, no pain or pressure OD.        Last edited by Nichola Sizer D on 07/14/2020  8:53 AM. (History)      Referring physician: Dorthula Perfect, MD No address on file  HISTORICAL INFORMATION:   Selected notes from the [facility]: No current outpatient medications on file. (Ophthalmic Drugs)   No current facility-administered medications for this visit. (Ophthalmic Drugs)   Current Outpatient Medications (Other)  Medication Sig  . amLODipine (NORVASC) 5 MG tablet Take by mouth.  Marland Kitchen eplerenone (INSPRA) 25 MG tablet Take 25 mg by mouth daily.  Marland Kitchen epoetin alfa (EPOGEN) 10000 UNIT/ML injection 10,000 Units every 14 (fourteen) days.  . hydroxychloroquine (PLAQUENIL) 200 MG tablet Take by mouth.  . losartan (COZAAR) 50 MG tablet Take by mouth.  . mirtazapine (REMERON) 30 MG tablet Take 30 mg by mouth at bedtime.  . mycophenolate (CELLCEPT) 500 MG tablet   . riTUXimab-abbs ([place]) 500 MG/50ML injection Inject into the vein.  Marland Kitchen zolpidem (AMBIEN) 5 MG tablet Take 5 mg by mouth at bedtime as needed.   No current facility-administered medications for this visit. (Other)      REVIEW OF SYSTEMS:    ALLERGIES Allergies  Allergen Reactions  . Prednisone Swelling    Swelling behind the eye that causes not being able to see    PAST MEDICAL HISTORY Past Medical History:  Diagnosis Date  . Anemia in chronic kidney  disease (CKD)   . Chronic kidney disease   . Hypertension    Past Surgical History:  Procedure Laterality Date  . LASIK Bilateral 1999    FAMILY HISTORY Family History  Problem Relation Age of Onset  . Diabetes Mother   . Hypertension Father   . Diabetes Father     SOCIAL HISTORY Social History   Tobacco Use  . Smoking status: Never Smoker  . Smokeless tobacco: Never Used         OPHTHALMIC EXAM:  Base Eye Exam    Visual Acuity (ETDRS)      Right Left   Dist [place] 20/40 -1 20/25 +2   Dist ph [place] NI        Tonometry (Tonopen, 8:59 AM)      Right Left   Pressure 9 9       Pupils      Pupils Dark Light Shape React APD   Right PERRL 4 3 Round Brisk None   Left PERRL 4 3 Round Brisk None       Visual Fields (Counting fingers)      Left Right    Full Full       Extraocular Movement      Right Left    Full Full       Neuro/Psych  Oriented x3: Yes   Mood/Affect: Normal       Dilation    Right eye: 1.0% Mydriacyl, 2.5% Phenylephrine @ 8:59 AM        Slit Lamp and Fundus Exam    External Exam      Right Left   External Normal Normal       Slit Lamp Exam      Right Left   Lids/Lashes 1+ Dermatochalasis - upper lid 1+ Dermatochalasis - upper lid   Conjunctiva/Sclera White and quiet White and quiet   Cornea Clear Clear   Anterior Chamber Deep and quiet Deep and quiet   Iris Round and reactive Round and reactive   Lens 1+ Nuclear sclerosis 1+ Nuclear sclerosis   Anterior Vitreous Normal Normal       Fundus Exam      Right Left   Posterior Vitreous Posterior vitreous detachment    Disc Normal    C/D Ratio 0.5    Macula Disciform scar, Retinal pigment epithelial mottling, Retinal pigment epithelial atrophy, no hemorrhage, no exudates, Subretinal fibrosis    Vessels Normal    Periphery Normal           IMAGING AND PROCEDURES  Imaging and Procedures for 07/14/20  OCT, Retina - OU - Both Eyes       Right Eye Quality was good. Scan  locations included subfoveal. Central Foveal Thickness: 371. Progression has improved. Findings include subretinal hyper-reflective material, cystoid macular edema, choroidal neovascular membrane, intraretinal fluid.   Left Eye Quality was good. Scan locations included subfoveal. Central Foveal Thickness: 274. Progression has been stable. Findings include abnormal foveal contour, subretinal fluid, subretinal hyper-reflective material.   Notes OD improved status post intravitreal Eylea some 5 weeks previous.  Active CNVM temporal edge  We will repeat intravitreal Eylea today and examination again in 5 weeks   OS with new serous subretinal fluid temporal aspect of the macula, not threatening fovea at this time will observe       Intravitreal Injection, Pharmacologic Agent - OD - Right Eye       Time Out 07/14/2020. 9:58 AM. Confirmed correct patient, procedure, site, and patient consented.   Anesthesia Topical anesthesia was used. Anesthetic medications included [place] 3.5%.   Procedure Preparation included 10% betadine to eyelids, 5% betadine to ocular surface.   Injection:  2 mg aflibercept [name]   NDC: [id number], Lot: [id number]   Route: Intravitreal, Site: Right Eye, Waste: 0 mg  Post-op Post injection exam found visual acuity of at least counting fingers. The patient tolerated the procedure well. There were no complications. The patient received written and verbal post procedure care education.                 ASSESSMENT/PLAN:  Central serous retinopathy, left We will choose to observe the new serous retinal detachment temporal aspect of the macula left eye, nonfoveal involved.  Exudative age-related macular degeneration of right eye with active choroidal neovascularization (HCC) Recent reactivation of CNVM temporal aspect of the prior lesion OD, now improved post Eylea at 5-week interval today with less subretinal fluid, will repeat injection today and  examination again in 5 weeks      ICD-10-CM   1. Exudative age-related macular degeneration of right eye with active choroidal neovascularization (HCC)  H35.3211 OCT, Retina - OU - Both Eyes    Intravitreal Injection, Pharmacologic Agent - OD - Right Eye    aflibercept (EYLEA) SOLN 2 mg  2. Central serous retinopathy,  left  H35.712     1.  Repeat Eylea injection OD today at 5-week interval  2.  OS, with new serous retinal detachment left eye, while patient not taking systemic or topical steroids.  3.  Dilate OU next and monitor for visual acuity changes in each eye.  Ophthalmic Meds Ordered this visit:  Meds ordered this encounter  Medications  . aflibercept (EYLEA) SOLN 2 mg       Return in about 5 weeks (around 08/18/2020) for DILATE OU, EYLEA OCT, OD.  Patient Instructions  Patient instructed to notify the office first business day should left eye have or experience blurring of vision    Explained the diagnoses, plan, and follow up with the patient and they expressed understanding.  Patient expressed understanding of the importance of proper follow up care.   Clent Demark Chenay Nesmith M.D. Diseases & Surgery of the Retina and Vitreous Retina & Diabetic [place] 07/14/20     Abbreviations: M myopia (nearsighted); A astigmatism; H hyperopia (farsighted); P presbyopia; Mrx spectacle prescription;  CTL contact lenses; OD right eye; OS left eye; OU both eyes  XT exotropia; ET esotropia; PEK punctate epithelial keratitis; PEE punctate epithelial erosions; DES dry eye syndrome; MGD meibomian gland dysfunction; ATs artificial tears; PFAT's preservative free artificial tears; [place] nuclear sclerotic cataract; PSC posterior subcapsular cataract; ERM epi-retinal membrane; PVD posterior vitreous detachment; RD retinal detachment; DM diabetes mellitus; DR diabetic retinopathy; NPDR non-proliferative diabetic retinopathy; PDR proliferative diabetic retinopathy; CSME clinically significant macular  edema; DME diabetic macular edema; dbh dot blot hemorrhages; CWS cotton wool spot; POAG primary open angle glaucoma; C/D cup-to-disc ratio; HVF humphrey visual field; GVF goldmann visual field; OCT optical coherence tomography; IOP intraocular pressure; BRVO Branch retinal vein occlusion; CRVO central retinal vein occlusion; CRAO central retinal artery occlusion; BRAO branch retinal artery occlusion; RT retinal tear; SB scleral buckle; PPV pars plana vitrectomy; VH Vitreous hemorrhage; PRP panretinal laser photocoagulation; IVK intravitreal kenalog; VMT vitreomacular traction; MH Macular hole;  NVD neovascularization of the disc; NVE neovascularization elsewhere; AREDS age related eye disease study; ARMD age related macular degeneration; POAG primary open angle glaucoma; EBMD epithelial/anterior basement membrane dystrophy; ACIOL anterior chamber intraocular lens; IOL intraocular lens; PCIOL posterior chamber intraocular lens; Phaco/IOL phacoemulsification with intraocular lens placement; [place] photorefractive keratectomy; LASIK laser assisted in situ keratomileusis; HTN hypertension; DM diabetes mellitus; COPD chronic obstructive pulmonary disease

## 2020-07-14 NOTE — Assessment & Plan Note (Signed)
Recent reactivation of CNVM temporal aspect of the prior lesion OD, now improved post Eylea at 5-week interval today with less subretinal fluid, will repeat injection today and examination again in 5 weeks

## 2020-07-18 ENCOUNTER — Encounter (INDEPENDENT_AMBULATORY_CARE_PROVIDER_SITE_OTHER): Payer: Commercial Managed Care - PPO | Admitting: Ophthalmology

## 2020-07-21 ENCOUNTER — Inpatient Hospital Stay (HOSPITAL_COMMUNITY): Admission: RE | Admit: 2020-07-21 | Payer: Commercial Managed Care - PPO | Source: Ambulatory Visit

## 2020-07-23 HISTORY — PX: PROSTATE BIOPSY: SHX241

## 2020-07-23 HISTORY — PX: OTHER SURGICAL HISTORY: SHX169

## 2020-08-01 ENCOUNTER — Encounter (INDEPENDENT_AMBULATORY_CARE_PROVIDER_SITE_OTHER): Payer: Commercial Managed Care - PPO | Admitting: Ophthalmology

## 2020-08-04 ENCOUNTER — Other Ambulatory Visit: Payer: Self-pay

## 2020-08-04 ENCOUNTER — Ambulatory Visit (HOSPITAL_COMMUNITY)
Admission: RE | Admit: 2020-08-04 | Discharge: 2020-08-04 | Disposition: A | Discharge status: Home / Self Care | Payer: Commercial Managed Care - PPO | Source: Ambulatory Visit | Attending: Nephrology | Admitting: Nephrology

## 2020-08-04 VITALS — BP 146/108 | HR 76 | Temp 98.0°F | Resp 18

## 2020-08-04 DIAGNOSIS — D631 Anemia in chronic kidney disease: Secondary | ICD-10-CM | POA: Insufficient documentation

## 2020-08-04 DIAGNOSIS — N185 Chronic kidney disease, stage 5: Secondary | ICD-10-CM | POA: Insufficient documentation

## 2020-08-04 LAB — POCT HEMOGLOBIN-HEMACUE: Hemoglobin: 10.2 g/dL — ABNORMAL LOW (ref 13.0–17.0)

## 2020-08-04 LAB — FERRITIN: Ferritin: 615 ng/mL — ABNORMAL HIGH (ref 24–336)

## 2020-08-04 LAB — IRON AND TIBC
Iron: 86 ug/dL (ref 45–182)
Saturation Ratios: 32 % (ref 17.9–39.5)
TIBC: 267 ug/dL (ref 250–450)
UIBC: 181 ug/dL

## 2020-08-04 MED ORDER — EPOETIN ALFA-EPBX 10000 UNIT/ML IJ SOLN
INTRAMUSCULAR | Status: AC
  Administered 2020-08-04: 20000 [IU] via SUBCUTANEOUS
  Filled 2020-08-04: qty 2

## 2020-08-04 MED ORDER — EPOETIN ALFA-EPBX 10000 UNIT/ML IJ SOLN: 20000.0000 [IU] | INTRAMUSCULAR | Status: DC

## 2020-08-18 ENCOUNTER — Ambulatory Visit (INDEPENDENT_AMBULATORY_CARE_PROVIDER_SITE_OTHER): Payer: Commercial Managed Care - PPO | Admitting: Ophthalmology

## 2020-08-18 ENCOUNTER — Other Ambulatory Visit: Payer: Self-pay

## 2020-08-18 ENCOUNTER — Encounter (HOSPITAL_COMMUNITY)
Admission: RE | Admit: 2020-08-18 | Discharge: 2020-08-18 | Disposition: A | Discharge status: Home / Self Care | Payer: Commercial Managed Care - PPO | Source: Ambulatory Visit | Attending: Nephrology | Admitting: Nephrology

## 2020-08-18 ENCOUNTER — Encounter (INDEPENDENT_AMBULATORY_CARE_PROVIDER_SITE_OTHER): Payer: Self-pay | Admitting: Ophthalmology

## 2020-08-18 VITALS — BP 137/99 | HR 93 | Resp 20

## 2020-08-18 DIAGNOSIS — H35712 Central serous chorioretinopathy, left eye: Secondary | ICD-10-CM | POA: Diagnosis not present

## 2020-08-18 DIAGNOSIS — H353211 Exudative age-related macular degeneration, right eye, with active choroidal neovascularization: Secondary | ICD-10-CM

## 2020-08-18 DIAGNOSIS — D631 Anemia in chronic kidney disease: Secondary | ICD-10-CM | POA: Insufficient documentation

## 2020-08-18 DIAGNOSIS — H35351 Cystoid macular degeneration, right eye: Secondary | ICD-10-CM | POA: Diagnosis not present

## 2020-08-18 DIAGNOSIS — N185 Chronic kidney disease, stage 5: Secondary | ICD-10-CM | POA: Diagnosis not present

## 2020-08-18 LAB — POCT HEMOGLOBIN-HEMACUE: Hemoglobin: 10 g/dL — ABNORMAL LOW (ref 13.0–17.0)

## 2020-08-18 MED ORDER — EPOETIN ALFA-EPBX 10000 UNIT/ML IJ SOLN
INTRAMUSCULAR | Status: AC
  Administered 2020-08-18: 20000 [IU] via SUBCUTANEOUS
  Filled 2020-08-18: qty 2

## 2020-08-18 MED ORDER — EPOETIN ALFA-EPBX 10000 UNIT/ML IJ SOLN: 20000.0000 [IU] | INTRAMUSCULAR | Status: DC

## 2020-08-18 NOTE — Assessment & Plan Note (Signed)
Repeat injection intravitreal Eylea today to maintain.  Currently at 5-week interval.  Repeat examination right eye in 5 weeks

## 2020-08-18 NOTE — Assessment & Plan Note (Addendum)
New worsening of subfoveal serous fluid in the macula and no longer seen temporally OS  Follow-up in 1 to 2 weeks left eye for fluorescein angiography to look for treatable site of leakage

## 2020-08-18 NOTE — Progress Notes (Addendum)
08/18/2020     CHIEF COMPLAINT Patient presents for Retina Follow Up (5 Week F/U OU, poss Eylea OD//Pt c/o "sun blind" OS x 4 days. Pt sts he was driving home from [place] with the sun in his eyes, and sts he cannot see as well since then out of OS x 4 days. Pt reports blue haze and some distortion OS, but sts it has improved slightly over the past couple days. Pt sts he was wearing sunglasses when he was driving. No new symptoms OD.)   HISTORY OF PRESENT ILLNESS: Jordan Hanna is a 60 y.o. male who presents to the clinic today for:   HPI    Retina Follow Up    Patient presents with  Wet AMD.  In right eye.  This started 5 weeks ago.  Severity is mild.  Duration of 5 weeks.  Since onset it is stable. Additional comments: 5 Week F/U OU, poss Eylea OD  Pt c/o "sun blind" OS x 4 days. Pt sts he was driving home from [place] with the sun in his eyes, and sts he cannot see as well since then out of OS x 4 days. Pt reports blue haze and some distortion OS, but sts it has improved slightly over the past couple days. Pt sts he was wearing sunglasses when he was driving. No new symptoms OD.       Last edited by Rockie Neighbours, [place] on 08/18/2020  4:03 PM. (History)      Referring physician: Dorthula Perfect, MD No address on file  HISTORICAL INFORMATION:   Selected notes from the [facility]: No current outpatient medications on file. (Ophthalmic Drugs)   No current facility-administered medications for this visit. (Ophthalmic Drugs)   Current Outpatient Medications (Other)  Medication Sig  . amLODipine (NORVASC) 5 MG tablet Take by mouth.  Marland Kitchen eplerenone (INSPRA) 25 MG tablet Take 25 mg by mouth daily.  Marland Kitchen epoetin alfa (EPOGEN) 10000 UNIT/ML injection 10,000 Units every 14 (fourteen) days.  . hydroxychloroquine (PLAQUENIL) 200 MG tablet Take by mouth.  . losartan (COZAAR) 50 MG tablet Take by mouth.  . mirtazapine (REMERON) 30 MG tablet Take  30 mg by mouth at bedtime.  . mycophenolate (CELLCEPT) 500 MG tablet   . riTUXimab-abbs ([place]) 500 MG/50ML injection Inject into the vein.  Marland Kitchen zolpidem (AMBIEN) 5 MG tablet Take 5 mg by mouth at bedtime as needed.   No current facility-administered medications for this visit. (Other)   Facility-Administered Medications Ordered in Other Visits (Other)  Medication Route  . epoetin alfa-epbx (RETACRIT) injection 20,000 Units Subcutaneous      REVIEW OF SYSTEMS:    ALLERGIES Allergies  Allergen Reactions  . Prednisone Swelling    Swelling behind the eye that causes not being able to see    PAST MEDICAL HISTORY Past Medical History:  Diagnosis Date  . Anemia in chronic kidney disease (CKD)   . Chronic kidney disease   . Hypertension    Past Surgical History:  Procedure Laterality Date  . LASIK Bilateral 1999    FAMILY HISTORY Family History  Problem Relation Age of Onset  . Diabetes Mother   . Hypertension Father   . Diabetes Father     SOCIAL HISTORY Social History   Tobacco Use  . Smoking status: Never Smoker  . Smokeless tobacco: Never Used         OPHTHALMIC EXAM: Base Eye Exam    Visual  Acuity (ETDRS)      Right Left   Dist [place] 20/70 +2 20/30   Dist ph [place] 20/40 -2 NI       Tonometry (Tonopen, 3:50 PM)      Right Left   Pressure 14 17       Pupils      Pupils Dark Light Shape React APD   Right PERRL 4 3 Round Brisk None   Left PERRL 4 3 Round Brisk None       Visual Fields (Counting fingers)      Left Right    Full Full       Extraocular Movement      Right Left    Full Full       Neuro/Psych    Oriented x3: Yes   Mood/Affect: Normal       Dilation    Both eyes: 1.0% Mydriacyl, 2.5% Phenylephrine @ 3:56 PM        Slit Lamp and Fundus Exam    External Exam      Right Left   External Normal Normal       Slit Lamp Exam      Right Left   Lids/Lashes 1+ Dermatochalasis - upper lid 1+ Dermatochalasis - upper lid    Conjunctiva/Sclera White and quiet White and quiet   Cornea Clear Clear   Anterior Chamber Deep and quiet Deep and quiet   Iris Round and reactive Round and reactive   Lens 1+ Nuclear sclerosis 1+ Nuclear sclerosis   Anterior Vitreous Normal Normal       Fundus Exam      Right Left   Posterior Vitreous Posterior vitreous detachment Normal   Disc Normal Normal   C/D Ratio 0.5 0.5   Macula Disciform scar, Retinal pigment epithelial mottling, Retinal pigment epithelial atrophy, no hemorrhage, no exudates, Subretinal fibrosis Nonturbid subretinal fluid   Vessels Normal Normal   Periphery Normal Normal          IMAGING AND PROCEDURES  Imaging and Procedures for 08/18/20  Hemoglobin-hemacue, POC     Component Value Flag Ref Range Units Status   Hemoglobin 10.0      13.0 - 17.0 g/dL Final        OCT, Retina - OU - Both Eyes       Right Eye Quality was good. Scan locations included subfoveal. Central Foveal Thickness: 381. Progression has been stable. Findings include disciform scar, cystoid macular edema, intraretinal fluid.   Left Eye Quality was good. Scan locations included subfoveal. Central Foveal Thickness: 413. Progression has worsened. Findings include vitreomacular adhesion .   Notes OD with chronic active CNVM and intraretinal fluid which is responsive to ongoing use of intravitreal Eylea. Today at 5-week interval.  OS, new onset serous retinal detachment in the subfoveal location. Curiously previous serous detachment OS was located temporal to the macula and outs in the subfoveal location.              Procedure: Injection intravitreal pharmacological agent right eye  Eylea (aflibercept) 08-18-20  Correct site identified with staff and MD.  Topical AKTEN applied OD  Betadine 10% and 5% applied topical OD followed by tobramycin topically OD.  0.05 mL of Eylea (aflibercept) was injected via 30-gauge needle inferotemporally without complication surface of  the eye was then irrigated  NDC is standard for prefilled syringe  Lot #[id number]  No complications, vision preserved   ASSESSMENT/PLAN:  Central serous retinopathy, left New worsening of subfoveal serous  fluid in the macula and no longer seen temporally OS  Follow-up in 1 to 2 weeks left eye for fluorescein angiography to look for treatable site of leakage  Exudative age-related macular degeneration of right eye with active choroidal neovascularization ([place]) Repeat injection intravitreal Eylea today to maintain.  Currently at 5-week interval.  Repeat examination right eye in 5 weeks  Cystoid macular edema of right eye Present as part of CNVM OD      ICD-10-CM   1. Exudative age-related macular degeneration of right eye with active choroidal neovascularization (HCC)  H35.3211 OCT, Retina - OU - Both Eyes  2. Central serous retinopathy, left  H35.712   3. Cystoid macular edema of right eye  H35.351     1.  OD overall stable at 5 weeks post intravitreal Eylea.  We will repeat today  2.  Really patient is not on systemic steroids yet central serous retinopathy has worsened and previous serous fluid temporal to the macula is now in a subfoveal location.  3.  Late OU in the near future for fluorescein angiography transit left eye looking for sites of leakage.    Ophthalmic Meds Ordered this visit:  No orders of the defined types were placed in this encounter.      Return in about 2 weeks (around 09/01/2020) for DILATE OU, COLOR FP, OPTOS FFA L/R.  There are no Patient Instructions on file for this visit.   Explained the diagnoses, plan, and follow up with the patient and they expressed understanding.  Patient expressed understanding of the importance of proper follow up care.   Clent Demark Aja Whitehair M.D. Diseases & Surgery of the Retina and Vitreous Retina & Diabetic [place] 08/18/20     Abbreviations: M myopia (nearsighted); A astigmatism; H hyperopia (farsighted); P  presbyopia; Mrx spectacle prescription;  CTL contact lenses; OD right eye; OS left eye; OU both eyes  XT exotropia; ET esotropia; PEK punctate epithelial keratitis; PEE punctate epithelial erosions; DES dry eye syndrome; MGD meibomian gland dysfunction; ATs artificial tears; PFAT's preservative free artificial tears; [place] nuclear sclerotic cataract; PSC posterior subcapsular cataract; ERM epi-retinal membrane; PVD posterior vitreous detachment; RD retinal detachment; DM diabetes mellitus; DR diabetic retinopathy; NPDR non-proliferative diabetic retinopathy; PDR proliferative diabetic retinopathy; CSME clinically significant macular edema; DME diabetic macular edema; dbh dot blot hemorrhages; CWS cotton wool spot; POAG primary open angle glaucoma; C/D cup-to-disc ratio; HVF humphrey visual field; GVF goldmann visual field; OCT optical coherence tomography; IOP intraocular pressure; BRVO Branch retinal vein occlusion; CRVO central retinal vein occlusion; CRAO central retinal artery occlusion; BRAO branch retinal artery occlusion; RT retinal tear; SB scleral buckle; PPV pars plana vitrectomy; VH Vitreous hemorrhage; PRP panretinal laser photocoagulation; IVK intravitreal kenalog; VMT vitreomacular traction; MH Macular hole;  NVD neovascularization of the disc; NVE neovascularization elsewhere; AREDS age related eye disease study; ARMD age related macular degeneration; POAG primary open angle glaucoma; EBMD epithelial/anterior basement membrane dystrophy; ACIOL anterior chamber intraocular lens; IOL intraocular lens; PCIOL posterior chamber intraocular lens; Phaco/IOL phacoemulsification with intraocular lens placement; [place] photorefractive keratectomy; LASIK laser assisted in situ keratomileusis; HTN hypertension; DM diabetes mellitus; COPD chronic obstructive pulmonary disease

## 2020-08-18 NOTE — Assessment & Plan Note (Signed)
Present as part of CNVM OD

## 2020-08-22 DIAGNOSIS — H353211 Exudative age-related macular degeneration, right eye, with active choroidal neovascularization: Secondary | ICD-10-CM | POA: Diagnosis not present

## 2020-08-22 MED ORDER — AFLIBERCEPT 2MG/0.05ML IZ SOLN FOR KALEIDOSCOPE
2.0000 mg | INTRAVITREAL | Status: AC | PRN
  Administered 2020-08-22: 2 mg via INTRAVITREAL

## 2020-09-01 ENCOUNTER — Encounter (HOSPITAL_COMMUNITY): Payer: Commercial Managed Care - PPO

## 2020-09-01 ENCOUNTER — Other Ambulatory Visit: Payer: Self-pay

## 2020-09-01 ENCOUNTER — Encounter (HOSPITAL_COMMUNITY)
Admission: RE | Admit: 2020-09-01 | Discharge: 2020-09-01 | Disposition: A | Discharge status: Home / Self Care | Payer: Commercial Managed Care - PPO | Source: Ambulatory Visit | Attending: Nephrology | Admitting: Nephrology

## 2020-09-01 VITALS — BP 143/102 | HR 91 | Temp 98.0°F | Resp 18

## 2020-09-01 DIAGNOSIS — D631 Anemia in chronic kidney disease: Secondary | ICD-10-CM

## 2020-09-01 DIAGNOSIS — N185 Chronic kidney disease, stage 5: Secondary | ICD-10-CM | POA: Insufficient documentation

## 2020-09-01 LAB — FERRITIN: Ferritin: 609 ng/mL — ABNORMAL HIGH (ref 24–336)

## 2020-09-01 LAB — IRON AND TIBC
Iron: 91 ug/dL (ref 45–182)
Saturation Ratios: 33 % (ref 17.9–39.5)
TIBC: 277 ug/dL (ref 250–450)
UIBC: 186 ug/dL

## 2020-09-01 LAB — POCT HEMOGLOBIN-HEMACUE: Hemoglobin: 10.2 g/dL — ABNORMAL LOW (ref 13.0–17.0)

## 2020-09-01 MED ORDER — EPOETIN ALFA-EPBX 10000 UNIT/ML IJ SOLN
INTRAMUSCULAR | Status: AC
  Administered 2020-09-01: 20000 [IU] via SUBCUTANEOUS
  Filled 2020-09-01: qty 2

## 2020-09-01 MED ORDER — EPOETIN ALFA-EPBX 10000 UNIT/ML IJ SOLN: 20000.0000 [IU] | INTRAMUSCULAR | Status: DC

## 2020-09-05 ENCOUNTER — Other Ambulatory Visit: Payer: Self-pay

## 2020-09-05 ENCOUNTER — Encounter (INDEPENDENT_AMBULATORY_CARE_PROVIDER_SITE_OTHER): Payer: Self-pay | Admitting: Ophthalmology

## 2020-09-05 ENCOUNTER — Ambulatory Visit (INDEPENDENT_AMBULATORY_CARE_PROVIDER_SITE_OTHER): Payer: Commercial Managed Care - PPO | Admitting: Ophthalmology

## 2020-09-05 DIAGNOSIS — H35712 Central serous chorioretinopathy, left eye: Secondary | ICD-10-CM | POA: Diagnosis not present

## 2020-09-05 DIAGNOSIS — H353211 Exudative age-related macular degeneration, right eye, with active choroidal neovascularization: Secondary | ICD-10-CM

## 2020-09-05 MED ORDER — FLUORESCEIN SODIUM 10 % IV SOLN
500.0000 mg | INTRAVENOUS | Status: AC | PRN
  Administered 2020-09-05: 500 mg via INTRAVENOUS

## 2020-09-05 NOTE — Progress Notes (Signed)
09/05/2020     CHIEF COMPLAINT Patient presents for Retina Follow Up (2 Week F/U, FP/FFA L/R//Pt denies noticeable changes to [place] OU since last visit. Pt denies ocular pain, flashes of light, or floaters OU. //)   HISTORY OF PRESENT ILLNESS: Jordan Hanna is a 60 y.o. male who presents to the clinic today for:   HPI    Retina Follow Up    Patient presents with  Wet AMD.  In left eye.  This started 2 weeks ago.  Severity is mild.  Duration of 2 weeks.  Since onset it is stable. Additional comments: 2 Week F/U, FP/FFA L/R  Pt denies noticeable changes to [place] OU since last visit. Pt denies ocular pain, flashes of light, or floaters OU.          Last edited by Rockie Neighbours, [place] on 09/05/2020  3:44 PM. (History)      Referring physician: Dorthula Perfect, MD No address on file  HISTORICAL INFORMATION:   Selected notes from the [facility]: No current outpatient medications on file. (Ophthalmic Drugs)   No current facility-administered medications for this visit. (Ophthalmic Drugs)   Current Outpatient Medications (Other)  Medication Sig  . amLODipine (NORVASC) 5 MG tablet Take by mouth.  Marland Kitchen eplerenone (INSPRA) 25 MG tablet Take 25 mg by mouth daily.  Marland Kitchen epoetin alfa (EPOGEN) 10000 UNIT/ML injection 10,000 Units every 14 (fourteen) days.  . hydroxychloroquine (PLAQUENIL) 200 MG tablet Take by mouth.  . losartan (COZAAR) 50 MG tablet Take by mouth.  . mirtazapine (REMERON) 30 MG tablet Take 30 mg by mouth at bedtime.  . mycophenolate (CELLCEPT) 500 MG tablet   . riTUXimab-abbs ([place]) 500 MG/50ML injection Inject into the vein.  Marland Kitchen zolpidem (AMBIEN) 5 MG tablet Take 5 mg by mouth at bedtime as needed.   No current facility-administered medications for this visit. (Other)      REVIEW OF SYSTEMS:    ALLERGIES Allergies  Allergen Reactions  . Prednisone Swelling    Swelling behind the eye that causes not being able to  see    PAST MEDICAL HISTORY Past Medical History:  Diagnosis Date  . Anemia in chronic kidney disease (CKD)   . Chronic kidney disease   . Hypertension    Past Surgical History:  Procedure Laterality Date  . LASIK Bilateral 1999    FAMILY HISTORY Family History  Problem Relation Age of Onset  . Diabetes Mother   . Hypertension Father   . Diabetes Father     SOCIAL HISTORY Social History   Tobacco Use  . Smoking status: Never Smoker  . Smokeless tobacco: Never Used         OPHTHALMIC EXAM:  Base Eye Exam    Visual Acuity (ETDRS)      Right Left   Dist [place] 20/50 +2 20/30   Dist ph [place] NI        Tonometry (Tonopen, 3:45 PM)      Right Left   Pressure 10 10       Pupils      Pupils Dark Light Shape React APD   Right PERRL 4 3 Round Brisk None   Left PERRL 4 3 Round Brisk None       Visual Fields (Counting fingers)      Left Right    Full Full       Extraocular Movement      Right Left  Full Full       Neuro/Psych    Oriented x3: Yes   Mood/Affect: Normal       Dilation    Both eyes: 1.0% Mydriacyl, 2.5% Phenylephrine @ 3:48 PM        Slit Lamp and Fundus Exam    External Exam      Right Left   External Normal Normal       Slit Lamp Exam      Right Left   Lids/Lashes 1+ Dermatochalasis - upper lid 1+ Dermatochalasis - upper lid   Conjunctiva/Sclera White and quiet White and quiet   Cornea Clear Clear   Anterior Chamber Deep and quiet Deep and quiet   Iris Round and reactive Round and reactive   Lens 1+ Nuclear sclerosis 1+ Nuclear sclerosis   Anterior Vitreous Normal Normal       Fundus Exam      Right Left   Posterior Vitreous Posterior vitreous detachment Normal   Disc Normal Normal   C/D Ratio 0.5 0.5   Macula Disciform scar, Retinal pigment epithelial mottling, Retinal pigment epithelial atrophy, no hemorrhage, no exudates, Subretinal fibrosis Nonturbid subretinal fluid   Vessels Normal Normal   Periphery Normal Normal           IMAGING AND PROCEDURES  Imaging and Procedures for 09/05/20  Color Fundus Photography Optos - OU - Both Eyes       Right Eye Progression has been stable. Disc findings include normal observations. Macula : retinal pigment epithelium abnormalities, drusen. Vessels : normal observations.   Left Eye Progression has been stable. Disc findings include normal observations. Macula : retinal pigment epithelium abnormalities, drusen.   Notes Retinal periphery with areas of RPE atrophy in a guttering fashion inferior to the macula a sign of previous central serous resolution fluid OD.         Fluorescein Angiography Optos (Transit OS)       Injection:  500 mg Fluorescein Sodium 10 % injection   NDC: [phone]   Route: Intravenous, Site: Left ArmRight Eye   Progression has no prior data.   Left Eye   Progression has no prior data. Early phase findings include leakage, window defect. Mid/Late phase findings include leakage, window defect.   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  Leakages are noted in a diffuse pattern.  Prominent  temporal to the macula and what could be MAC-TEL or focal CS CR OS.       OCT, Retina - OU - Both Eyes       Right Eye Quality was good. Scan locations included subfoveal. Central Foveal Thickness: 345. Progression has improved. Findings include abnormal foveal contour, subretinal hyper-reflective material, cystoid macular edema, subretinal scarring, disciform scar.   Left Eye Quality was good. Scan locations included subfoveal. Central Foveal Thickness: 489. Progression has worsened. Findings include subretinal fluid.   Notes OD with much less juxta foveal and foveal intraretinal fluid and CME overlying disciform scar.  Almost 3 weeks post injection Eylea.  Will need repeat injection and evaluation OD next as scheduled  Left eye                 ASSESSMENT/PLAN:  Central serous retinopathy, left Patient has been on eplerenone 25 mg daily for combination of blood pressures but also to prevent and treat central serous retinopathy which has been recurrent in the past.  Patient still has borderline elevated pressure by  his verbal report and we could in fact use eplerenone 50 mg daily if his nephrologist will permit this and if it is tolerated by his blood pressure management, eplerenone is a known treatment for central serous retinopathy as is present in the left eye.  In a curious fashion the patient reports that as of October the patient has been on EPO injections to raise his anemia to a more normal blood count.  Coincident with this has been this development of the central serous retinopathy perhaps due to enhance blood flow oxygen carrying capacity      ICD-10-CM   1. Central serous retinopathy, left  H35.712 Color Fundus Photography Optos - OU - Both Eyes    Fluorescein Angiography Optos (Transit OS)    Fluorescein Sodium 10 % injection 500 mg    OCT, Retina - OU - Both Eyes    CANCELED: OCT, Retina - OS - Left Eye  2. Exudative age-related macular degeneration of right eye with active choroidal neovascularization (HCC)  H35.3211 Color Fundus Photography Optos - OU - Both Eyes    Fluorescein Angiography Optos (Transit OS)    Fluorescein Sodium 10 % injection 500 mg    OCT, Retina - OU - Both Eyes    1.  Central serous retinopathy left eye, increasing and worse on OCT, yet stable on vision today  2.  Patient is on eplerenone 25 mg, and a cursory evaluation discloses that 50 mg once daily can be used as a dose to control blood pressure.  As a side effect this medication can sometimes decrease serous retinopathy centrally and in the eye such as worsened in his left.  3.  Patient will discuss and confirm with his nephrologist whether an increase in dose from 25 to 50 mg of Inspra, eplerenone, can be undertaken which would  have the benefit of trying to diminish the serous detachment of the left eye. 4.  There are regions temporal to the fovea which appear to be potentially causative but also superotemporal macula that might be amenable to micro pulse subthermal laser therapy to try to induce a healthier RPE response and reduce the size of central serous retinopathy if increase in eplerenone is not possible or does not improve his blood pressure and/or central serous retinopathy with good acuity we have some time to consider these issues  Ophthalmic Meds Ordered this visit:  Meds ordered this encounter  Medications  . Fluorescein Sodium 10 % injection 500 mg       No follow-ups on file.  There are no Patient Instructions on file for this visit.   Explained the diagnoses, plan, and follow up with the patient and they expressed understanding.  Patient expressed understanding of the importance of proper follow up care.   Clent Demark Tell Rozelle M.D. Diseases & Surgery of the Retina and Vitreous Retina & Diabetic [place] 09/05/20     Abbreviations: M myopia (nearsighted); A astigmatism; H hyperopia (farsighted); P presbyopia; Mrx spectacle prescription;  CTL contact lenses; OD right eye; OS left eye; OU both eyes  XT exotropia; ET esotropia; PEK punctate epithelial keratitis; PEE punctate epithelial erosions; DES dry eye syndrome; MGD meibomian gland dysfunction; ATs artificial tears; PFAT's preservative free artificial tears; [place] nuclear sclerotic cataract; PSC posterior subcapsular cataract; ERM epi-retinal membrane; PVD posterior vitreous detachment; RD retinal detachment; DM diabetes mellitus; DR diabetic retinopathy; NPDR non-proliferative diabetic retinopathy; PDR proliferative diabetic retinopathy; CSME clinically significant macular edema; DME diabetic macular edema; dbh dot blot hemorrhages; CWS cotton  wool spot; POAG primary open angle glaucoma; C/D cup-to-disc ratio; HVF humphrey visual field; GVF goldmann  visual field; OCT optical coherence tomography; IOP intraocular pressure; BRVO Branch retinal vein occlusion; CRVO central retinal vein occlusion; CRAO central retinal artery occlusion; BRAO branch retinal artery occlusion; RT retinal tear; SB scleral buckle; PPV pars plana vitrectomy; VH Vitreous hemorrhage; PRP panretinal laser photocoagulation; IVK intravitreal kenalog; VMT vitreomacular traction; MH Macular hole;  NVD neovascularization of the disc; NVE neovascularization elsewhere; AREDS age related eye disease study; ARMD age related macular degeneration; POAG primary open angle glaucoma; EBMD epithelial/anterior basement membrane dystrophy; ACIOL anterior chamber intraocular lens; IOL intraocular lens; PCIOL posterior chamber intraocular lens; Phaco/IOL phacoemulsification with intraocular lens placement; [place] photorefractive keratectomy; LASIK laser assisted in situ keratomileusis; HTN hypertension; DM diabetes mellitus; COPD chronic obstructive pulmonary disease

## 2020-09-05 NOTE — Assessment & Plan Note (Signed)
Patient has been on eplerenone 25 mg daily for combination of blood pressures but also to prevent and treat central serous retinopathy which has been recurrent in the past.  Patient still has borderline elevated pressure by his verbal report and we could in fact use eplerenone 50 mg daily if his nephrologist will permit this and if it is tolerated by his blood pressure management, eplerenone is a known treatment for central serous retinopathy as is present in the left eye.  In a curious fashion the patient reports that as of October the patient has been on EPO injections to raise his anemia to a more normal blood count.  Coincident with this has been this development of the central serous retinopathy perhaps due to enhance blood flow oxygen carrying capacity

## 2020-09-07 ENCOUNTER — Encounter (INDEPENDENT_AMBULATORY_CARE_PROVIDER_SITE_OTHER): Payer: Self-pay | Admitting: Ophthalmology

## 2020-09-15 ENCOUNTER — Encounter (HOSPITAL_COMMUNITY)
Admission: RE | Admit: 2020-09-15 | Discharge: 2020-09-15 | Disposition: A | Discharge status: Home / Self Care | Payer: Commercial Managed Care - PPO | Source: Ambulatory Visit | Attending: Nephrology | Admitting: Nephrology

## 2020-09-15 ENCOUNTER — Encounter (HOSPITAL_COMMUNITY): Payer: Commercial Managed Care - PPO

## 2020-09-15 ENCOUNTER — Other Ambulatory Visit: Payer: Self-pay

## 2020-09-15 VITALS — BP 149/103 | HR 86 | Temp 97.6°F | Resp 18

## 2020-09-15 DIAGNOSIS — N185 Chronic kidney disease, stage 5: Secondary | ICD-10-CM | POA: Diagnosis not present

## 2020-09-15 DIAGNOSIS — D631 Anemia in chronic kidney disease: Secondary | ICD-10-CM

## 2020-09-15 LAB — POCT HEMOGLOBIN-HEMACUE: Hemoglobin: 10.4 g/dL — ABNORMAL LOW (ref 13.0–17.0)

## 2020-09-15 MED ORDER — EPOETIN ALFA-EPBX 10000 UNIT/ML IJ SOLN: 20000.0000 [IU] | INTRAMUSCULAR | Status: DC

## 2020-09-15 MED ORDER — EPOETIN ALFA-EPBX 10000 UNIT/ML IJ SOLN
INTRAMUSCULAR | Status: AC
  Administered 2020-09-15: 20000 [IU] via SUBCUTANEOUS
  Filled 2020-09-15: qty 2

## 2020-09-19 ENCOUNTER — Other Ambulatory Visit: Payer: Self-pay

## 2020-09-19 ENCOUNTER — Encounter (INDEPENDENT_AMBULATORY_CARE_PROVIDER_SITE_OTHER): Payer: Self-pay | Admitting: Ophthalmology

## 2020-09-19 ENCOUNTER — Ambulatory Visit (INDEPENDENT_AMBULATORY_CARE_PROVIDER_SITE_OTHER): Payer: Commercial Managed Care - PPO | Admitting: Ophthalmology

## 2020-09-19 DIAGNOSIS — H353211 Exudative age-related macular degeneration, right eye, with active choroidal neovascularization: Secondary | ICD-10-CM | POA: Diagnosis not present

## 2020-09-19 DIAGNOSIS — H35712 Central serous chorioretinopathy, left eye: Secondary | ICD-10-CM | POA: Diagnosis not present

## 2020-09-19 MED ORDER — AFLIBERCEPT 2MG/0.05ML IZ SOLN FOR KALEIDOSCOPE
2.0000 mg | INTRAVITREAL | Status: AC | PRN
  Administered 2020-09-19: 2 mg via INTRAVITREAL

## 2020-09-19 NOTE — Assessment & Plan Note (Signed)
Smaller today left eye coincident with increase in eplerenone to 50 mg daily

## 2020-09-19 NOTE — Assessment & Plan Note (Signed)
Improved macular findings OD at 4 weeks and 4 days post last injection Eylea, repeat injection today to control intraretinal fluid which has been increasing recently

## 2020-09-19 NOTE — Progress Notes (Signed)
09/19/2020     CHIEF COMPLAINT Patient presents for Retina Follow Up (2 Week F/U OU, poss 4 week Eylea OD//Pt is having kidney transplant 10/17/20. Pt denies [facility] changes OU since last visit.)   HISTORY OF PRESENT ILLNESS: Jordan Hanna is a 60 y.o. male who presents to the clinic today for:   HPI    Retina Follow Up    Patient presents with  Wet AMD.  In right eye.  This started 4 weeks ago.  Severity is mild.  Duration of 4 weeks.  Since onset it is stable. Additional comments: 2 Week F/U OU, poss 4 week Eylea OD  Pt is having kidney transplant 10/17/20. Pt denies [facility] changes OU since last visit.       Last edited by Rockie Neighbours, [place] on 09/19/2020  3:02 PM. (History)      Referring physician: Dorthula Perfect, MD No address on file  HISTORICAL INFORMATION:   Selected notes from the [facility]: No current outpatient medications on file. (Ophthalmic Drugs)   No current facility-administered medications for this visit. (Ophthalmic Drugs)   Current Outpatient Medications (Other)  Medication Sig   amLODipine (NORVASC) 5 MG tablet Take by mouth.   eplerenone (INSPRA) 25 MG tablet Take 25 mg by mouth daily.   eplerenone (INSPRA) 50 MG tablet Take 50 mg by mouth daily.   epoetin alfa (EPOGEN) 10000 UNIT/ML injection 10,000 Units every 14 (fourteen) days.   hydroxychloroquine (PLAQUENIL) 200 MG tablet Take by mouth.   losartan (COZAAR) 50 MG tablet Take by mouth.   mirtazapine (REMERON) 30 MG tablet Take 30 mg by mouth at bedtime.   mycophenolate (CELLCEPT) 500 MG tablet    riTUXimab-abbs (TRUXIMA) 500 MG/50ML injection Inject into the vein.   zolpidem (AMBIEN) 5 MG tablet Take 5 mg by mouth at bedtime as needed.   No current facility-administered medications for this visit. (Other)      REVIEW OF SYSTEMS:    ALLERGIES Allergies  Allergen Reactions   Prednisone Swelling    Swelling behind the eye that  causes not being able to see    PAST MEDICAL HISTORY Past Medical History:  Diagnosis Date   Anemia in chronic kidney disease (CKD)    Chronic kidney disease    Hypertension    Past Surgical History:  Procedure Laterality Date   LASIK Bilateral 1999    FAMILY HISTORY Family History  Problem Relation Age of Onset   Diabetes Mother    Hypertension Father    Diabetes Father     SOCIAL HISTORY Social History   Tobacco Use   Smoking status: Never Smoker   Smokeless tobacco: Never Used         OPHTHALMIC EXAM:  Base Eye Exam    Visual Acuity (ETDRS)      Right Left   Dist [place] 20/50 -2 20/50 +2   Dist ph [place] 20/40 -1 NI       Tonometry (Tonopen, 3:04 PM)      Right Left   Pressure 12 10       Pupils      Pupils Dark Light Shape React APD   Right PERRL 4 3 Round Brisk None   Left PERRL 4 3 Round Brisk None       Visual Fields (Counting fingers)      Left Right    Full Full       Extraocular Movement  Right Left    Full Full       Neuro/Psych    Oriented x3: Yes   Mood/Affect: Normal       Dilation    Both eyes: 1.0% Mydriacyl, 2.5% Phenylephrine @ 3:08 PM        Slit Lamp and Fundus Exam    External Exam      Right Left   External Normal Normal       Slit Lamp Exam      Right Left   Lids/Lashes 1+ Dermatochalasis - upper lid 1+ Dermatochalasis - upper lid   Conjunctiva/Sclera White and quiet White and quiet   Cornea Clear Clear   Anterior Chamber Deep and quiet Deep and quiet   Iris Round and reactive Round and reactive   Lens 1+ Nuclear sclerosis 1+ Nuclear sclerosis   Anterior Vitreous Normal Normal       Fundus Exam      Right Left   Posterior Vitreous Posterior vitreous detachment Normal   Disc Normal Normal   C/D Ratio 0.5 0.4   Macula Disciform scar, Retinal pigment epithelial mottling, Retinal pigment epithelial atrophy, no hemorrhage, no exudates, Subretinal fibrosis Nonturbid subretinal fluid, no hemorrhage    Vessels Normal Normal   Periphery Normal Normal          IMAGING AND PROCEDURES  Imaging and Procedures for 09/19/20  OCT, Retina - OU - Both Eyes       Right Eye Quality was good. Scan locations included subfoveal. Central Foveal Thickness: 336. Progression has improved. Findings include subretinal scarring, disciform scar, cystoid macular edema.   Left Eye Quality was good. Scan locations included subfoveal. Central Foveal Thickness: 399. Progression has improved. Findings include abnormal foveal contour, subretinal fluid.   Notes Serous subretinal fluid left eye has slightly improved over the last 2 weeks coincident with an increase in his antihypertensive and therapy for CSCR, eplerenone  OD,  Subfoveal scarring, disciform scar each well controlled and stable on intravitreal Eylea       Intravitreal Injection, Pharmacologic Agent - OD - Right Eye       Time Out 09/19/2020. 4:36 PM. Confirmed correct patient, procedure, site, and patient consented.   Anesthesia Topical anesthesia was used. Anesthetic medications included [place] 3.5%.   Procedure Preparation included Tobramycin 0.3%, Ofloxacin , 5% betadine to ocular surface, 10% betadine to eyelids. A 30 gauge needle was used.   Injection:  2 mg aflibercept [name]   NDC: 79390-300-92   Route: Intravitreal, Site: Right Eye, Waste: 0 mg  Post-op Post injection exam found visual acuity of at least counting fingers. The patient tolerated the procedure well. There were no complications. The patient received written and verbal post procedure care education. Post injection medications were not given.                 ASSESSMENT/PLAN:  Exudative age-related macular degeneration of right eye with active choroidal neovascularization (HCC) Improved macular findings OD at 4 weeks and 4 days post last injection Eylea, repeat injection today to control intraretinal fluid which has been increasing recently  Central  serous retinopathy, left Smaller today left eye coincident with increase in eplerenone to 50 mg daily      ICD-10-CM   1. Exudative age-related macular degeneration of right eye with active choroidal neovascularization (HCC)  H35.3211 OCT, Retina - OU - Both Eyes    Intravitreal Injection, Pharmacologic Agent - OD - Right Eye    aflibercept (EYLEA) SOLN 2 mg  2. Central serous retinopathy, left  H35.712     1.  We will repeat injection intravitreal Eylea today to maintain visual functioning in this lesion that has sensitivity and lessening of intraretinal fluid postinjection.  Preservation of vision maintained OD.  2.  Serous retinopathy has improved left eye with increased dose of eplerenone.  We will continue to observe this left eye  3.  Follow-up again next in 6 weeks as this will be roughly 2 weeks post renal transplant  Ophthalmic Meds Ordered this visit:  Meds ordered this encounter  Medications   aflibercept (EYLEA) SOLN 2 mg       Return in about 6 weeks (around 10/31/2020) for dilate, EYLEA OCT, OD.  There are no Patient Instructions on file for this visit.   Explained the diagnoses, plan, and follow up with the patient and they expressed understanding.  Patient expressed understanding of the importance of proper follow up care.   Clent Demark Jolean Madariaga M.D. Diseases & Surgery of the Retina and Vitreous Retina & Diabetic [place] 09/19/20     Abbreviations: M myopia (nearsighted); A astigmatism; H hyperopia (farsighted); P presbyopia; Mrx spectacle prescription;  CTL contact lenses; OD right eye; OS left eye; OU both eyes  XT exotropia; ET esotropia; PEK punctate epithelial keratitis; PEE punctate epithelial erosions; DES dry eye syndrome; MGD meibomian gland dysfunction; ATs artificial tears; PFAT's preservative free artificial tears; [place] nuclear sclerotic cataract; PSC posterior subcapsular cataract; ERM epi-retinal membrane; PVD posterior vitreous detachment; RD  retinal detachment; DM diabetes mellitus; DR diabetic retinopathy; NPDR non-proliferative diabetic retinopathy; PDR proliferative diabetic retinopathy; CSME clinically significant macular edema; DME diabetic macular edema; dbh dot blot hemorrhages; CWS cotton wool spot; POAG primary open angle glaucoma; C/D cup-to-disc ratio; HVF humphrey visual field; GVF goldmann visual field; OCT optical coherence tomography; IOP intraocular pressure; BRVO Branch retinal vein occlusion; CRVO central retinal vein occlusion; CRAO central retinal artery occlusion; BRAO branch retinal artery occlusion; RT retinal tear; SB scleral buckle; PPV pars plana vitrectomy; VH Vitreous hemorrhage; PRP panretinal laser photocoagulation; IVK intravitreal kenalog; VMT vitreomacular traction; MH Macular hole;  NVD neovascularization of the disc; NVE neovascularization elsewhere; AREDS age related eye disease study; ARMD age related macular degeneration; POAG primary open angle glaucoma; EBMD epithelial/anterior basement membrane dystrophy; ACIOL anterior chamber intraocular lens; IOL intraocular lens; PCIOL posterior chamber intraocular lens; Phaco/IOL phacoemulsification with intraocular lens placement; [place] photorefractive keratectomy; LASIK laser assisted in situ keratomileusis; HTN hypertension; DM diabetes mellitus; COPD chronic obstructive pulmonary disease

## 2020-09-29 ENCOUNTER — Encounter (HOSPITAL_COMMUNITY): Payer: Commercial Managed Care - PPO

## 2020-09-30 ENCOUNTER — Other Ambulatory Visit: Payer: Self-pay

## 2020-09-30 ENCOUNTER — Ambulatory Visit (HOSPITAL_COMMUNITY)
Admission: RE | Admit: 2020-09-30 | Discharge: 2020-09-30 | Disposition: A | Discharge status: Home / Self Care | Payer: Commercial Managed Care - PPO | Source: Ambulatory Visit | Attending: Nephrology | Admitting: Nephrology

## 2020-09-30 VITALS — BP 129/93 | HR 79 | Resp 16

## 2020-09-30 DIAGNOSIS — N185 Chronic kidney disease, stage 5: Secondary | ICD-10-CM | POA: Insufficient documentation

## 2020-09-30 DIAGNOSIS — D631 Anemia in chronic kidney disease: Secondary | ICD-10-CM | POA: Insufficient documentation

## 2020-09-30 LAB — FERRITIN: Ferritin: 701 ng/mL — ABNORMAL HIGH (ref 24–336)

## 2020-09-30 LAB — IRON AND TIBC
Iron: 98 ug/dL (ref 45–182)
Saturation Ratios: 37 % (ref 17.9–39.5)
TIBC: 265 ug/dL (ref 250–450)
UIBC: 167 ug/dL

## 2020-09-30 LAB — POCT HEMOGLOBIN-HEMACUE: Hemoglobin: 10 g/dL — ABNORMAL LOW (ref 13.0–17.0)

## 2020-09-30 MED ORDER — EPOETIN ALFA-EPBX 10000 UNIT/ML IJ SOLN
INTRAMUSCULAR | Status: AC
  Administered 2020-09-30: 20000 [IU] via SUBCUTANEOUS
  Filled 2020-09-30: qty 2

## 2020-09-30 MED ORDER — EPOETIN ALFA-EPBX 10000 UNIT/ML IJ SOLN: 20000.0000 [IU] | INTRAMUSCULAR | Status: DC

## 2020-10-14 ENCOUNTER — Encounter (HOSPITAL_COMMUNITY): Payer: Commercial Managed Care - PPO

## 2020-10-17 HISTORY — PX: OTHER SURGICAL HISTORY: SHX169

## 2020-10-18 DIAGNOSIS — Z940 Kidney transplant status: Secondary | ICD-10-CM | POA: Insufficient documentation

## 2020-10-19 DIAGNOSIS — Z792 Long term (current) use of antibiotics: Secondary | ICD-10-CM | POA: Insufficient documentation

## 2020-10-19 DIAGNOSIS — D849 Immunodeficiency, unspecified: Secondary | ICD-10-CM | POA: Insufficient documentation

## 2020-10-21 HISTORY — PX: RENAL BIOPSY: SHX156

## 2020-10-22 DIAGNOSIS — Z960 Presence of urogenital implants: Secondary | ICD-10-CM | POA: Insufficient documentation

## 2020-10-31 ENCOUNTER — Encounter (INDEPENDENT_AMBULATORY_CARE_PROVIDER_SITE_OTHER): Payer: Self-pay | Admitting: Ophthalmology

## 2020-10-31 ENCOUNTER — Ambulatory Visit (INDEPENDENT_AMBULATORY_CARE_PROVIDER_SITE_OTHER): Payer: Commercial Managed Care - PPO | Admitting: Ophthalmology

## 2020-10-31 ENCOUNTER — Other Ambulatory Visit: Payer: Self-pay

## 2020-10-31 DIAGNOSIS — H35712 Central serous chorioretinopathy, left eye: Secondary | ICD-10-CM | POA: Diagnosis not present

## 2020-10-31 DIAGNOSIS — H353211 Exudative age-related macular degeneration, right eye, with active choroidal neovascularization: Secondary | ICD-10-CM

## 2020-10-31 NOTE — Assessment & Plan Note (Signed)
OD, with less active CNVM stable and in fact improved at 6 weeks post injections (also 2 weeks post renal transplant)  We will continue to observe so that theoretical systemic effects distantly of intravitreal Eylea, will have no impact on his recent surgical implantation of renal transplant  We will delay therapy today and repeat visit  3 to 4 weeks and reevaluate right eye at that time

## 2020-10-31 NOTE — Progress Notes (Signed)
10/31/2020     CHIEF COMPLAINT Patient presents for Retina Follow Up (6 wk f/u OCT Eylea OD/Pt just had Renal Transplant on 10/17/2020//Pt states [facility] OU stable since last visit. Pt denies FOL, floaters, or ocular pain OU.  )   HISTORY OF PRESENT ILLNESS: Jordan Hanna is a 60 y.o. male who presents to the clinic today for:   HPI    Retina Follow Up    Patient presents with  Wet AMD.  In right eye.  This started 6 weeks ago.  Severity is mild.  Duration of 6 weeks.  Since onset it is stable. Additional comments: 6 wk f/u OCT Eylea OD Pt just had Renal Transplant on 10/17/2020  Pt states [facility] OU stable since last visit. Pt denies FOL, floaters, or ocular pain OU.         Last edited by Kendra Opitz, COA on 10/31/2020  3:56 PM. (History)      Referring physician: Dorthula Perfect, MD No address on file  HISTORICAL INFORMATION:   Selected notes from the [place]: No current outpatient medications on file. (Ophthalmic Drugs)   No current facility-administered medications for this visit. (Ophthalmic Drugs)   Current Outpatient Medications (Other)  Medication Sig  . amLODipine (NORVASC) 5 MG tablet Take by mouth.  Marland Kitchen eplerenone (INSPRA) 25 MG tablet Take 25 mg by mouth daily.  Marland Kitchen eplerenone (INSPRA) 50 MG tablet Take 50 mg by mouth daily.  Marland Kitchen epoetin alfa (EPOGEN) 10000 UNIT/ML injection 10,000 Units every 14 (fourteen) days.  . hydroxychloroquine (PLAQUENIL) 200 MG tablet Take by mouth.  . losartan (COZAAR) 50 MG tablet Take by mouth.  . mirtazapine (REMERON) 30 MG tablet Take 30 mg by mouth at bedtime.  . mycophenolate (CELLCEPT) 500 MG tablet   . riTUXimab-abbs ([place]) 500 MG/50ML injection Inject into the vein.  Marland Kitchen zolpidem (AMBIEN) 5 MG tablet Take 5 mg by mouth at bedtime as needed.   No current facility-administered medications for this visit. (Other)      REVIEW OF SYSTEMS:    ALLERGIES Allergies  Allergen  Reactions  . Prednisone Swelling    Swelling behind the eye that causes not being able to see    PAST MEDICAL HISTORY Past Medical History:  Diagnosis Date  . Anemia in chronic kidney disease (CKD)   . Chronic kidney disease   . Hypertension    Past Surgical History:  Procedure Laterality Date  . LASIK Bilateral 1999    FAMILY HISTORY Family History  Problem Relation Age of Onset  . Diabetes Mother   . Hypertension Father   . Diabetes Father     SOCIAL HISTORY Social History   Tobacco Use  . Smoking status: Never Smoker  . Smokeless tobacco: Never Used         OPHTHALMIC EXAM: Base Eye Exam    Visual Acuity (ETDRS)      Right Left   Dist [place] 20/40 -2 20/30   Dist ph [place] NI 20/20 -1       Tonometry (Tonopen, 4:00 PM)      Right Left   Pressure 12 15       Pupils      Pupils Dark Light Shape React APD   Right PERRL 4 3 Round Brisk None   Left PERRL 4 3 Round Brisk None       Visual Fields (Counting fingers)      Left Right  Full Full       Extraocular Movement      Right Left    Full Full       Neuro/Psych    Oriented x3: Yes   Mood/Affect: Normal       Dilation    Both eyes: 1.0% Mydriacyl, 2.5% Phenylephrine @ 4:01 PM        Slit Lamp and Fundus Exam    External Exam      Right Left   External Normal Normal       Slit Lamp Exam      Right Left   Lids/Lashes 1+ Dermatochalasis - upper lid 1+ Dermatochalasis - upper lid   Conjunctiva/Sclera White and quiet White and quiet   Cornea Clear Clear   Anterior Chamber Deep and quiet Deep and quiet   Iris Round and reactive Round and reactive   Lens 1+ Nuclear sclerosis Clear   Anterior Vitreous Normal        Fundus Exam      Right Left   Posterior Vitreous Posterior vitreous detachment, Central vitreous floaters Normal   Disc Normal Normal   C/D Ratio 0.5 0.4   Macula Disciform scar, Retinal pigment epithelial mottling, Retinal pigment epithelial atrophy, no hemorrhage, no  exudates, Subretinal fibrosis No subretinal fluid, no hemorrhage   Vessels Normal Normal   Periphery Normal Normal          IMAGING AND PROCEDURES  Imaging and Procedures for 10/31/20  OCT, Retina - OU - Both Eyes       Right Eye Quality was good. Scan locations included subfoveal. Central Foveal Thickness: 320. Progression has improved. Findings include disciform scar.   Left Eye Quality was good. Scan locations included subfoveal. Central Foveal Thickness: 248. Progression has improved.   Notes OD, with subfoveal fibrosis remaining, as disciform scar and overall less intraretinal fluid  At 6-week follow-up post Eylea injection,, but now with less uremia as patient is concomitantly 2 weeks post renal transplant  OS with much less subretinal fluid much less active central serous retinopathy near baseline as compared to January and February 2022.  No specific ophthalmic interventional therapy undertaken for serous retinopathy OS and improvement has occurred concomitant with improvement in uremic state                ASSESSMENT/PLAN:  Central serous retinopathy, left Much improved 2 weeks status post recent renal transplant, as is possible role of less uremic stress upon retinal RPE and thus abatement of central serous retinopathy?  Exudative age-related macular degeneration of right eye with active choroidal neovascularization (HCC) OD, with less active CNVM stable and in fact improved at 6 weeks post injections (also 2 weeks post renal transplant)  We will continue to observe so that theoretical systemic effects distantly of intravitreal Eylea, will have no impact on his recent surgical implantation of renal transplant  We will delay therapy today and repeat visit  3 to 4 weeks and reevaluate right eye at that time      ICD-10-CM   1. Exudative age-related macular degeneration of right eye with active choroidal neovascularization (HCC)  H35.3211 OCT, Retina - OU - Both  Eyes  2. Central serous retinopathy, left  H35.712     1.  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  Now that uremic status has improved 2 weeks post renal transplant, we will attempt to go longer duration before reinstitution of therapy for CNVM  OD with Eylea.  This should avoid the the potential theoretic side effect of 2 to 3 days of delayed healing from recent surgical renal transplantation  2.  Dilate 3 to 4 weeks consider intravitreal Eylea OD at that time  3.  OS of important note visual acuity is improved overall as the serous component of central serous retinopathy seen as a patient was in significant uremia in early 2022 prior to renal transplant, has now spontaneously resolved with no specific ophthalmic intervention  Ophthalmic Meds Ordered this visit:  No orders of the defined types were placed in this encounter.      Return in about 4 weeks (around 11/28/2020) for dilate, OD, EYLEA OCT.  There are no Patient Instructions on file for this visit.   Explained the diagnoses, plan, and follow up with the patient and they expressed understanding.  Patient expressed understanding of the importance of proper follow up care.   Clent Demark Prayan Ulin M.D. Diseases & Surgery of the Retina and Vitreous Retina & Diabetic [place] 10/31/20     Abbreviations: M myopia (nearsighted); A astigmatism; H hyperopia (farsighted); P presbyopia; Mrx spectacle prescription;  CTL contact lenses; OD right eye; OS left eye; OU both eyes  XT exotropia; ET esotropia; PEK punctate epithelial keratitis; PEE punctate epithelial erosions; DES dry eye syndrome; MGD meibomian gland dysfunction; ATs artificial tears; PFAT's preservative free artificial tears; [place] nuclear sclerotic cataract; PSC posterior subcapsular cataract; ERM epi-retinal membrane; PVD posterior vitreous detachment; RD retinal detachment; DM diabetes mellitus; DR  diabetic retinopathy; NPDR non-proliferative diabetic retinopathy; PDR proliferative diabetic retinopathy; CSME clinically significant macular edema; DME diabetic macular edema; dbh dot blot hemorrhages; CWS cotton wool spot; POAG primary open angle glaucoma; C/D cup-to-disc ratio; HVF humphrey visual field; GVF goldmann visual field; OCT optical coherence tomography; IOP intraocular pressure; BRVO Branch retinal vein occlusion; CRVO central retinal vein occlusion; CRAO central retinal artery occlusion; BRAO branch retinal artery occlusion; RT retinal tear; SB scleral buckle; PPV pars plana vitrectomy; VH Vitreous hemorrhage; PRP panretinal laser photocoagulation; IVK intravitreal kenalog; VMT vitreomacular traction; MH Macular hole;  NVD neovascularization of the disc; NVE neovascularization elsewhere; AREDS age related eye disease study; ARMD age related macular degeneration; POAG primary open angle glaucoma; EBMD epithelial/anterior basement membrane dystrophy; ACIOL anterior chamber intraocular lens; IOL intraocular lens; PCIOL posterior chamber intraocular lens; Phaco/IOL phacoemulsification with intraocular lens placement; [place] photorefractive keratectomy; LASIK laser assisted in situ keratomileusis; HTN hypertension; DM diabetes mellitus; COPD chronic obstructive pulmonary disease

## 2020-10-31 NOTE — Assessment & Plan Note (Signed)
Much improved 2 weeks status post recent renal transplant, as is possible role of less uremic stress upon retinal RPE and thus abatement of central serous retinopathy?

## 2020-11-24 ENCOUNTER — Other Ambulatory Visit: Payer: Self-pay

## 2020-11-24 ENCOUNTER — Encounter (INDEPENDENT_AMBULATORY_CARE_PROVIDER_SITE_OTHER): Payer: Self-pay | Admitting: Ophthalmology

## 2020-11-24 ENCOUNTER — Ambulatory Visit (INDEPENDENT_AMBULATORY_CARE_PROVIDER_SITE_OTHER): Payer: Commercial Managed Care - PPO | Admitting: Ophthalmology

## 2020-11-24 DIAGNOSIS — H2511 Age-related nuclear cataract, right eye: Secondary | ICD-10-CM

## 2020-11-24 DIAGNOSIS — H353211 Exudative age-related macular degeneration, right eye, with active choroidal neovascularization: Secondary | ICD-10-CM

## 2020-11-24 DIAGNOSIS — H35351 Cystoid macular degeneration, right eye: Secondary | ICD-10-CM | POA: Diagnosis not present

## 2020-11-24 MED ORDER — AFLIBERCEPT 2MG/0.05ML IZ SOLN FOR KALEIDOSCOPE
2.0000 mg | INTRAVITREAL | Status: AC | PRN
  Administered 2020-11-24: 2 mg via INTRAVITREAL

## 2020-11-24 NOTE — Assessment & Plan Note (Signed)
Component of CNVM, will likely improve with next injection

## 2020-11-24 NOTE — Progress Notes (Signed)
11/24/2020     CHIEF COMPLAINT Patient presents for Retina Follow Up (9 Wk F/U OD, poss Eylea OD//Pt denies noticeable changes to [place] OU since last visit. Pt denies ocular pain, flashes of light, or floaters OU. //)   HISTORY OF PRESENT ILLNESS: Jordan Hanna is a 60 y.o. male who presents to the clinic today for:   HPI    Retina Follow Up    Diagnosis: Wet AMD   Laterality: right eye   Onset: 9 weeks ago   Severity: mild   Duration: 9 weeks   Course: stable   Comments: 9 Wk F/U OD, poss Eylea OD  Pt denies noticeable changes to [place] OU since last visit. Pt denies ocular pain, flashes of light, or floaters OU.          Last edited by Rockie Neighbours, [place] on 11/24/2020  3:39 PM. (History)      Referring physician: Domingo Mend Darrick Penna, MD No address on file  HISTORICAL INFORMATION:   Selected notes from the [place]: No current outpatient medications on file. (Ophthalmic Drugs)   No current facility-administered medications for this visit. (Ophthalmic Drugs)   Current Outpatient Medications (Other)  Medication Sig  . amLODipine (NORVASC) 5 MG tablet Take by mouth.  Marland Kitchen eplerenone (INSPRA) 25 MG tablet Take 25 mg by mouth daily.  Marland Kitchen eplerenone (INSPRA) 50 MG tablet Take 50 mg by mouth daily.  Marland Kitchen epoetin alfa (EPOGEN) 10000 UNIT/ML injection 10,000 Units every 14 (fourteen) days.  . hydroxychloroquine (PLAQUENIL) 200 MG tablet Take by mouth.  . losartan (COZAAR) 50 MG tablet Take by mouth.  . mirtazapine (REMERON) 30 MG tablet Take 30 mg by mouth at bedtime.  . mycophenolate (CELLCEPT) 500 MG tablet   . riTUXimab-abbs ([place]) 500 MG/50ML injection Inject into the vein.  Marland Kitchen zolpidem (AMBIEN) 5 MG tablet Take 5 mg by mouth at bedtime as needed.   No current facility-administered medications for this visit. (Other)      REVIEW OF SYSTEMS:    ALLERGIES Allergies  Allergen Reactions  . Prednisone Swelling    Swelling  behind the eye that causes not being able to see    PAST MEDICAL HISTORY Past Medical History:  Diagnosis Date  . Anemia in chronic kidney disease (CKD)   . Chronic kidney disease   . Hypertension    Past Surgical History:  Procedure Laterality Date  . LASIK Bilateral 1999    FAMILY HISTORY Family History  Problem Relation Age of Onset  . Diabetes Mother   . Hypertension Father   . Diabetes Father     SOCIAL HISTORY Social History   Tobacco Use  . Smoking status: Never Smoker  . Smokeless tobacco: Never Used         OPHTHALMIC EXAM: Base Eye Exam    Visual Acuity (ETDRS)      Right Left   Dist [place] 20/60 +2 20/20   Dist ph [place] 20/50 +2        Tonometry (Tonopen, 3:44 PM)      Right Left   Pressure 14 13       Pupils      Pupils Dark Light Shape React APD   Right PERRL 6 5 Round Brisk None   Left PERRL 6 5 Round Brisk None       Visual Fields (Counting fingers)      Left Right    Full Full  Extraocular Movement      Right Left    Full Full       Neuro/Psych    Oriented x3: Yes   Mood/Affect: Normal       Dilation    Right eye: 1.0% Mydriacyl, 2.5% Phenylephrine @ 3:44 PM        Slit Lamp and Fundus Exam    External Exam      Right Left   External Normal Normal       Slit Lamp Exam      Right Left   Lids/Lashes 1+ Dermatochalasis - upper lid 1+ Dermatochalasis - upper lid   Conjunctiva/Sclera White and quiet White and quiet   Cornea Clear Clear   Anterior Chamber Deep and quiet Deep and quiet   Iris Round and reactive Round and reactive   Lens 1+ Nuclear sclerosis Clear   Anterior Vitreous Normal        Fundus Exam      Right Left   Posterior Vitreous Posterior vitreous detachment, Central vitreous floaters    Disc Normal    C/D Ratio 0.5    Macula Disciform scar, Retinal pigment epithelial mottling, Retinal pigment epithelial atrophy, no hemorrhage, no exudates, Subretinal fibrosis, Cystoid macular edema    Vessels  Normal    Periphery Normal           IMAGING AND PROCEDURES  Imaging and Procedures for 11/24/20  OCT, Retina - OU - Both Eyes       Right Eye Quality was good. Scan locations included subfoveal. Central Foveal Thickness: 337. Progression has worsened. Findings include abnormal foveal contour, cystoid macular edema.   Left Eye Quality was good. Scan locations included subfoveal. Central Foveal Thickness: 247. Progression has been stable. Findings include abnormal foveal contour, no SRF, no IRF.   Notes OD, much improved overall yet still subfoveal disciform scar with new and increasing intraretinal fluid and CME.  Currently at follow-up interval of 9 weeks post last Eylea       Intravitreal Injection, Pharmacologic Agent - OD - Right Eye       Time Out 11/24/2020. 4:32 PM. Confirmed correct patient, procedure, site, and patient consented.   Anesthesia Topical anesthesia was used. Anesthetic medications included [place] 3.5%.   Procedure Preparation included Tobramycin 0.3%, Ofloxacin , 5% betadine to ocular surface, 10% betadine to eyelids. A 30 gauge needle was used.   Injection:  2 mg aflibercept [name]   NDC: [id number], Lot: [id number]   Route: Intravitreal, Site: Right Eye, Waste: 0 mg  Post-op Post injection exam found visual acuity of at least counting fingers. The patient tolerated the procedure well. There were no complications. The patient received written and verbal post procedure care education. Post injection medications were not given.                 ASSESSMENT/PLAN:  Exudative age-related macular degeneration of right eye with active choroidal neovascularization (HCC) OD overall stable yet with minor recurrence of intraretinal fluid and CME will need repeat injection Eylea today  Nuclear sclerotic cataract of right eye Minor OD today observe  Cystoid macular edema of right eye Component of CNVM, will likely improve with next  injection      ICD-10-CM   1. Exudative age-related macular degeneration of right eye with active choroidal neovascularization (HCC)  H35.3211 OCT, Retina - OU - Both Eyes    Intravitreal Injection, Pharmacologic Agent - OD - Right Eye    aflibercept (EYLEA) SOLN 2 mg  2. Nuclear sclerotic cataract of right eye  H25.11   3. Cystoid macular edema of right eye  H35.351     1.  OD, chronic active CNVM disciform scar with recurrence of CME at 9-week follow-up.  Post Eylea.  We will repeat injection today and examination again in 8 weeks.  2.  3.  Ophthalmic Meds Ordered this visit:  Meds ordered this encounter  Medications  . aflibercept (EYLEA) SOLN 2 mg       Return in about 8 weeks (around 01/19/2021) for dilate, OD, EYLEA OCT.  There are no Patient Instructions on file for this visit.   Explained the diagnoses, plan, and follow up with the patient and they expressed understanding.  Patient expressed understanding of the importance of proper follow up care.   Clent Demark Star Resler M.D. Diseases & Surgery of the Retina and Vitreous Retina & Diabetic [place] 11/24/20     Abbreviations: M myopia (nearsighted); A astigmatism; H hyperopia (farsighted); P presbyopia; Mrx spectacle prescription;  CTL contact lenses; OD right eye; OS left eye; OU both eyes  XT exotropia; ET esotropia; PEK punctate epithelial keratitis; PEE punctate epithelial erosions; DES dry eye syndrome; MGD meibomian gland dysfunction; ATs artificial tears; PFAT's preservative free artificial tears; [place] nuclear sclerotic cataract; PSC posterior subcapsular cataract; ERM epi-retinal membrane; PVD posterior vitreous detachment; RD retinal detachment; DM diabetes mellitus; DR diabetic retinopathy; NPDR non-proliferative diabetic retinopathy; PDR proliferative diabetic retinopathy; CSME clinically significant macular edema; DME diabetic macular edema; dbh dot blot hemorrhages; CWS cotton wool spot; POAG primary open angle  glaucoma; C/D cup-to-disc ratio; HVF humphrey visual field; GVF goldmann visual field; OCT optical coherence tomography; IOP intraocular pressure; BRVO Branch retinal vein occlusion; CRVO central retinal vein occlusion; CRAO central retinal artery occlusion; BRAO branch retinal artery occlusion; RT retinal tear; SB scleral buckle; PPV pars plana vitrectomy; VH Vitreous hemorrhage; PRP panretinal laser photocoagulation; IVK intravitreal kenalog; VMT vitreomacular traction; MH Macular hole;  NVD neovascularization of the disc; NVE neovascularization elsewhere; AREDS age related eye disease study; ARMD age related macular degeneration; POAG primary open angle glaucoma; EBMD epithelial/anterior basement membrane dystrophy; ACIOL anterior chamber intraocular lens; IOL intraocular lens; PCIOL posterior chamber intraocular lens; Phaco/IOL phacoemulsification with intraocular lens placement; [place] photorefractive keratectomy; LASIK laser assisted in situ keratomileusis; HTN hypertension; DM diabetes mellitus; COPD chronic obstructive pulmonary disease

## 2020-11-24 NOTE — Assessment & Plan Note (Signed)
OD overall stable yet with minor recurrence of intraretinal fluid and CME will need repeat injection Eylea today

## 2020-11-24 NOTE — Assessment & Plan Note (Signed)
Minor OD today observe

## 2020-12-12 DIAGNOSIS — N280 Ischemia and infarction of kidney: Secondary | ICD-10-CM | POA: Insufficient documentation

## 2020-12-21 DIAGNOSIS — I8290 Acute embolism and thrombosis of unspecified vein: Secondary | ICD-10-CM

## 2020-12-21 HISTORY — DX: Acute embolism and thrombosis of unspecified vein: I82.90

## 2020-12-28 DIAGNOSIS — D509 Iron deficiency anemia, unspecified: Secondary | ICD-10-CM | POA: Insufficient documentation

## 2021-01-19 ENCOUNTER — Encounter (HOSPITAL_COMMUNITY): Payer: Self-pay

## 2021-01-19 ENCOUNTER — Ambulatory Visit (INDEPENDENT_AMBULATORY_CARE_PROVIDER_SITE_OTHER): Payer: Commercial Managed Care - PPO | Admitting: Ophthalmology

## 2021-01-19 ENCOUNTER — Ambulatory Visit (HOSPITAL_COMMUNITY)
Admission: EM | Admit: 2021-01-19 | Discharge: 2021-01-19 | Disposition: A | Discharge status: Home / Self Care | Payer: Commercial Managed Care - PPO | Attending: Emergency Medicine | Admitting: Emergency Medicine

## 2021-01-19 ENCOUNTER — Encounter (INDEPENDENT_AMBULATORY_CARE_PROVIDER_SITE_OTHER): Payer: Self-pay | Admitting: Ophthalmology

## 2021-01-19 ENCOUNTER — Other Ambulatory Visit: Payer: Self-pay

## 2021-01-19 DIAGNOSIS — H353211 Exudative age-related macular degeneration, right eye, with active choroidal neovascularization: Secondary | ICD-10-CM

## 2021-01-19 DIAGNOSIS — H35712 Central serous chorioretinopathy, left eye: Secondary | ICD-10-CM

## 2021-01-19 DIAGNOSIS — H2511 Age-related nuclear cataract, right eye: Secondary | ICD-10-CM | POA: Diagnosis not present

## 2021-01-19 DIAGNOSIS — S86912A Strain of unspecified muscle(s) and tendon(s) at lower leg level, left leg, initial encounter: Secondary | ICD-10-CM

## 2021-01-19 MED ORDER — AFLIBERCEPT 2MG/0.05ML IZ SOLN FOR KALEIDOSCOPE
2.0000 mg | INTRAVITREAL | Status: AC | PRN
  Administered 2021-01-19: 2 mg via INTRAVITREAL

## 2021-01-19 NOTE — ED Provider Notes (Signed)
[place]    CSN: [id number] Arrival date & time: 01/19/21  1613      History   Chief Complaint Chief Complaint  Patient presents with   Knee Pain    HPI Jordan Hanna is a 60 y.o. male.   Patient here for evaluation of left knee pain that has been ongoing for the past several days.  Reports pain to lateral knee that is worse with movement and standing.  States recently hospitalized and then diagnosed with a blood clot of the right leg and has therefore been using left leg more.  Patient is taking Eliquis for blood clots.  Has not taken any OTC medications or treatments.  Denies any trauma, injury, or other precipitating event.  Denies any fevers, chest pain, shortness of breath, N/V/D, numbness, tingling, weakness, abdominal pain, or headaches.     The history is provided by the patient.  Knee Pain  Past Medical History:  Diagnosis Date   Anemia in chronic kidney disease (CKD)    Chronic kidney disease    Hypertension     Patient Active Problem List   Diagnosis Date Noted   Central serous retinopathy, left 07/14/2020   Anemia in chronic kidney disease (CKD) 06/23/2020   Exudative age-related macular degeneration of right eye with active choroidal neovascularization ([place]) 01/11/2020   Posterior vitreous detachment of right eye 10/26/2019   Nuclear sclerotic cataract of right eye 10/26/2019   Cystoid macular edema of right eye 10/26/2019    Past Surgical History:  Procedure Laterality Date   LASIK Bilateral 1999       Home Medications    Prior to Admission medications   Medication Sig Start Date End Date Taking? Authorizing Provider  amLODipine (NORVASC) 5 MG tablet Take by mouth.    [provider]  ELIQUIS 5 MG TABS tablet Take 5 mg by mouth 2 (two) times daily. 01/04/21   [provider]  eplerenone (INSPRA) 25 MG tablet Take 25 mg by mouth daily. 09/17/19   [provider]  eplerenone (INSPRA) 50 MG tablet Take 50 mg  by mouth daily. 09/06/20   [provider]  epoetin alfa (EPOGEN) 10000 UNIT/ML injection 10,000 Units every 14 (fourteen) days.    [provider]  hydroxychloroquine (PLAQUENIL) 200 MG tablet Take by mouth. 06/21/16   [provider]  losartan (COZAAR) 50 MG tablet Take by mouth.    [provider]  mirtazapine (REMERON) 30 MG tablet Take 30 mg by mouth at bedtime. 08/04/19   [provider]  mycophenolate (CELLCEPT) 500 MG tablet  10/12/19   [provider]  riTUXimab-abbs ([place]) 500 MG/50ML injection Inject into the vein.    [provider]  zolpidem (AMBIEN) 5 MG tablet Take 5 mg by mouth at bedtime as needed. 10/19/19   [provider]    Family History Family History  Problem Relation Age of Onset   Diabetes Mother    Hypertension Father    Diabetes Father     Social History Social History   Tobacco Use   Smoking status: Never   Smokeless tobacco: Never     Allergies   Prednisone   Review of Systems Review of Systems  Musculoskeletal:  Positive for arthralgias and joint swelling.  All other systems reviewed and are negative.   Physical Exam Triage Vital Signs ED Triage Vitals  Enc Vitals Group     BP 01/19/21 1721 (!) 145/95     Pulse Rate 01/19/21 1721 74  Resp 01/19/21 1721 18     Temp 01/19/21 1721 98.9 F (37.2 C)     Temp Source 01/19/21 1721 Oral     SpO2 01/19/21 1721 100 %     Weight --      Height --      Head Circumference --      Peak Flow --      Pain Score 01/19/21 1719 6     Pain Loc --      Pain Edu? --      Excl. in [place]? --    No data found.  Updated Vital Signs BP (!) 145/95 (BP Location: Right Arm)   Pulse 74   Temp 98.9 F (37.2 C) (Oral)   Resp 18   SpO2 100%   Visual Acuity Right Eye Distance:   Left Eye Distance:   Bilateral Distance:    Right Eye Near:   Left Eye Near:    Bilateral Near:     Physical Exam Vitals and nursing note reviewed.   Constitutional:      General: He is not in acute distress.    Appearance: Normal appearance. He is not ill-appearing, toxic-appearing or diaphoretic.  HENT:     Head: Normocephalic and atraumatic.  Eyes:     Conjunctiva/sclera: Conjunctivae normal.  Cardiovascular:     Rate and Rhythm: Normal rate.     Pulses: Normal pulses.  Pulmonary:     Effort: Pulmonary effort is normal.  Abdominal:     General: Abdomen is flat.  Musculoskeletal:        General: Normal range of motion.     Cervical back: Normal range of motion.     Left knee: No swelling, deformity, bony tenderness or crepitus. Tenderness present over the lateral joint line. No patellar tendon tenderness. No LCL laxity, MCL laxity, ACL laxity or PCL laxity.Normal alignment, normal meniscus and normal patellar mobility. Normal pulse.     Instability Tests: Anterior drawer test negative. Posterior drawer test negative. Anterior Lachman test negative. Medial McMurray test negative and lateral McMurray test negative.     Left lower leg: Normal. No swelling, tenderness or bony tenderness. No edema.  Skin:    General: Skin is warm and dry.  Neurological:     General: No focal deficit present.     Mental Status: He is alert and oriented to person, place, and time.  Psychiatric:        Mood and Affect: Mood normal.     UC Treatments / Results  Labs (all labs ordered are listed, but only abnormal results are displayed) Labs Reviewed - No data to display  EKG   Radiology Intravitreal Injection, Pharmacologic Agent - OD - Right Eye  Result Date: 01/19/2021 Time Out 01/19/2021. 3:57 PM. Confirmed correct patient, procedure, site, and patient consented. Anesthesia Topical anesthesia was used. Anesthetic medications included Akten 3.5%. Procedure Preparation included Tobramycin 0.3%, Ofloxacin , 5% betadine to ocular surface, 10% betadine to eyelids. A 30 gauge needle was used. Injection: 2 mg aflibercept 2 MG/0.05ML   Route:  Intravitreal, Site: Right Eye   NDC: [id number], Lot: [id number], Waste: 0 mL Post-op Post injection exam found visual acuity of at least counting fingers. The patient tolerated the procedure well. There were no complications. The patient received written and verbal post procedure care education. Post injection medications were not given.   OCT, Retina - OU - Both Eyes  Result Date: 01/19/2021 Right Eye Quality was good. Scan locations included subfoveal. Central Foveal  Thickness: 432. Progression has worsened. Findings include abnormal foveal contour, cystoid macular edema, disciform scar. Left Eye Quality was good. Scan locations included subfoveal. Central Foveal Thickness: 256. Progression has been stable. Findings include abnormal foveal contour, no IRF, subretinal fluid. Notes OD, increase in intraretinal fluid and CME OD even at 8-week follow-up interval today post Eylea injection OS with minor subretinal fluid not in a foveal location as patient continues on eplerenone 25 mg daily   Procedures Procedures (including critical care time)  Medications Ordered in UC Medications - No data to display  Initial Impression / Assessment and Plan / UC Course  I have reviewed the triage vital signs and the nursing notes.  Pertinent labs & imaging results that were available during my care of the patient were reviewed by me and considered in my medical decision making (see chart for details).    Assessment negative flags or concerns.  This is likely knee strain from overuse.  Blood clot is unlikely as patient is already taking Eliquis.  Ace bandage applied in office and may wear Ace bandage for comfort.  Recommend ibuprofen and/or Tylenol as needed for pain and fevers.  May use OTC creams for comfort.  Recommend rest, ice, compression, and elevation.  May also try heat or alternating heat and ice.  Follow-up with primary care or orthopedics if symptoms do not improve. Final Clinical Impressions(s) /  UC Diagnoses   Final diagnoses:  Knee strain, left, initial encounter     Discharge Instructions      You can take Ibuprofen and/or Tylenol as needed for for pain and fevers.    You can apply Icy/Hot or Voltaren cream for comfort.   Rest as much as possible Ice for 10-15 minutes every 4-6 hours as needed for pain and swelling.  You can also try heat or alternate between heat and ice.  Compression- use an ace bandage or splint for comfort Elevate above your hip/heart when sitting and laying down.   Return or go to the Emergency Department if symptoms worsen or do not improve in the next few days.      ED Prescriptions   None    PDMP not reviewed this encounter.   Pearson Forster, NP 01/19/21 [phone]

## 2021-01-19 NOTE — Progress Notes (Signed)
01/19/2021     CHIEF COMPLAINT Patient presents for Retina Follow Up (8 Wk F/U OD, poss Eylea OD//Pt reports gradually decreasing VA OD. Pt denies any other new symptoms OU.)   HISTORY OF PRESENT ILLNESS: Jordan Hanna is a 60 y.o. male who presents to the clinic today for:   HPI     Retina Follow Up           Diagnosis: Wet AMD   Laterality: right eye   Onset: 8 weeks ago   Severity: moderate   Duration: 8 weeks   Course: gradually worsening   Comments: 8 Wk F/U OD, poss Eylea OD  Pt reports gradually decreasing VA OD. Pt denies any other new symptoms OU.       Last edited by Milly Jakob, [place] on 01/19/2021  2:59 PM.      Referring physician: Domingo Mend Darrick Penna, MD No address on file  HISTORICAL INFORMATION:   Selected notes from the [place]: No current outpatient medications on file. (Ophthalmic Drugs)   No current facility-administered medications for this visit. (Ophthalmic Drugs)   Current Outpatient Medications (Other)  Medication Sig   amLODipine (NORVASC) 5 MG tablet Take by mouth.   ELIQUIS 5 MG TABS tablet Take 5 mg by mouth 2 (two) times daily.   eplerenone (INSPRA) 25 MG tablet Take 25 mg by mouth daily.   eplerenone (INSPRA) 50 MG tablet Take 50 mg by mouth daily.   epoetin alfa (EPOGEN) 10000 UNIT/ML injection 10,000 Units every 14 (fourteen) days.   hydroxychloroquine (PLAQUENIL) 200 MG tablet Take by mouth.   losartan (COZAAR) 50 MG tablet Take by mouth.   mirtazapine (REMERON) 30 MG tablet Take 30 mg by mouth at bedtime.   mycophenolate (CELLCEPT) 500 MG tablet    riTUXimab-abbs (TRUXIMA) 500 MG/50ML injection Inject into the vein.   zolpidem (AMBIEN) 5 MG tablet Take 5 mg by mouth at bedtime as needed.   No current facility-administered medications for this visit. (Other)      REVIEW OF SYSTEMS:    ALLERGIES Allergies  Allergen Reactions   Prednisone Swelling    Swelling behind  the eye that causes not being able to see    PAST MEDICAL HISTORY Past Medical History:  Diagnosis Date   Anemia in chronic kidney disease (CKD)    Chronic kidney disease    Hypertension    Past Surgical History:  Procedure Laterality Date   LASIK Bilateral 1999    FAMILY HISTORY Family History  Problem Relation Age of Onset   Diabetes Mother    Hypertension Father    Diabetes Father     SOCIAL HISTORY Social History   Tobacco Use   Smoking status: Never   Smokeless tobacco: Never         OPHTHALMIC EXAM:  Base Eye Exam     Visual Acuity (ETDRS)       Right Left   Dist [place] 20/60 -1 20/20 -2   Dist ph [place] NI          Tonometry (Tonopen, 3:03 PM)       Right Left   Pressure 10 10         Pupils       Pupils Dark Light Shape React APD   Right PERRL 5 4 Round Brisk None   Left PERRL 5 4 Round Brisk None         Visual Fields (Counting fingers)  Left Right    Full Full         Extraocular Movement       Right Left    Full Full         Neuro/Psych     Oriented x3: Yes   Mood/Affect: Normal         Dilation     Right eye: 1.0% Mydriacyl, 2.5% Phenylephrine @ 3:03 PM           Slit Lamp and Fundus Exam     External Exam       Right Left   External Normal Normal         Slit Lamp Exam       Right Left   Lids/Lashes 1+ Dermatochalasis - upper lid 1+ Dermatochalasis - upper lid   Conjunctiva/Sclera White and quiet White and quiet   Cornea Clear Clear   Anterior Chamber Deep and quiet Deep and quiet   Iris Round and reactive Round and reactive   Lens 1+ Nuclear sclerosis Clear   Anterior Vitreous Normal          Fundus Exam       Right Left   Posterior Vitreous Posterior vitreous detachment, Central vitreous floaters    Disc Normal    C/D Ratio 0.5    Macula Disciform scar, Retinal pigment epithelial mottling, Retinal pigment epithelial atrophy, no hemorrhage, no exudates, Subretinal fibrosis,  Cystoid macular edema    Vessels Normal    Periphery Normal             IMAGING AND PROCEDURES  Imaging and Procedures for 01/19/21  OCT, Retina - OU - Both Eyes       Right Eye Quality was good. Scan locations included subfoveal. Central Foveal Thickness: 432. Progression has worsened. Findings include abnormal foveal contour, cystoid macular edema, disciform scar.   Left Eye Quality was good. Scan locations included subfoveal. Central Foveal Thickness: 256. Progression has been stable. Findings include abnormal foveal contour, no IRF, subretinal fluid.   Notes OD, increase in intraretinal fluid and CME OD even at 8-week follow-up interval today post Eylea injection    OS with minor subretinal fluid not in a foveal location as patient continues on eplerenone 25 mg daily     Intravitreal Injection, Pharmacologic Agent - OD - Right Eye       Time Out 01/19/2021. 3:57 PM. Confirmed correct patient, procedure, site, and patient consented.   Anesthesia Topical anesthesia was used. Anesthetic medications included Akten 3.5%.   Procedure Preparation included Tobramycin 0.3%, Ofloxacin , 5% betadine to ocular surface, 10% betadine to eyelids. A 30 gauge needle was used.   Injection: 2 mg aflibercept 2 MG/0.05ML   Route: Intravitreal, Site: Right Eye   NDC: [id number], Lot: [id number], Waste: 0 mL   Post-op Post injection exam found visual acuity of at least counting fingers. The patient tolerated the procedure well. There were no complications. The patient received written and verbal post procedure care education. Post injection medications were not given.              ASSESSMENT/PLAN:  Central serous retinopathy, left Patient continues on eplerenone 25 mg daily, no subretinal fluid in the foveal region  Exudative age-related macular degeneration of right eye with active choroidal neovascularization (HCC) Slight increase in intraretinal fluid and cystoid  macular edema the right eye at 8-week follow-up today concernedly of injection.  We will Pete injection today and follow-up in 7-week  Nuclear sclerotic cataract of  right eye Mild not visually significant     ICD-10-CM   1. Exudative age-related macular degeneration of right eye with active choroidal neovascularization (HCC)  H35.3211 OCT, Retina - OU - Both Eyes    Intravitreal Injection, Pharmacologic Agent - OD - Right Eye    aflibercept (EYLEA) SOLN 2 mg    2. Central serous retinopathy, left  H35.712     3. Nuclear sclerotic cataract of right eye  H25.11       1.  CNVM increased activity at 8-week follow-up post Eylea injection.  Will need repeat injection today and reexamination again in 7 weeks  2.  3.  Ophthalmic Meds Ordered this visit:  Meds ordered this encounter  Medications   aflibercept (EYLEA) SOLN 2 mg        Return in about 7 weeks (around 03/09/2021) for dilate, OD, EYLEA OCT.  There are no Patient Instructions on file for this visit.   Explained the diagnoses, plan, and follow up with the patient and they expressed understanding.  Patient expressed understanding of the importance of proper follow up care.   Clent Demark Damyah Gugel M.D. Diseases & Surgery of the Retina and Vitreous Retina & Diabetic [place] 01/19/21     Abbreviations: M myopia (nearsighted); A astigmatism; H hyperopia (farsighted); P presbyopia; Mrx spectacle prescription;  CTL contact lenses; OD right eye; OS left eye; OU both eyes  XT exotropia; ET esotropia; PEK punctate epithelial keratitis; PEE punctate epithelial erosions; DES dry eye syndrome; MGD meibomian gland dysfunction; ATs artificial tears; PFAT's preservative free artificial tears; [place] nuclear sclerotic cataract; PSC posterior subcapsular cataract; ERM epi-retinal membrane; PVD posterior vitreous detachment; RD retinal detachment; DM diabetes mellitus; DR diabetic retinopathy; NPDR non-proliferative diabetic retinopathy; PDR  proliferative diabetic retinopathy; CSME clinically significant macular edema; DME diabetic macular edema; dbh dot blot hemorrhages; CWS cotton wool spot; POAG primary open angle glaucoma; C/D cup-to-disc ratio; HVF humphrey visual field; GVF goldmann visual field; OCT optical coherence tomography; IOP intraocular pressure; BRVO Branch retinal vein occlusion; CRVO central retinal vein occlusion; CRAO central retinal artery occlusion; BRAO branch retinal artery occlusion; RT retinal tear; SB scleral buckle; PPV pars plana vitrectomy; VH Vitreous hemorrhage; PRP panretinal laser photocoagulation; IVK intravitreal kenalog; VMT vitreomacular traction; MH Macular hole;  NVD neovascularization of the disc; NVE neovascularization elsewhere; AREDS age related eye disease study; ARMD age related macular degeneration; POAG primary open angle glaucoma; EBMD epithelial/anterior basement membrane dystrophy; ACIOL anterior chamber intraocular lens; IOL intraocular lens; PCIOL posterior chamber intraocular lens; Phaco/IOL phacoemulsification with intraocular lens placement; [place] photorefractive keratectomy; LASIK laser assisted in situ keratomileusis; HTN hypertension; DM diabetes mellitus; COPD chronic obstructive pulmonary disease

## 2021-01-19 NOTE — Discharge Instructions (Addendum)
You can take Ibuprofen and/or Tylenol as needed for for pain and fevers.    You can apply Icy/Hot or Voltaren cream for comfort.   Rest as much as possible Ice for 10-15 minutes every 4-6 hours as needed for pain and swelling.  You can also try heat or alternate between heat and ice.  Compression- use an ace bandage or splint for comfort Elevate above your hip/heart when sitting and laying down.   Return or go to the Emergency Department if symptoms worsen or do not improve in the next few days.

## 2021-01-19 NOTE — Assessment & Plan Note (Signed)
Patient continues on eplerenone 25 mg daily, no subretinal fluid in the foveal region

## 2021-01-19 NOTE — Assessment & Plan Note (Signed)
Mild not visually significant

## 2021-01-19 NOTE — ED Triage Notes (Signed)
Pt present left knee pain, pt states symptoms started two days ago. Pt denies any injury to the knee.

## 2021-01-19 NOTE — Assessment & Plan Note (Signed)
Slight increase in intraretinal fluid and cystoid macular edema the right eye at 8-week follow-up today concernedly of injection.  We will Pete injection today and follow-up in 7-week

## 2021-01-27 ENCOUNTER — Other Ambulatory Visit: Payer: Self-pay | Admitting: *Deleted

## 2021-01-27 DIAGNOSIS — M5412 Radiculopathy, cervical region: Secondary | ICD-10-CM

## 2021-01-31 ENCOUNTER — Other Ambulatory Visit: Payer: Self-pay | Admitting: *Deleted

## 2021-01-31 DIAGNOSIS — M25562 Pain in left knee: Secondary | ICD-10-CM

## 2021-03-09 ENCOUNTER — Encounter (INDEPENDENT_AMBULATORY_CARE_PROVIDER_SITE_OTHER): Payer: Self-pay | Admitting: Ophthalmology

## 2021-03-09 ENCOUNTER — Other Ambulatory Visit: Payer: Self-pay

## 2021-03-09 ENCOUNTER — Ambulatory Visit (INDEPENDENT_AMBULATORY_CARE_PROVIDER_SITE_OTHER): Payer: Commercial Managed Care - PPO | Admitting: Ophthalmology

## 2021-03-09 DIAGNOSIS — H353211 Exudative age-related macular degeneration, right eye, with active choroidal neovascularization: Secondary | ICD-10-CM | POA: Diagnosis not present

## 2021-03-09 DIAGNOSIS — H35712 Central serous chorioretinopathy, left eye: Secondary | ICD-10-CM

## 2021-03-09 MED ORDER — AFLIBERCEPT 2MG/0.05ML IZ SOLN FOR KALEIDOSCOPE
2.0000 mg | INTRAVITREAL | Status: AC | PRN
  Administered 2021-03-09: 2 mg via INTRAVITREAL

## 2021-03-09 NOTE — Assessment & Plan Note (Signed)
Improved CNVM activity at 7-week interval post Eylea thus this is a CNVM sensitive to treatment lesion.  We will repeat injection today and maintain 7-week follow-up

## 2021-03-09 NOTE — Assessment & Plan Note (Signed)
Central serous retinopathy now has recurred coincident with onset again of uremia with failing renal function.  Patient currently on 25 mg and eplerenone, and will contact [facility], renal team to determine whether or not 50 mg would be allowed so as to slow or prevent progression of worsening of CSCR

## 2021-03-09 NOTE — Progress Notes (Signed)
03/09/2021     CHIEF COMPLAINT Patient presents for No chief complaint on file.   HISTORY OF PRESENT ILLNESS: Jordan Hanna is a 60 y.o. male who presents to the clinic today for:   HPI   7 wk fu od oct eylea od Pt. States "2 weeks ago I noticed a change. I could read the bottom of t.v. screen. It seems difficult to read the black on white and the white on red like on the news channel. I have noticed "sun- blindness" in the last week when I come inside it looks pink." Denies new floaters, FOL.   Last edited by Laurin Coder, COA on 03/09/2021  3:04 PM.      Referring physician: Dorthula Perfect, MD No address on file  HISTORICAL INFORMATION:   Selected notes from the [facility]: No current outpatient medications on file. (Ophthalmic Drugs)   No current facility-administered medications for this visit. (Ophthalmic Drugs)   Current Outpatient Medications (Other)  Medication Sig   amLODipine (NORVASC) 5 MG tablet Take by mouth.   ELIQUIS 5 MG TABS tablet Take 5 mg by mouth 2 (two) times daily.   eplerenone (INSPRA) 25 MG tablet Take 25 mg by mouth daily.   eplerenone (INSPRA) 50 MG tablet Take 50 mg by mouth daily.   epoetin alfa (EPOGEN) 10000 UNIT/ML injection 10,000 Units every 14 (fourteen) days.   hydroxychloroquine (PLAQUENIL) 200 MG tablet Take by mouth.   losartan (COZAAR) 50 MG tablet Take by mouth.   mirtazapine (REMERON) 30 MG tablet Take 30 mg by mouth at bedtime.   mycophenolate (CELLCEPT) 500 MG tablet    riTUXimab-abbs (TRUXIMA) 500 MG/50ML injection Inject into the vein.   zolpidem (AMBIEN) 5 MG tablet Take 5 mg by mouth at bedtime as needed.   No current facility-administered medications for this visit. (Other)      REVIEW OF SYSTEMS:    ALLERGIES Allergies  Allergen Reactions   Prednisone Swelling    Swelling behind the eye that causes not being able to see    PAST MEDICAL HISTORY Past  Medical History:  Diagnosis Date   Anemia in chronic kidney disease (CKD)    Chronic kidney disease    Hypertension    Past Surgical History:  Procedure Laterality Date   LASIK Bilateral 1999    FAMILY HISTORY Family History  Problem Relation Age of Onset   Diabetes Mother    Hypertension Father    Diabetes Father     SOCIAL HISTORY Social History   Tobacco Use   Smoking status: Never   Smokeless tobacco: Never         OPHTHALMIC EXAM:  Base Eye Exam     Visual Acuity (ETDRS)       Right Left   Dist [place] 20/50 -2+2 20/25 +2   Dist ph  20/40          Tonometry (Tonopen, 3:08 PM)       Right Left   Pressure 11 12         Pupils       Pupils Dark Light Shape React APD   Right PERRL 5 3 Round Brisk None   Left PERRL 5 3 Round Brisk None         Visual Fields (Counting fingers)       Left Right    Full Full         Extraocular Movement  Right Left    Full Full         Neuro/Psych     Oriented x3: Yes   Mood/Affect: Normal         Dilation     Right eye: 1.0% Mydriacyl, 2.5% Phenylephrine @ 3:08 PM           Slit Lamp and Fundus Exam     External Exam       Right Left   External Normal Normal         Slit Lamp Exam       Right Left   Lids/Lashes 1+ Dermatochalasis - upper lid 1+ Dermatochalasis - upper lid   Conjunctiva/Sclera White and quiet White and quiet   Cornea Clear Clear   Anterior Chamber Deep and quiet Deep and quiet   Iris Round and reactive Round and reactive   Lens 1+ Nuclear sclerosis Clear   Anterior Vitreous Normal          Fundus Exam       Right Left   Posterior Vitreous Posterior vitreous detachment, Central vitreous floaters    Disc Normal    C/D Ratio 0.5    Macula Disciform scar, Retinal pigment epithelial mottling, Retinal pigment epithelial atrophy, no hemorrhage, no exudates, Subretinal fibrosis, Cystoid macular edema    Vessels Normal    Periphery Normal              IMAGING AND PROCEDURES  Imaging and Procedures for 03/09/21  OCT, Retina - OU - Both Eyes       Right Eye Quality was good. Scan locations included subfoveal. Central Foveal Thickness: 331. Progression has improved. Findings include abnormal foveal contour.   Left Eye Quality was good. Scan locations included subfoveal. Central Foveal Thickness: 269. Progression has worsened. Findings include abnormal foveal contour.   Notes OD with much less intraretinal fluid overlying CNVM at 7 weeks post Eylea thus this is a CNVM sensitive to Eylea condition OD will repeat injection today  OS, with new subfoveal serous elevation CSCR type elevation, coincident with again with onset of renal failure and uremia.  This is coincident with his visual symptomatology of delayed light adaptation  in changing light scenarios     Intravitreal Injection, Pharmacologic Agent - OD - Right Eye       Time Out 03/09/2021. 3:51 PM. Confirmed correct patient, procedure, site, and patient consented.   Anesthesia Topical anesthesia was used. Anesthetic medications included Akten 3.5%.   Procedure Preparation included Tobramycin 0.3%, Ofloxacin , 5% betadine to ocular surface, 10% betadine to eyelids. A 30 gauge needle was used.   Injection: 2 mg aflibercept 2 MG/0.05ML   Route: Intravitreal, Site: Right Eye   NDC: [id number], Lot: [id number], Waste: 0 mL   Post-op Post injection exam found visual acuity of at least counting fingers. The patient tolerated the procedure well. There were no complications. The patient received written and verbal post procedure care education. Post injection medications were not given.              ASSESSMENT/PLAN:  Exudative age-related macular degeneration of right eye with active choroidal neovascularization (HCC) Improved CNVM activity at 7-week interval post Eylea thus this is a CNVM sensitive to treatment lesion.  We will repeat injection today and maintain  7-week follow-up  Central serous retinopathy, left Central serous retinopathy now has recurred coincident with onset again of uremia with failing renal function.  Patient currently on 25 mg and eplerenone, and will contact [facility]  medical, renal team to determine whether or not 50 mg would be allowed so as to slow or prevent progression of worsening of CSCR     ICD-10-CM   1. Exudative age-related macular degeneration of right eye with active choroidal neovascularization (HCC)  H35.3211 OCT, Retina - OU - Both Eyes    Intravitreal Injection, Pharmacologic Agent - OD - Right Eye    aflibercept (EYLEA) SOLN 2 mg    2. Central serous retinopathy, left  H35.712       1.  OD with antivegF sensitive CNVM activity noted on 01-19-2021.  Improved as of today at 7-week follow-up.  We will repeat injection today to main visual functioning, and follow-up dilate examination right eye in 7 weeks  2.  OS with new symptomatology of delayed light at dictation, coincident with new findings OS serous retinal elevation of the foveal location.  This is similar to previous findings when uremia was progressing.  Currently coincident with uremia developing again.  3.  Patient to inquire at [facility] whether or not he can increase his dose currently of eplerenone 25 mg to 50 mg daily so as to hopefully diminish the impact of central serous retinopathy developing further in the left eye  Ophthalmic Meds Ordered this visit:  Meds ordered this encounter  Medications   aflibercept (EYLEA) SOLN 2 mg       Return in about 7 weeks (around 04/27/2021) for dilate, OD, EYLEA OCT.  There are no Patient Instructions on file for this visit.   Explained the diagnoses, plan, and follow up with the patient and they expressed understanding.  Patient expressed understanding of the importance of proper follow up care.   Clent Demark Bana Borgmeyer M.D. Diseases & Surgery of the Retina and Vitreous Retina & Diabetic [place] 03/09/21     Abbreviations: M myopia (nearsighted); A astigmatism; H hyperopia (farsighted); P presbyopia; Mrx spectacle prescription;  CTL contact lenses; OD right eye; OS left eye; OU both eyes  XT exotropia; ET esotropia; PEK punctate epithelial keratitis; PEE punctate epithelial erosions; DES dry eye syndrome; MGD meibomian gland dysfunction; ATs artificial tears; PFAT's preservative free artificial tears; [place] nuclear sclerotic cataract; PSC posterior subcapsular cataract; ERM epi-retinal membrane; PVD posterior vitreous detachment; RD retinal detachment; DM diabetes mellitus; DR diabetic retinopathy; NPDR non-proliferative diabetic retinopathy; PDR proliferative diabetic retinopathy; CSME clinically significant macular edema; DME diabetic macular edema; dbh dot blot hemorrhages; CWS cotton wool spot; POAG primary open angle glaucoma; C/D cup-to-disc ratio; HVF humphrey visual field; GVF goldmann visual field; OCT optical coherence tomography; IOP intraocular pressure; BRVO Branch retinal vein occlusion; CRVO central retinal vein occlusion; CRAO central retinal artery occlusion; BRAO branch retinal artery occlusion; RT retinal tear; SB scleral buckle; PPV pars plana vitrectomy; VH Vitreous hemorrhage; PRP panretinal laser photocoagulation; IVK intravitreal kenalog; VMT vitreomacular traction; MH Macular hole;  NVD neovascularization of the disc; NVE neovascularization elsewhere; AREDS age related eye disease study; ARMD age related macular degeneration; POAG primary open angle glaucoma; EBMD epithelial/anterior basement membrane dystrophy; ACIOL anterior chamber intraocular lens; IOL intraocular lens; PCIOL posterior chamber intraocular lens; Phaco/IOL phacoemulsification with intraocular lens placement; [place] photorefractive keratectomy; LASIK laser assisted in situ keratomileusis; HTN hypertension; DM diabetes mellitus; COPD chronic obstructive pulmonary disease

## 2021-04-27 ENCOUNTER — Encounter (INDEPENDENT_AMBULATORY_CARE_PROVIDER_SITE_OTHER): Payer: Commercial Managed Care - PPO | Admitting: Ophthalmology

## 2021-05-01 ENCOUNTER — Encounter (INDEPENDENT_AMBULATORY_CARE_PROVIDER_SITE_OTHER): Payer: Self-pay | Admitting: Ophthalmology

## 2021-05-01 ENCOUNTER — Other Ambulatory Visit: Payer: Self-pay

## 2021-05-01 ENCOUNTER — Ambulatory Visit (INDEPENDENT_AMBULATORY_CARE_PROVIDER_SITE_OTHER): Payer: Commercial Managed Care - PPO | Admitting: Ophthalmology

## 2021-05-01 ENCOUNTER — Encounter (INDEPENDENT_AMBULATORY_CARE_PROVIDER_SITE_OTHER): Payer: Commercial Managed Care - PPO | Admitting: Ophthalmology

## 2021-05-01 DIAGNOSIS — H35712 Central serous chorioretinopathy, left eye: Secondary | ICD-10-CM

## 2021-05-01 DIAGNOSIS — H353211 Exudative age-related macular degeneration, right eye, with active choroidal neovascularization: Secondary | ICD-10-CM | POA: Diagnosis not present

## 2021-05-01 MED ORDER — AFLIBERCEPT 2MG/0.05ML IZ SOLN FOR KALEIDOSCOPE
2.0000 mg | INTRAVITREAL | Status: AC | PRN
  Administered 2021-05-01: 2 mg via INTRAVITREAL

## 2021-05-01 NOTE — Assessment & Plan Note (Signed)
Today at 7-week follow-up, improved macular condition, less intraretinal fluid at 7 weeks post Eylea.  We will repeat injection today and examination again in 7 to 8 weeks

## 2021-05-01 NOTE — Assessment & Plan Note (Signed)
OS, vastly improved on recent increase of eplerenone to 50 mg, will continue to observe OS

## 2021-05-01 NOTE — Progress Notes (Signed)
05/01/2021     CHIEF COMPLAINT Patient presents for  Chief Complaint  Patient presents with   Retina Follow Up      HISTORY OF PRESENT ILLNESS: Jordan Hanna is a 60 y.o. male who presents to the clinic today for:   HPI     Retina Follow Up   Patient presents with  Wet AMD.  In right eye.  This started 7 weeks ago.  Severity is mild.  Duration of 7 weeks.  Since onset it is stable.        Comments   7 wk 4 days fu od oct eylea od. Patient states vision is stable and unchanged since last visit. Denies any new floaters or FOL.       Last edited by Laurin Coder on 05/01/2021  2:03 PM.      Referring physician: Dorthula Perfect, MD No address on file  HISTORICAL INFORMATION:   Selected notes from the [facility]: No current outpatient medications on file. (Ophthalmic Drugs)   No current facility-administered medications for this visit. (Ophthalmic Drugs)   Current Outpatient Medications (Other)  Medication Sig   amLODipine (NORVASC) 5 MG tablet Take by mouth.   ELIQUIS 5 MG TABS tablet Take 5 mg by mouth 2 (two) times daily.   eplerenone (INSPRA) 25 MG tablet Take 25 mg by mouth daily.   eplerenone (INSPRA) 50 MG tablet Take 50 mg by mouth daily.   epoetin alfa (EPOGEN) 10000 UNIT/ML injection 10,000 Units every 14 (fourteen) days.   hydroxychloroquine (PLAQUENIL) 200 MG tablet Take by mouth.   losartan (COZAAR) 50 MG tablet Take by mouth.   mirtazapine (REMERON) 30 MG tablet Take 30 mg by mouth at bedtime.   mycophenolate (CELLCEPT) 500 MG tablet    riTUXimab-abbs (TRUXIMA) 500 MG/50ML injection Inject into the vein.   zolpidem (AMBIEN) 5 MG tablet Take 5 mg by mouth at bedtime as needed.   No current facility-administered medications for this visit. (Other)      REVIEW OF SYSTEMS:    ALLERGIES Allergies  Allergen Reactions   Prednisone Swelling    Swelling behind the eye that causes not being  able to see    PAST MEDICAL HISTORY Past Medical History:  Diagnosis Date   Anemia in chronic kidney disease (CKD)    Chronic kidney disease    Hypertension    Past Surgical History:  Procedure Laterality Date   LASIK Bilateral 1999    FAMILY HISTORY Family History  Problem Relation Age of Onset   Diabetes Mother    Hypertension Father    Diabetes Father     SOCIAL HISTORY Social History   Tobacco Use   Smoking status: Never   Smokeless tobacco: Never         OPHTHALMIC EXAM:  Base Eye Exam     Visual Acuity (ETDRS)       Right Left   Dist [place] 20/50 -1 20/30 +2   Dist ph [place] NI          Tonometry (Tonopen, 2:05 PM)       Right Left   Pressure 13 15         Pupils       Pupils Dark Light Shape React APD   Right PERRL 4 3 Round Brisk None   Left PERRL 4 3 Round Brisk None         Extraocular Movement  Right Left    Full Full         Neuro/Psych     Oriented x3: Yes   Mood/Affect: Normal         Dilation     Right eye: 1.0% Mydriacyl, 2.5% Phenylephrine @ 2:05 PM           Slit Lamp and Fundus Exam     External Exam       Right Left   External Normal Normal         Slit Lamp Exam       Right Left   Lids/Lashes 1+ Dermatochalasis - upper lid 1+ Dermatochalasis - upper lid   Conjunctiva/Sclera White and quiet White and quiet   Cornea Clear Clear   Anterior Chamber Deep and quiet Deep and quiet   Iris Round and reactive Round and reactive   Lens 1+ Nuclear sclerosis    Anterior Vitreous Normal          Fundus Exam       Right Left   Posterior Vitreous Posterior vitreous detachment, Central vitreous floaters    Disc Normal    C/D Ratio 0.5    Macula Disciform scar, Retinal pigment epithelial mottling, Retinal pigment epithelial atrophy, no hemorrhage, no exudates, Subretinal fibrosis,    Vessels Normal    Periphery Normal             IMAGING AND PROCEDURES  Imaging and Procedures for  05/01/21  OCT, Retina - OU - Both Eyes       Right Eye Quality was good. Scan locations included subfoveal. Central Foveal Thickness: 328. Progression has improved. Findings include abnormal foveal contour, epiretinal membrane, subretinal hyper-reflective material.   Left Eye Quality was good. Scan locations included subfoveal. Central Foveal Thickness: 247. Progression has improved. Findings include abnormal foveal contour.   Notes OD with much less intraretinal fluid overlying CNVM at 7 weeks post Eylea thus this is a CNVM sensitive to Eylea condition OD will repeat injection today  OS, now with much less subretinal fluid, on higher dose of systemic eplerenone.     Intravitreal Injection, Pharmacologic Agent - OD - Right Eye       Time Out 05/01/2021. 3:11 PM. Confirmed correct patient, procedure, site, and patient consented.   Anesthesia Topical anesthesia was used. Anesthetic medications included Akten 3.5%.   Procedure Preparation included Tobramycin 0.3%, Ofloxacin , 5% betadine to ocular surface, 10% betadine to eyelids. A 30 gauge needle was used.   Injection: 2 mg aflibercept 2 MG/0.05ML   Route: Intravitreal, Site: Right Eye   NDC: [id number], Lot: [id number], Waste: 0 mL   Post-op Post injection exam found visual acuity of at least counting fingers. The patient tolerated the procedure well. There were no complications. The patient received written and verbal post procedure care education. Post injection medications were not given.              ASSESSMENT/PLAN:  Central serous retinopathy, left OS, vastly improved on recent increase of eplerenone to 50 mg, will continue to observe OS  Exudative age-related macular degeneration of right eye with active choroidal neovascularization (HCC) Today at 7-week follow-up, improved macular condition, less intraretinal fluid at 7 weeks post Eylea.  We will repeat injection today and examination again in 7 to 8 weeks      ICD-10-CM   1. Exudative age-related macular degeneration of right eye with active choroidal neovascularization (HCC)  H35.3211 OCT, Retina - OU - Both Eyes  Intravitreal Injection, Pharmacologic Agent - OD - Right Eye    aflibercept (EYLEA) SOLN 2 mg    2. Central serous retinopathy, left  H35.712       1.  OD, stabilized and improved macular findings today 7 weeks and 4 days post most recent injection Eylea.  Repeat injection Eylea OD today and examination again 7-8 weeks  2.  OS, improved subretinal fluid component of central serous retinopathy disease as patient did increase eplerenone.  3.  Ophthalmic Meds Ordered this visit:  Meds ordered this encounter  Medications   aflibercept (EYLEA) SOLN 2 mg       Return in about 7 weeks (around 06/19/2021) for dilate, OD, EYLEA OCT.  There are no Patient Instructions on file for this visit.   Explained the diagnoses, plan, and follow up with the patient and they expressed understanding.  Patient expressed understanding of the importance of proper follow up care.   Clent Demark Raneisha Bress M.D. Diseases & Surgery of the Retina and Vitreous Retina & Diabetic [place] 05/01/21     Abbreviations: M myopia (nearsighted); A astigmatism; H hyperopia (farsighted); P presbyopia; Mrx spectacle prescription;  CTL contact lenses; OD right eye; OS left eye; OU both eyes  XT exotropia; ET esotropia; PEK punctate epithelial keratitis; PEE punctate epithelial erosions; DES dry eye syndrome; MGD meibomian gland dysfunction; ATs artificial tears; PFAT's preservative free artificial tears; [place] nuclear sclerotic cataract; PSC posterior subcapsular cataract; ERM epi-retinal membrane; PVD posterior vitreous detachment; RD retinal detachment; DM diabetes mellitus; DR diabetic retinopathy; NPDR non-proliferative diabetic retinopathy; PDR proliferative diabetic retinopathy; CSME clinically significant macular edema; DME diabetic macular edema; dbh dot blot  hemorrhages; CWS cotton wool spot; POAG primary open angle glaucoma; C/D cup-to-disc ratio; HVF humphrey visual field; GVF goldmann visual field; OCT optical coherence tomography; IOP intraocular pressure; BRVO Branch retinal vein occlusion; CRVO central retinal vein occlusion; CRAO central retinal artery occlusion; BRAO branch retinal artery occlusion; RT retinal tear; SB scleral buckle; PPV pars plana vitrectomy; VH Vitreous hemorrhage; PRP panretinal laser photocoagulation; IVK intravitreal kenalog; VMT vitreomacular traction; MH Macular hole;  NVD neovascularization of the disc; NVE neovascularization elsewhere; AREDS age related eye disease study; ARMD age related macular degeneration; POAG primary open angle glaucoma; EBMD epithelial/anterior basement membrane dystrophy; ACIOL anterior chamber intraocular lens; IOL intraocular lens; PCIOL posterior chamber intraocular lens; Phaco/IOL phacoemulsification with intraocular lens placement; [place] photorefractive keratectomy; LASIK laser assisted in situ keratomileusis; HTN hypertension; DM diabetes mellitus; COPD chronic obstructive pulmonary disease

## 2021-05-12 ENCOUNTER — Other Ambulatory Visit: Payer: Self-pay | Admitting: Urology

## 2021-05-12 DIAGNOSIS — R972 Elevated prostate specific antigen [PSA]: Secondary | ICD-10-CM

## 2021-05-28 ENCOUNTER — Other Ambulatory Visit: Payer: Self-pay | Admitting: Urology

## 2021-05-28 ENCOUNTER — Ambulatory Visit
Admission: RE | Admit: 2021-05-28 | Discharge: 2021-05-28 | Disposition: A | Discharge status: Home / Self Care | Payer: Commercial Managed Care - PPO | Source: Ambulatory Visit | Attending: Urology | Admitting: Urology

## 2021-05-28 ENCOUNTER — Other Ambulatory Visit: Payer: Self-pay

## 2021-05-28 DIAGNOSIS — R972 Elevated prostate specific antigen [PSA]: Secondary | ICD-10-CM

## 2021-05-31 ENCOUNTER — Encounter (HOSPITAL_COMMUNITY): Payer: Self-pay

## 2021-05-31 ENCOUNTER — Ambulatory Visit (HOSPITAL_COMMUNITY)
Admission: EM | Admit: 2021-05-31 | Discharge: 2021-05-31 | Disposition: A | Discharge status: Home / Self Care | Payer: Commercial Managed Care - PPO | Attending: Medical Oncology | Admitting: Medical Oncology

## 2021-05-31 ENCOUNTER — Other Ambulatory Visit: Payer: Self-pay

## 2021-05-31 DIAGNOSIS — Z9489 Other transplanted organ and tissue status: Secondary | ICD-10-CM

## 2021-05-31 DIAGNOSIS — Z889 Allergy status to unspecified drugs, medicaments and biological substances status: Secondary | ICD-10-CM | POA: Diagnosis not present

## 2021-05-31 DIAGNOSIS — J069 Acute upper respiratory infection, unspecified: Secondary | ICD-10-CM | POA: Diagnosis not present

## 2021-05-31 MED ORDER — BENZONATATE 100 MG PO CAPS: 100.0000 mg | ORAL_CAPSULE | Freq: Three times a day (TID) | ORAL | 0 refills | Status: DC | PRN

## 2021-05-31 MED ORDER — ALBUTEROL SULFATE HFA 108 (90 BASE) MCG/ACT IN AERS: 1.0000 | INHALATION_SPRAY | Freq: Four times a day (QID) | RESPIRATORY_TRACT | 0 refills | Status: DC | PRN

## 2021-05-31 NOTE — ED Triage Notes (Signed)
Pt presents with eye drainage and cough since Friday.

## 2021-05-31 NOTE — ED Provider Notes (Signed)
[facility]    CSN: [id number] Arrival date & time: 05/31/21  1604      History   Chief Complaint Chief Complaint  Patient presents with   Cough    HPI Jordan Hanna is a 60 y.o. male.   HPI  Cough: Patient reports that he has had a dry cough for the past 5 days.  He states that he is now having coughing fits and that he is having cough worse at night.  Potential we use but is unsure and he has no history of asthma.  He does have a history of chronic kidney disease and he is a transplant recipient.  He also has a history of drug allergy to prednisone.  Currently he has tried multiple over-the-counter cough and cold medications without any relief of symptoms.  He denies any fever, SOB, vomiting, chest pain. No known sick contacts.   Past Medical History:  Diagnosis Date   Anemia in chronic kidney disease (CKD)    Chronic kidney disease    Hypertension     Patient Active Problem List   Diagnosis Date Noted   Central serous retinopathy, left 07/14/2020   Anemia in chronic kidney disease (CKD) 06/23/2020   Exudative age-related macular degeneration of right eye with active choroidal neovascularization ([place]) 01/11/2020   Posterior vitreous detachment of right eye 10/26/2019   Nuclear sclerotic cataract of right eye 10/26/2019   Cystoid macular edema of right eye 10/26/2019    Past Surgical History:  Procedure Laterality Date   LASIK Bilateral 1999       Home Medications    Prior to Admission medications   Medication Sig Start Date End Date Taking? Authorizing Provider  amLODipine (NORVASC) 5 MG tablet Take by mouth.    [provider]  ELIQUIS 5 MG TABS tablet Take 5 mg by mouth 2 (two) times daily. 01/04/21   [provider]  eplerenone (INSPRA) 25 MG tablet Take 25 mg by mouth daily. 09/17/19   [provider]  eplerenone (INSPRA) 50 MG tablet Take 50 mg by mouth daily. 09/06/20   [provider]  epoetin alfa (EPOGEN)  10000 UNIT/ML injection 10,000 Units every 14 (fourteen) days.    [provider]  hydroxychloroquine (PLAQUENIL) 200 MG tablet Take by mouth. 06/21/16   [provider]  losartan (COZAAR) 50 MG tablet Take by mouth.    [provider]  mirtazapine (REMERON) 30 MG tablet Take 30 mg by mouth at bedtime. 08/04/19   [provider]  mycophenolate (CELLCEPT) 500 MG tablet  10/12/19   [provider]  riTUXimab-abbs ([place]) 500 MG/50ML injection Inject into the vein.    [provider]  zolpidem (AMBIEN) 5 MG tablet Take 5 mg by mouth at bedtime as needed. 10/19/19   [provider]    Family History Family History  Problem Relation Age of Onset   Diabetes Mother    Hypertension Father    Diabetes Father     Social History Social History   Tobacco Use   Smoking status: Never   Smokeless tobacco: Never     Allergies   Prednisone   Review of Systems Review of Systems  As stated above in HPI Physical Exam Triage Vital Signs ED Triage Vitals  Enc Vitals Group     BP 05/31/21 1720 (!) 148/97     Pulse Rate 05/31/21 1720 85     Resp 05/31/21 1720 19     Temp 05/31/21 1720 98.7 F (  37.1 C)     Temp src --      SpO2 05/31/21 1720 96 %     Weight --      Height --      Head Circumference --      Peak Flow --      Pain Score 05/31/21 1719 6     Pain Loc --      Pain Edu? --      Excl. in [place]? --    No data found.  Updated Vital Signs BP (!) 148/97   Pulse 85   Temp 98.7 F (37.1 C)   Resp 19   SpO2 96%   Physical Exam Vitals and nursing note reviewed.  Constitutional:      General: He is not in acute distress.    Appearance: Normal appearance. He is not ill-appearing, toxic-appearing or diaphoretic.  HENT:     Head: Normocephalic and atraumatic.     Right Ear: Tympanic membrane normal.     Left Ear: Tympanic membrane normal.     Nose: Congestion present. No rhinorrhea.     Mouth/Throat:     Mouth:  Mucous membranes are moist.     Pharynx: Oropharynx is clear. No oropharyngeal exudate or posterior oropharyngeal erythema.  Eyes:     Extraocular Movements: Extraocular movements intact.     Pupils: Pupils are equal, round, and reactive to light.  Cardiovascular:     Rate and Rhythm: Normal rate and regular rhythm.     Heart sounds: Normal heart sounds.  Pulmonary:     Effort: Pulmonary effort is normal.     Breath sounds: Normal breath sounds.  Musculoskeletal:     Cervical back: Normal range of motion and neck supple.  Lymphadenopathy:     Cervical: No cervical adenopathy.  Skin:    General: Skin is warm.  Neurological:     Mental Status: He is alert and oriented to person, place, and time.     UC Treatments / Results  Labs (all labs ordered are listed, but only abnormal results are displayed) Labs Reviewed - No data to display  EKG   Radiology No results found.  Procedures Procedures (including critical care time)  Medications Ordered in UC Medications - No data to display  Initial Impression / Assessment and Plan / UC Course  I have reviewed the triage vital signs and the nursing notes.  Pertinent labs & imaging results that were available during my care of the patient were reviewed by me and considered in my medical decision making (see chart for details).     New.  Complicated course.  Treating with Tessalon, albuterol given his drug allergies and conditions.  Discussed red flag signs symptoms.  Rest and hydration with water encouraged. Final Clinical Impressions(s) / UC Diagnoses   Final diagnoses:  None   Discharge Instructions   None    ED Prescriptions   None    PDMP not reviewed this encounter.   Hughie Closs, Hershal [place] 05/31/21 1758

## 2021-06-05 ENCOUNTER — Encounter (HOSPITAL_COMMUNITY): Payer: Self-pay

## 2021-06-06 ENCOUNTER — Other Ambulatory Visit: Payer: Self-pay

## 2021-06-06 ENCOUNTER — Emergency Department (HOSPITAL_BASED_OUTPATIENT_CLINIC_OR_DEPARTMENT_OTHER)
Admission: EM | Admit: 2021-06-06 | Discharge: 2021-06-06 | Disposition: A | Discharge status: Home / Self Care | Payer: Commercial Managed Care - PPO | Attending: Emergency Medicine | Admitting: Emergency Medicine

## 2021-06-06 ENCOUNTER — Encounter (HOSPITAL_BASED_OUTPATIENT_CLINIC_OR_DEPARTMENT_OTHER): Payer: Self-pay | Admitting: Emergency Medicine

## 2021-06-06 ENCOUNTER — Emergency Department (HOSPITAL_BASED_OUTPATIENT_CLINIC_OR_DEPARTMENT_OTHER): Payer: Commercial Managed Care - PPO

## 2021-06-06 DIAGNOSIS — J22 Unspecified acute lower respiratory infection: Secondary | ICD-10-CM

## 2021-06-06 DIAGNOSIS — I129 Hypertensive chronic kidney disease with stage 1 through stage 4 chronic kidney disease, or unspecified chronic kidney disease: Secondary | ICD-10-CM | POA: Diagnosis not present

## 2021-06-06 DIAGNOSIS — R0789 Other chest pain: Secondary | ICD-10-CM | POA: Diagnosis not present

## 2021-06-06 DIAGNOSIS — Z20822 Contact with and (suspected) exposure to covid-19: Secondary | ICD-10-CM | POA: Insufficient documentation

## 2021-06-06 DIAGNOSIS — Z79899 Other long term (current) drug therapy: Secondary | ICD-10-CM | POA: Diagnosis not present

## 2021-06-06 DIAGNOSIS — D631 Anemia in chronic kidney disease: Secondary | ICD-10-CM | POA: Diagnosis not present

## 2021-06-06 DIAGNOSIS — R509 Fever, unspecified: Secondary | ICD-10-CM | POA: Diagnosis not present

## 2021-06-06 DIAGNOSIS — Z7901 Long term (current) use of anticoagulants: Secondary | ICD-10-CM | POA: Diagnosis not present

## 2021-06-06 DIAGNOSIS — R059 Cough, unspecified: Secondary | ICD-10-CM | POA: Diagnosis not present

## 2021-06-06 DIAGNOSIS — N189 Chronic kidney disease, unspecified: Secondary | ICD-10-CM | POA: Diagnosis not present

## 2021-06-06 DIAGNOSIS — R0602 Shortness of breath: Secondary | ICD-10-CM | POA: Diagnosis present

## 2021-06-06 LAB — CBC WITH DIFFERENTIAL/PLATELET
Abs Immature Granulocytes: 0.04 10*3/uL (ref 0.00–0.07)
Basophils Absolute: 0.1 10*3/uL (ref 0.0–0.1)
Basophils Relative: 1 %
Eosinophils Absolute: 0.2 10*3/uL (ref 0.0–0.5)
Eosinophils Relative: 2 %
HCT: 31.1 % — ABNORMAL LOW (ref 39.0–52.0)
Hemoglobin: 10 g/dL — ABNORMAL LOW (ref 13.0–17.0)
Immature Granulocytes: 1 %
Lymphocytes Relative: 5 %
Lymphs Abs: 0.3 10*3/uL — ABNORMAL LOW (ref 0.7–4.0)
MCH: 28.4 pg (ref 26.0–34.0)
MCHC: 32.2 g/dL (ref 30.0–36.0)
MCV: 88.4 fL (ref 80.0–100.0)
Monocytes Absolute: 1 10*3/uL (ref 0.1–1.0)
Monocytes Relative: 14 %
Neutro Abs: 5.2 10*3/uL (ref 1.7–7.7)
Neutrophils Relative %: 77 %
Platelets: 201 10*3/uL (ref 150–400)
RBC: 3.52 MIL/uL — ABNORMAL LOW (ref 4.22–5.81)
RDW: 16.4 % — ABNORMAL HIGH (ref 11.5–15.5)
WBC: 6.7 10*3/uL (ref 4.0–10.5)
nRBC: 0 % (ref 0.0–0.2)

## 2021-06-06 LAB — BASIC METABOLIC PANEL
Anion gap: 8 (ref 5–15)
BUN: 36 mg/dL — ABNORMAL HIGH (ref 8–23)
CO2: 19 mmol/L — ABNORMAL LOW (ref 22–32)
Calcium: 9.7 mg/dL (ref 8.9–10.3)
Chloride: 103 mmol/L (ref 98–111)
Creatinine, Ser: 3.09 mg/dL — ABNORMAL HIGH (ref 0.61–1.24)
GFR, Estimated: 22 mL/min — ABNORMAL LOW (ref >60)
Glucose, Bld: 109 mg/dL — ABNORMAL HIGH (ref 70–99)
Potassium: 5 mmol/L (ref 3.5–5.1)
Sodium: 130 mmol/L — ABNORMAL LOW (ref 135–145)

## 2021-06-06 LAB — RESP PANEL BY RT-PCR (FLU A&B, COVID) ARPGX2
Influenza A by PCR: NEGATIVE
Influenza B by PCR: NEGATIVE
SARS Coronavirus 2 by RT PCR: NEGATIVE

## 2021-06-06 MED ORDER — DOXYCYCLINE HYCLATE 100 MG PO CAPS: 100.0000 mg | ORAL_CAPSULE | Freq: Two times a day (BID) | ORAL | 0 refills | Status: DC

## 2021-06-06 MED ORDER — HYDROCOD POLST-CPM POLST ER 10-8 MG/5ML PO SUER: 5.0000 mL | Freq: Two times a day (BID) | ORAL | 0 refills | Status: DC | PRN

## 2021-06-06 NOTE — ED Provider Notes (Signed)
[facility] Provider Note  CSN: [id number] Arrival date & time: 06/06/21 0945    History Chief Complaint  Patient presents with  . Shortness of Breath    Jordan Hanna is a 60 y.o. male with history of lupus s/p kidney transplant in Mar 2022 reports about a week of fever, productive cough and SOB during a coughing spell. No SOB when not coughing. He had some L anterior chest wall pain a few days ago that has resolved. He was seen at [facility] and given Rx for Tessalon and albuterol inhaler which have not helped. He does not tolerate steroids well.    Past Medical History:  Diagnosis Date  . Anemia in chronic kidney disease (CKD)   . Chronic kidney disease   . Hypertension     Past Surgical History:  Procedure Laterality Date  . LASIK Bilateral 1999    Family History  Problem Relation Age of Onset  . Diabetes Mother   . Hypertension Father   . Diabetes Father     Social History   Tobacco Use  . Smoking status: Never  . Smokeless tobacco: Never     Home Medications Prior to Admission medications   Medication Sig Start Date End Date Taking? Authorizing Provider  albuterol (VENTOLIN HFA) 108 (90 Base) MCG/ACT inhaler Inhale 1-2 puffs into the lungs every 6 (six) hours as needed for shortness of breath. 05/31/21   Hughie Closs, PA-C  amLODipine (NORVASC) 5 MG tablet Take by mouth.    [provider]  benzonatate (TESSALON PERLES) 100 MG capsule Take 1 capsule (100 mg total) by mouth 3 (three) times daily as needed for cough. 05/31/21   Covington, Holli Humbles, PA-C  ELIQUIS 5 MG TABS tablet Take 5 mg by mouth 2 (two) times daily. 01/04/21   [provider]  eplerenone (INSPRA) 25 MG tablet Take 25 mg by mouth daily. 09/17/19   [provider]  eplerenone (INSPRA) 50 MG tablet Take 50 mg by mouth daily. 09/06/20   [provider]  epoetin alfa (EPOGEN) 10000 UNIT/ML injection 10,000 Units every 14 (fourteen)  days.    [provider]  hydroxychloroquine (PLAQUENIL) 200 MG tablet Take by mouth. 06/21/16   [provider]  losartan (COZAAR) 50 MG tablet Take by mouth.    [provider]  mirtazapine (REMERON) 30 MG tablet Take 30 mg by mouth at bedtime. 08/04/19   [provider]  mycophenolate (CELLCEPT) 500 MG tablet  10/12/19   [provider]  riTUXimab-abbs ([place]) 500 MG/50ML injection Inject into the vein.    [provider]  zolpidem (AMBIEN) 5 MG tablet Take 5 mg by mouth at bedtime as needed. 10/19/19   [provider]     Allergies    Prednisone   Review of Systems   Review of Systems A comprehensive review of systems was completed and negative except as noted in HPI.    Physical Exam BP (!) 124/100 (BP Location: Right Arm)   Pulse (!) 118   Temp 99.2 F (37.3 C)   Resp 16   Ht 5\' 9"  (1.753 m)   Wt 71.7 kg   SpO2 100%   BMI 23.33 kg/m   Physical Exam Vitals and nursing note reviewed.  Constitutional:      Appearance: Normal appearance.  HENT:     Head: Normocephalic and atraumatic.     Nose: Nose normal.     Mouth/Throat:     Mouth: Mucous membranes  are moist.  Eyes:     Extraocular Movements: Extraocular movements intact.     Conjunctiva/sclera: Conjunctivae normal.  Cardiovascular:     Rate and Rhythm: Normal rate.  Pulmonary:     Effort: Pulmonary effort is normal.     Breath sounds: Normal breath sounds. No wheezing or rhonchi.  Abdominal:     General: Abdomen is flat.     Palpations: Abdomen is soft.     Tenderness: There is no abdominal tenderness.     Comments: Transplanted kidney in RLQ without tenderness  Musculoskeletal:        General: No swelling. Normal range of motion.     Cervical back: Neck supple.  Skin:    General: Skin is warm and dry.  Neurological:     General: No focal deficit present.     Mental Status: He is alert.  Psychiatric:        Mood and Affect: Mood normal.      ED Results / Procedures / Treatments   Labs (all labs ordered are listed, but only abnormal results are displayed) Labs Reviewed  RESP PANEL BY RT-PCR (FLU A&B, COVID) [id number]  BASIC METABOLIC PANEL  CBC WITH DIFFERENTIAL/PLATELET    EKG EKG Interpretation  Date/Time:  Tuesday June 06 2021 10:02:17 EST Ventricular Rate:  117 PR Interval:  176 QRS Duration: 86 QT Interval:  310 QTC Calculation: 432 R Axis:   264 Text Interpretation: Sinus tachycardia Biatrial enlargement Right superior axis deviation Pulmonary disease pattern Abnormal ECG No old tracing to compare Confirmed by Calvert Cantor [phone]) on 06/06/2021 10:26:41 AM   Radiology DG Chest Portable 1 View  Result Date: 06/06/2021 CLINICAL DATA:  Cough/ SOB EXAM: PORTABLE CHEST 1 VIEW COMPARISON:  None. FINDINGS: The cardiomediastinal silhouette is within normal limits. There is no focal airspace consolidation. There is no pleural effusion or visible pneumothorax. There is no acute osseous abnormality. IMPRESSION: No evidence of acute cardiopulmonary disease. Electronically Signed   By: Maurine Simmering M.D.   On: 06/06/2021 10:51    Procedures Procedures  Medications Ordered in the ED Medications - No data to display   MDM Rules/Calculators/A&P MDM Patient without fever here. CXR is clear. Covid/Flu are neg. Will check labs for kidney function. Patient would probably benefit from steroids but states he cannot take them.   ED Course  I have reviewed the triage vital signs and the nursing notes.  Pertinent labs & imaging results that were available during my care of the patient were reviewed by me and considered in my medical decision making (see chart for details).  Clinical Course as of 06/06/21 1505  Tue Jun 06, 2021  1222 CBC with normal WBC, mild anemia is at baseline.  [CS]  1255 BMP shows CKD at baseline from prior labs available in [place]. Plan discharge with Rx for doxycycline for persistent  cough in immunosuppressed patient. Tussionex for continued cough. PCP follow up or return for worsening.  [CS]    Clinical Course User Index [CS] Truddie Hidden, MD    Final Clinical Impression(s) / ED Diagnoses Final diagnoses:  None    Rx / DC Orders ED Discharge Orders     None        Truddie Hidden, MD 06/06/21 1505

## 2021-06-06 NOTE — ED Triage Notes (Addendum)
Pt arrives to ED with dry cough, sinus pressure, sore throat, fevers, and shortness of breath x1 week. He was prescribed Benzonatate and Albuterol from UC that has not gave him relief. Pt reports he has had unintentional weight loss recently. He is a kidney transplant recipient.

## 2021-06-06 NOTE — ED Notes (Signed)
This RN presented the AVS utilizing Teachback Method. Patient verbalizes understanding of Discharge Instructions. Opportunity for Questioning and Answers were provided. Patient Discharged from ED ambulatory to Home via Self.

## 2021-06-19 ENCOUNTER — Ambulatory Visit (INDEPENDENT_AMBULATORY_CARE_PROVIDER_SITE_OTHER): Payer: Commercial Managed Care - PPO | Admitting: Ophthalmology

## 2021-06-19 ENCOUNTER — Encounter (INDEPENDENT_AMBULATORY_CARE_PROVIDER_SITE_OTHER): Payer: Self-pay | Admitting: Ophthalmology

## 2021-06-19 ENCOUNTER — Other Ambulatory Visit: Payer: Self-pay

## 2021-06-19 DIAGNOSIS — H35351 Cystoid macular degeneration, right eye: Secondary | ICD-10-CM | POA: Diagnosis not present

## 2021-06-19 DIAGNOSIS — H353211 Exudative age-related macular degeneration, right eye, with active choroidal neovascularization: Secondary | ICD-10-CM | POA: Diagnosis not present

## 2021-06-19 MED ORDER — AFLIBERCEPT 2MG/0.05ML IZ SOLN FOR KALEIDOSCOPE
2.0000 mg | INTRAVITREAL | Status: AC | PRN
Start: 2021-06-19 — End: 2021-06-19
  Administered 2021-06-19: 16:00:00 2 mg via INTRAVITREAL

## 2021-06-19 NOTE — Progress Notes (Signed)
06/19/2021     CHIEF COMPLAINT Patient presents for  Chief Complaint  Patient presents with   Retina Follow Up      HISTORY OF PRESENT ILLNESS: Jordan Hanna is a 60 y.o. male who presents to the clinic today for:   HPI     Retina Follow Up   Patient presents with  Wet AMD.  In right eye.  This started 7 weeks ago.  Duration of 7 weeks.        Comments   7 week f/u OD with OCT and possible Eylea injection  Pt c/o noticing the small print on the TV is not as clear for him as it was before, noticed ~ 1.5 weeks ago.      Last edited by Reather Littler, COA on 06/19/2021  3:45 PM.      Referring physician: Curt Jews, OD [id number],  [place] [phone]  HISTORICAL INFORMATION:   Selected notes from the [facility]: No current outpatient medications on file. (Ophthalmic Drugs)   No current facility-administered medications for this visit. (Ophthalmic Drugs)   Current Outpatient Medications (Other)  Medication Sig   albuterol (VENTOLIN HFA) 108 (90 Base) MCG/ACT inhaler Inhale 1-2 puffs into the lungs every 6 (six) hours as needed for shortness of breath.   Belatacept (NULOJIX IV) Inject into the vein. Takes the 12th of every month-infusion   benzonatate (TESSALON PERLES) 100 MG capsule Take 1 capsule (100 mg total) by mouth 3 (three) times daily as needed for cough.   chlorpheniramine-HYDROcodone (TUSSIONEX PENNKINETIC ER) 10-8 MG/5ML SUER Take 5 mLs by mouth every 12 (twelve) hours as needed for cough.   doxycycline (VIBRAMYCIN) 100 MG capsule Take 1 capsule (100 mg total) by mouth 2 (two) times daily.   ELIQUIS 5 MG TABS tablet Take 2.5 mg by mouth 2 (two) times daily.   eplerenone (INSPRA) 50 MG tablet Take 50 mg by mouth daily.   hydroxychloroquine (PLAQUENIL) 200 MG tablet Take 200 mg by mouth 2 (two) times daily.   lisinopril (ZESTRIL) 5 MG tablet Take 5 mg by mouth daily.   mirtazapine (REMERON) 30  MG tablet Take 30 mg by mouth at bedtime.   sirolimus (RAPAMUNE) 1 MG tablet Take 2 mg by mouth See admin instructions. 2 tablets every other day 1 tablet on the odd days   sulfamethoxazole-trimethoprim (BACTRIM DS) 800-160 MG tablet Take 1 tablet by mouth 3 (three) times a week.   zolpidem (AMBIEN) 5 MG tablet Take 5 mg by mouth at bedtime as needed.   No current facility-administered medications for this visit. (Other)      REVIEW OF SYSTEMS:    ALLERGIES Allergies  Allergen Reactions   Prednisone Swelling    Swelling behind the eye that causes not being able to see    PAST MEDICAL HISTORY Past Medical History:  Diagnosis Date   Anemia in chronic kidney disease (CKD)    Chronic kidney disease    Hypertension    Past Surgical History:  Procedure Laterality Date   LASIK Bilateral 1999    FAMILY HISTORY Family History  Problem Relation Age of Onset   Diabetes Mother    Hypertension Father    Diabetes Father     SOCIAL HISTORY Social History   Tobacco Use   Smoking status: Never   Smokeless tobacco: Never         OPHTHALMIC EXAM:  Base Eye Exam  Visual Acuity (ETDRS)       Right Left   Dist cc 20/40 -2 20/20   Dist ph cc NI     Correction: Glasses         Tonometry (Tonopen, 3:56 PM)       Right Left   Pressure 12 10         Pupils       Pupils Dark Light Shape React APD   Right PERRL 5 3 Round Brisk None   Left PERRL 5 3 Round Brisk None         Visual Fields (Counting fingers)       Left Right    Full Full         Extraocular Movement       Right Left    Full, Ortho Full, Ortho         Neuro/Psych     Oriented x3: Yes   Mood/Affect: Normal         Dilation     Right eye: 1.0% Mydriacyl, 2.5% Phenylephrine @ 3:54 PM           Slit Lamp and Fundus Exam     External Exam       Right Left   External Normal Normal         Slit Lamp Exam       Right Left   Lids/Lashes 1+ Dermatochalasis -  upper lid 1+ Dermatochalasis - upper lid   Conjunctiva/Sclera White and quiet White and quiet   Cornea Clear Clear   Anterior Chamber Deep and quiet Deep and quiet   Iris Round and reactive Round and reactive   Lens 1+ Nuclear sclerosis 1+ Nuclear sclerosis   Anterior Vitreous Normal Normal         Fundus Exam       Right Left   Posterior Vitreous Posterior vitreous detachment, Central vitreous floaters    Disc Normal    C/D Ratio 0.5    Macula Disciform scar, Retinal pigment epithelial mottling, Retinal pigment epithelial atrophy, no hemorrhage, no exudates, Subretinal fibrosis    Vessels Normal    Periphery Normal             IMAGING AND PROCEDURES  Imaging and Procedures for 06/19/21  OCT, Retina - OU - Both Eyes       Right Eye Quality was good. Scan locations included subfoveal. Central Foveal Thickness: 328. Progression has improved. Findings include abnormal foveal contour, epiretinal membrane, subretinal hyper-reflective material.   Left Eye Quality was good. Scan locations included subfoveal. Central Foveal Thickness: 253. Progression has improved. Findings include abnormal foveal contour.   Notes OD with much less intraretinal fluid overlying CNVM at 7 weeks post Eylea thus this is a CNVM sensitive to Eylea condition OD will repeat injection today  OS, now with much less subretinal fluid, on higher dose of systemic eplerenone.     Intravitreal Injection, Pharmacologic Agent - OD - Right Eye       Time Out 06/19/2021. 4:21 PM. Confirmed correct patient, procedure, site, and patient consented.   Anesthesia Topical anesthesia was used. Anesthetic medications included Lidocaine 4%.   Procedure Preparation included Tobramycin 0.3%, Ofloxacin , 5% betadine to ocular surface, 10% betadine to eyelids. A 30 gauge needle was used.   Injection: 2 mg aflibercept 2 MG/0.05ML   Route: Intravitreal, Site: Right Eye   NDC: [id number], Lot: [id number], Waste: 0  mL   Post-op Post injection exam found visual acuity  of at least counting fingers. The patient tolerated the procedure well. There were no complications. The patient received written and verbal post procedure care education. Post injection medications included ocuflox.              ASSESSMENT/PLAN:  Cystoid macular edema of right eye Component of wet armd, improved today   Exudative age-related macular degeneration of right eye with active choroidal neovascularization (HCC) Vastly improved OD, currently at 7-week follow-up interval with subfoveal fibrosis and much less intraretinal fluid we will repeat intravitreal Eylea OD today     ICD-10-CM   1. Exudative age-related macular degeneration of right eye with active choroidal neovascularization (HCC)  H35.3211 OCT, Retina - OU - Both Eyes    Intravitreal Injection, Pharmacologic Agent - OD - Right Eye    aflibercept (EYLEA) SOLN 2 mg    2. Cystoid macular edema of right eye  H35.351       1.  OD, vastly improved at 7-week interval today, with chronic active subfoveal CNVM with less intraretinal fluid today at 7-week interval post Eylea.  We will repeat injection today  2.  OS also with less subretinal fluid diffusely , Thus no sign of active CNVM but we will continue to watch and monitor the intraretinal fluid nasal to FAZ  3.  Ophthalmic Meds Ordered this visit:  Meds ordered this encounter  Medications   aflibercept (EYLEA) SOLN 2 mg       Return in about 7 weeks (around 08/07/2021) for DILATE OU, EYLEA OCT, OD.  There are no Patient Instructions on file for this visit.   Explained the diagnoses, plan, and follow up with the patient and they expressed understanding.  Patient expressed understanding of the importance of proper follow up care.   Clent Demark Talyn Dessert M.D. Diseases & Surgery of the Retina and Vitreous [facility]ina [facility] Diabetic [place] 06/19/21     Abbreviations: M myopia (nearsighted); A astigmatism; H  hyperopia (farsighted); P presbyopia; Mrx spectacle prescription;  CTL contact lenses; OD right eye; OS left eye; OU both eyes  XT exotropia; ET esotropia; PEK punctate epithelial keratitis; PEE punctate epithelial erosions; DES dry eye syndrome; MGD meibomian gland dysfunction; ATs artificial tears; PFAT's preservative free artificial tears; [place] nuclear sclerotic cataract; PSC posterior subcapsular cataract; ERM epi-retinal membrane; PVD posterior vitreous detachment; RD retinal detachment; DM diabetes mellitus; DR diabetic retinopathy; NPDR non-proliferative diabetic retinopathy; PDR proliferative diabetic retinopathy; CSME clinically significant macular edema; DME diabetic macular edema; dbh dot blot hemorrhages; CWS cotton wool spot; POAG primary open angle glaucoma; C/D cup-to-disc ratio; HVF humphrey visual field; GVF goldmann visual field; OCT optical coherence tomography; IOP intraocular pressure; BRVO Branch retinal vein occlusion; CRVO central retinal vein occlusion; CRAO central retinal artery occlusion; BRAO branch retinal artery occlusion; RT retinal tear; SB scleral buckle; PPV pars plana vitrectomy; VH Vitreous hemorrhage; PRP panretinal laser photocoagulation; IVK intravitreal kenalog; VMT vitreomacular traction; MH Macular hole;  NVD neovascularization of the disc; NVE neovascularization elsewhere; AREDS age related eye disease study; ARMD age related macular degeneration; POAG primary open angle glaucoma; EBMD epithelial/anterior basement membrane dystrophy; ACIOL anterior chamber intraocular lens; IOL intraocular lens; PCIOL posterior chamber intraocular lens; Phaco/IOL phacoemulsification with intraocular lens placement; [place] photorefractive keratectomy; LASIK laser assisted in situ keratomileusis; HTN hypertension; DM diabetes mellitus; COPD chronic obstructive pulmonary disease

## 2021-06-19 NOTE — Assessment & Plan Note (Signed)
Component of wet armd, improved today

## 2021-06-19 NOTE — Assessment & Plan Note (Signed)
Vastly improved OD, currently at 7-week follow-up interval with subfoveal fibrosis and much less intraretinal fluid we will repeat intravitreal Eylea OD today

## 2021-08-02 ENCOUNTER — Other Ambulatory Visit: Payer: Self-pay

## 2021-08-02 ENCOUNTER — Encounter (INDEPENDENT_AMBULATORY_CARE_PROVIDER_SITE_OTHER): Payer: Self-pay | Admitting: Ophthalmology

## 2021-08-02 ENCOUNTER — Encounter (INDEPENDENT_AMBULATORY_CARE_PROVIDER_SITE_OTHER): Payer: Commercial Managed Care - PPO | Admitting: Ophthalmology

## 2021-08-02 ENCOUNTER — Ambulatory Visit (INDEPENDENT_AMBULATORY_CARE_PROVIDER_SITE_OTHER): Payer: Commercial Managed Care - PPO | Admitting: Ophthalmology

## 2021-08-02 DIAGNOSIS — H35712 Central serous chorioretinopathy, left eye: Secondary | ICD-10-CM

## 2021-08-02 DIAGNOSIS — M3210 Systemic lupus erythematosus, organ or system involvement unspecified: Secondary | ICD-10-CM | POA: Diagnosis not present

## 2021-08-02 DIAGNOSIS — H35351 Cystoid macular degeneration, right eye: Secondary | ICD-10-CM

## 2021-08-02 DIAGNOSIS — H353211 Exudative age-related macular degeneration, right eye, with active choroidal neovascularization: Secondary | ICD-10-CM

## 2021-08-02 MED ORDER — AFLIBERCEPT 2MG/0.05ML IZ SOLN FOR KALEIDOSCOPE
2.0000 mg | INTRAVITREAL | Status: AC | PRN
  Administered 2021-08-02: 2 mg via INTRAVITREAL

## 2021-08-02 NOTE — Assessment & Plan Note (Signed)
History of, not active OS at this time

## 2021-08-02 NOTE — Progress Notes (Signed)
08/02/2021     CHIEF COMPLAINT Patient presents for  Chief Complaint  Patient presents with   Retina Follow Up      HISTORY OF PRESENT ILLNESS: Jordan Hanna is a 61 y.o. male who presents to the clinic today for:   HPI     Retina Follow Up           Diagnosis: Wet AMD   Laterality: right eye   Onset: 6 weeks ago   Severity: mild   Duration: 6 weeks   Course: stable         Comments   6 weels dilate OU, Eylea OCT OD. Patient states vision is stable and unchanged since last visit. Denies any new floaters or FOL. Some difficulty with reading fine print OD      Last edited by Hurman Horn, MD on 08/02/2021  3:49 PM.      Referring physician: Dorthula Perfect, MD No address on file  HISTORICAL INFORMATION:   Selected notes from the [facility]: No current outpatient medications on file. (Ophthalmic Drugs)   No current facility-administered medications for this visit. (Ophthalmic Drugs)   Current Outpatient Medications (Other)  Medication Sig   albuterol (VENTOLIN HFA) 108 (90 Base) MCG/ACT inhaler Inhale 1-2 puffs into the lungs every 6 (six) hours as needed for shortness of breath.   Belatacept (NULOJIX IV) Inject into the vein. Takes the 12th of every month-infusion   benzonatate (TESSALON PERLES) 100 MG capsule Take 1 capsule (100 mg total) by mouth 3 (three) times daily as needed for cough.   chlorpheniramine-HYDROcodone (TUSSIONEX PENNKINETIC ER) 10-8 MG/5ML SUER Take 5 mLs by mouth every 12 (twelve) hours as needed for cough.   doxycycline (VIBRAMYCIN) 100 MG capsule Take 1 capsule (100 mg total) by mouth 2 (two) times daily.   ELIQUIS 5 MG TABS tablet Take 2.5 mg by mouth 2 (two) times daily.   eplerenone (INSPRA) 50 MG tablet Take 50 mg by mouth daily.   hydroxychloroquine (PLAQUENIL) 200 MG tablet Take 200 mg by mouth 2 (two) times daily.   lisinopril (ZESTRIL) 5 MG tablet Take 5 mg by mouth daily.    mirtazapine (REMERON) 30 MG tablet Take 30 mg by mouth at bedtime.   sirolimus (RAPAMUNE) 1 MG tablet Take 2 mg by mouth See admin instructions. 2 tablets every other day 1 tablet on the odd days   sulfamethoxazole-trimethoprim (BACTRIM DS) 800-160 MG tablet Take 1 tablet by mouth 3 (three) times a week.   zolpidem (AMBIEN) 5 MG tablet Take 5 mg by mouth at bedtime as needed.   No current facility-administered medications for this visit. (Other)      REVIEW OF SYSTEMS:    ALLERGIES Allergies  Allergen Reactions   Prednisone Swelling    Swelling behind the eye that causes not being able to see    PAST MEDICAL HISTORY Past Medical History:  Diagnosis Date   Anemia in chronic kidney disease (CKD)    Chronic kidney disease    Hypertension    Past Surgical History:  Procedure Laterality Date   LASIK Bilateral 1999    FAMILY HISTORY Family History  Problem Relation Age of Onset   Diabetes Mother    Hypertension Father    Diabetes Father     SOCIAL HISTORY Social History   Tobacco Use   Smoking status: Never   Smokeless tobacco: Never  OPHTHALMIC EXAM:  Base Eye Exam     Visual Acuity (ETDRS)       Right Left   Dist cc 20/60 -1 20/20 -1   Dist ph cc 20/50     Correction: Glasses         Tonometry (Tonopen, 3:27 PM)       Right Left   Pressure 11 11         Pupils       Pupils Dark Light APD   Right PERRL 5 3 None   Left PERRL 5 3 None         Extraocular Movement       Right Left    Full Full         Neuro/Psych     Oriented x3: Yes   Mood/Affect: Normal         Dilation     Both eyes: 1.0% Mydriacyl, 2.5% Phenylephrine @ 3:27 PM           Slit Lamp and Fundus Exam     External Exam       Right Left   External Normal Normal         Slit Lamp Exam       Right Left   Lids/Lashes 1+ Dermatochalasis - upper lid 1+ Dermatochalasis - upper lid   Conjunctiva/Sclera White and quiet White and quiet    Cornea Clear Clear   Anterior Chamber Deep and quiet Deep and quiet   Iris Round and reactive Round and reactive   Lens 1+ Nuclear sclerosis 1+ Nuclear sclerosis   Anterior Vitreous Normal Normal         Fundus Exam       Right Left   Posterior Vitreous Posterior vitreous detachment, Central vitreous floaters Normal   Disc Normal Normal   C/D Ratio 0.5 0.4   Macula Disciform scar, Retinal pigment epithelial mottling, Retinal pigment epithelial atrophy, no hemorrhage, no exudates, Subretinal fibrosis No subretinal fluid, no hemorrhage   Vessels Normal Normal   Periphery Normal Normal            IMAGING AND PROCEDURES  Imaging and Procedures for 08/02/21  Intravitreal Injection, Pharmacologic Agent - OD - Right Eye       Time Out 08/02/2021. 3:50 PM. Confirmed correct patient, procedure, site, and patient consented.   Anesthesia Topical anesthesia was used. Anesthetic medications included Lidocaine 4%.   Procedure Preparation included Tobramycin 0.3%, Ofloxacin , 5% betadine to ocular surface, 10% betadine to eyelids. A 30 gauge needle was used.   Injection: 2 mg aflibercept 2 MG/0.05ML   Route: Intravitreal, Site: Right Eye   NDC: [id number], Lot: [id number], Waste: 0 mL   Post-op Post injection exam found visual acuity of at least counting fingers. The patient tolerated the procedure well. There were no complications. The patient received written and verbal post procedure care education. Post injection medications included ocuflox.      OCT, Retina - OU - Both Eyes       Right Eye Quality was good. Scan locations included subfoveal. Central Foveal Thickness: 329. Progression has improved. Findings include abnormal foveal contour, epiretinal membrane, subretinal hyper-reflective material.   Left Eye Quality was good. Scan locations included subfoveal. Central Foveal Thickness: 292. Progression has improved. Findings include abnormal foveal contour.    Notes OD with much less intraretinal fluid overlying CNVM at 6 weeks post Eylea thus this is a CNVM sensitive to Eylea condition OD will repeat injection today  OS, now with much less subretinal fluid, on higher dose of systemic eplerenone.             ASSESSMENT/PLAN:  Central serous retinopathy, left History of, not active OS at this time  Cystoid macular edema of right eye Component of wet AMD improved and stable OD at this time at 6-week follow-up  Exudative age-related macular degeneration of right eye with active choroidal neovascularization ([place]) Vastly improved OD today at 6-week follow-up post Eylea.  Stabilized condition, subfoveal disciform scar, no intraretinal fluid.  Repeat evaluation after injection today and 8-week      ICD-10-CM   1. Exudative age-related macular degeneration of right eye with active choroidal neovascularization (HCC)  H35.3211 Intravitreal Injection, Pharmacologic Agent - OD - Right Eye    OCT, Retina - OU - Both Eyes    aflibercept (EYLEA) SOLN 2 mg    2. Central serous retinopathy, left  H35.712     3. Cystoid macular edema of right eye  H35.351       1.  OD, vastly improved anatomically with residual subfoveal disciform scar subretinal fibrosis.  No intraretinal fluid or subretinal fluid remaining at 6-week interval today.  2.  OS with chronic perifoveal CME otherwise good acuity no change over time will observe  3.  Ophthalmic Meds Ordered this visit:  Meds ordered this encounter  Medications   aflibercept (EYLEA) SOLN 2 mg       Return in about 8 weeks (around 09/27/2021) for dilate, OD, EYLEA OCT.  There are no Patient Instructions on file for this visit.   Explained the diagnoses, plan, and follow up with the patient and they expressed understanding.  Patient expressed understanding of the importance of proper follow up care.   Clent Demark Fredrica Capano M.D. Diseases & Surgery of the Retina and Vitreous [facility] 08/02/21     Abbreviations: M myopia (nearsighted); A astigmatism; H hyperopia (farsighted); P presbyopia; Mrx spectacle prescription;  CTL contact lenses; OD right eye; OS left eye; OU both eyes  XT exotropia; ET esotropia; PEK punctate epithelial keratitis; PEE punctate epithelial erosions; DES dry eye syndrome; MGD meibomian gland dysfunction; ATs artificial tears; PFAT's preservative free artificial tears; [place] nuclear sclerotic cataract; PSC posterior subcapsular cataract; ERM epi-retinal membrane; PVD posterior vitreous detachment; RD retinal detachment; DM diabetes mellitus; DR diabetic retinopathy; NPDR non-proliferative diabetic retinopathy; PDR proliferative diabetic retinopathy; CSME clinically significant macular edema; DME diabetic macular edema; dbh dot blot hemorrhages; CWS cotton wool spot; POAG primary open angle glaucoma; C/D cup-to-disc ratio; HVF humphrey visual field; GVF goldmann visual field; OCT optical coherence tomography; IOP intraocular pressure; BRVO Branch retinal vein occlusion; CRVO central retinal vein occlusion; CRAO central retinal artery occlusion; BRAO branch retinal artery occlusion; RT retinal tear; SB scleral buckle; PPV pars plana vitrectomy; VH Vitreous hemorrhage; PRP panretinal laser photocoagulation; IVK intravitreal kenalog; VMT vitreomacular traction; MH Macular hole;  NVD neovascularization of the disc; NVE neovascularization elsewhere; AREDS age related eye disease study; ARMD age related macular degeneration; POAG primary open angle glaucoma; EBMD epithelial/anterior basement membrane dystrophy; ACIOL anterior chamber intraocular lens; IOL intraocular lens; PCIOL posterior chamber intraocular lens; Phaco/IOL phacoemulsification with intraocular lens placement; [place] photorefractive keratectomy; LASIK laser assisted in situ keratomileusis; HTN hypertension; DM diabetes mellitus; COPD chronic obstructive pulmonary disease

## 2021-08-02 NOTE — Assessment & Plan Note (Signed)
Component of wet AMD improved and stable OD at this time at 6-week follow-up

## 2021-08-02 NOTE — Assessment & Plan Note (Signed)
Vastly improved OD today at 6-week follow-up post Eylea.  Stabilized condition, subfoveal disciform scar, no intraretinal fluid.  Repeat evaluation after injection today and 8-week

## 2021-08-03 ENCOUNTER — Encounter (INDEPENDENT_AMBULATORY_CARE_PROVIDER_SITE_OTHER): Payer: Commercial Managed Care - PPO | Admitting: Ophthalmology

## 2021-08-14 DIAGNOSIS — Z86718 Personal history of other venous thrombosis and embolism: Secondary | ICD-10-CM | POA: Insufficient documentation

## 2021-08-14 DIAGNOSIS — E871 Hypo-osmolality and hyponatremia: Secondary | ICD-10-CM | POA: Insufficient documentation

## 2021-08-14 DIAGNOSIS — R202 Paresthesia of skin: Secondary | ICD-10-CM | POA: Insufficient documentation

## 2021-08-14 DIAGNOSIS — I82409 Acute embolism and thrombosis of unspecified deep veins of unspecified lower extremity: Secondary | ICD-10-CM | POA: Insufficient documentation

## 2021-08-17 ENCOUNTER — Other Ambulatory Visit: Payer: Self-pay | Admitting: *Deleted

## 2021-08-17 DIAGNOSIS — R202 Paresthesia of skin: Secondary | ICD-10-CM

## 2021-08-24 ENCOUNTER — Other Ambulatory Visit: Payer: Commercial Managed Care - PPO

## 2021-09-01 ENCOUNTER — Other Ambulatory Visit: Payer: Self-pay

## 2021-09-01 ENCOUNTER — Ambulatory Visit
Admission: RE | Admit: 2021-09-01 | Discharge: 2021-09-01 | Disposition: A | Discharge status: Home / Self Care | Payer: Medicare Other | Source: Ambulatory Visit | Attending: *Deleted | Admitting: *Deleted

## 2021-09-01 DIAGNOSIS — R202 Paresthesia of skin: Secondary | ICD-10-CM

## 2021-09-28 ENCOUNTER — Encounter (INDEPENDENT_AMBULATORY_CARE_PROVIDER_SITE_OTHER): Payer: Commercial Managed Care - PPO | Admitting: Ophthalmology

## 2021-10-05 ENCOUNTER — Other Ambulatory Visit: Payer: Self-pay

## 2021-10-05 ENCOUNTER — Encounter (INDEPENDENT_AMBULATORY_CARE_PROVIDER_SITE_OTHER): Payer: Self-pay | Admitting: Ophthalmology

## 2021-10-05 ENCOUNTER — Ambulatory Visit (INDEPENDENT_AMBULATORY_CARE_PROVIDER_SITE_OTHER): Payer: Commercial Managed Care - PPO | Admitting: Ophthalmology

## 2021-10-05 DIAGNOSIS — H35712 Central serous chorioretinopathy, left eye: Secondary | ICD-10-CM | POA: Diagnosis not present

## 2021-10-05 DIAGNOSIS — H353211 Exudative age-related macular degeneration, right eye, with active choroidal neovascularization: Secondary | ICD-10-CM

## 2021-10-05 DIAGNOSIS — H2511 Age-related nuclear cataract, right eye: Secondary | ICD-10-CM | POA: Diagnosis not present

## 2021-10-05 MED ORDER — AFLIBERCEPT 2MG/0.05ML IZ SOLN FOR KALEIDOSCOPE
2.0000 mg | INTRAVITREAL | Status: AC | PRN
  Administered 2021-10-05: 2 mg via INTRAVITREAL

## 2021-10-05 NOTE — Assessment & Plan Note (Signed)
Patient continues on eplerenone, subretinal hyper reflective material continues to be diminishing over time as compared to when patient required to use systemic steroids ?

## 2021-10-05 NOTE — Assessment & Plan Note (Signed)
Now stable and no recurrence worsening of CME, intraretinal fluid or subretinal fluid at 9-week interval and stability of acuity.  We will repeat injection OD today of Eylea and follow-up again in 8 to 9 weeks ?

## 2021-10-05 NOTE — Progress Notes (Signed)
10/05/2021     CHIEF COMPLAINT Patient presents for  Chief Complaint  Patient presents with   Macular Degeneration      HISTORY OF PRESENT ILLNESS: Jordan Hanna is a 61 y.o. male who presents to the clinic today for:   HPI   8 weeks for dilate, OD EYLEA OCT. Pt states no changes in vision. Pt stated had prostate biopsy and resulted in early stages of prostate cancer. Pt just found out last thursday.    Last edited by Angeline Slim on 10/05/2021  3:52 PM.      Referring physician: Paulita Cradle Titus Dubin, MD No address on file  HISTORICAL INFORMATION:   Selected notes from the MEDICAL RECORD NUMBER       CURRENT MEDICATIONS: No current outpatient medications on file. (Ophthalmic Drugs)   No current facility-administered medications for this visit. (Ophthalmic Drugs)   Current Outpatient Medications (Other)  Medication Sig   albuterol (VENTOLIN HFA) 108 (90 Base) MCG/ACT inhaler Inhale 1-2 puffs into the lungs every 6 (six) hours as needed for shortness of breath.   Belatacept (NULOJIX IV) Inject into the vein. Takes the 12th of every month-infusion   benzonatate (TESSALON PERLES) 100 MG capsule Take 1 capsule (100 mg total) by mouth 3 (three) times daily as needed for cough.   chlorpheniramine-HYDROcodone (TUSSIONEX PENNKINETIC ER) 10-8 MG/5ML SUER Take 5 mLs by mouth every 12 (twelve) hours as needed for cough.   doxycycline (VIBRAMYCIN) 100 MG capsule Take 1 capsule (100 mg total) by mouth 2 (two) times daily.   ELIQUIS 5 MG TABS tablet Take 2.5 mg by mouth 2 (two) times daily.   eplerenone (INSPRA) 50 MG tablet Take 50 mg by mouth daily.   hydroxychloroquine (PLAQUENIL) 200 MG tablet Take 200 mg by mouth 2 (two) times daily.   lisinopril (ZESTRIL) 5 MG tablet Take 5 mg by mouth daily.   mirtazapine (REMERON) 30 MG tablet Take 30 mg by mouth at bedtime.   sirolimus (RAPAMUNE) 1 MG tablet Take 2 mg by mouth See admin instructions. 2 tablets every other day 1 tablet on  the odd days   sulfamethoxazole-trimethoprim (BACTRIM DS) 800-160 MG tablet Take 1 tablet by mouth 3 (three) times a week.   zolpidem (AMBIEN) 5 MG tablet Take 5 mg by mouth at bedtime as needed.   No current facility-administered medications for this visit. (Other)      REVIEW OF SYSTEMS: ROS   Negative for: Constitutional, Gastrointestinal, Neurological, Skin, Genitourinary, Musculoskeletal, HENT, Endocrine, Cardiovascular, Eyes, Respiratory, Psychiatric, Allergic/Imm, Heme/Lymph Last edited by Angeline Slim on 10/05/2021  3:52 PM.       ALLERGIES Allergies  Allergen Reactions   Prednisone Swelling    Swelling behind the eye that causes not being able to see    PAST MEDICAL HISTORY Past Medical History:  Diagnosis Date   Anemia in chronic kidney disease (CKD)    Chronic kidney disease    Hypertension    Past Surgical History:  Procedure Laterality Date   LASIK Bilateral 1999    FAMILY HISTORY Family History  Problem Relation Age of Onset   Diabetes Mother    Hypertension Father    Diabetes Father     SOCIAL HISTORY Social History   Tobacco Use   Smoking status: Never   Smokeless tobacco: Never         OPHTHALMIC EXAM:  Base Eye Exam     Visual Acuity (ETDRS)       Right Left   Dist  cc 20/40 20/20   Dist ph cc NI          Tonometry (Tonopen, 3:59 PM)       Right Left   Pressure 10 9         Pupils       Dark Light Shape React APD   Right 5 4 Round Brisk None   Left 5 4 Round Brisk None         Visual Fields       Left Right    Full Full         Extraocular Movement       Right Left    Full Full         Neuro/Psych     Oriented x3: Yes   Mood/Affect: Normal         Dilation     Right eye: 1.0% Mydriacyl, 2.5% Phenylephrine @ 3:59 PM           Slit Lamp and Fundus Exam     External Exam       Right Left   External Normal Normal         Slit Lamp Exam       Right Left   Lids/Lashes 1+  Dermatochalasis - upper lid 1+ Dermatochalasis - upper lid   Conjunctiva/Sclera White and quiet White and quiet   Cornea Clear Clear   Anterior Chamber Deep and quiet Deep and quiet   Iris Round and reactive Round and reactive   Lens 1+ Nuclear sclerosis 1+ Nuclear sclerosis   Anterior Vitreous Normal Normal         Fundus Exam       Right Left   Posterior Vitreous Posterior vitreous detachment, Central vitreous floaters    Disc Normal    C/D Ratio 0.5    Macula Disciform scar, Retinal pigment epithelial mottling, Retinal pigment epithelial atrophy, no hemorrhage, no exudates, Subretinal fibrosis    Vessels Normal    Periphery Normal             IMAGING AND PROCEDURES  Imaging and Procedures for 10/05/21  OCT, Retina - OU - Both Eyes       Right Eye Quality was good. Scan locations included subfoveal. Central Foveal Thickness: 334. Progression has improved. Findings include abnormal foveal contour, epiretinal membrane, subretinal hyper-reflective material.   Left Eye Quality was good. Scan locations included subfoveal. Central Foveal Thickness: 246. Progression has improved. Findings include abnormal foveal contour.   Notes OD with much less intraretinal fluid overlying CNVM at 9 weeks post Eylea thus this is a CNVM sensitive to Eylea condition OD will repeat injection today  OS, now with much less subretinal fluid, on higher dose of systemic eplerenone.     Intravitreal Injection, Pharmacologic Agent - OD - Right Eye       Time Out 10/05/2021. 4:08 PM. Confirmed correct patient, procedure, site, and patient consented.   Anesthesia Topical anesthesia was used. Anesthetic medications included Lidocaine 4%.   Procedure Preparation included 5% betadine to ocular surface, 10% betadine to eyelids. A 30 gauge needle was used.   Injection: 2 mg aflibercept 2 MG/0.05ML   Route: Intravitreal, Site: Right Eye   NDC: [id number], Lot: [id number], Waste: 0 mL    Post-op Post injection exam found visual acuity of at least counting fingers. The patient tolerated the procedure well. There were no complications. The patient received written and verbal post procedure care education. Post injection medications included ocuflox.  ASSESSMENT/PLAN:  Exudative age-related macular degeneration of right eye with active choroidal neovascularization (HCC) Now stable and no recurrence worsening of CME, intraretinal fluid or subretinal fluid at 9-week interval and stability of acuity.  We will repeat injection OD today of Eylea and follow-up again in 8 to 9 weeks  Nuclear sclerotic cataract of right eye OD still minor will observe  Central serous retinopathy, left Patient continues on eplerenone, subretinal hyper reflective material continues to be diminishing over time as compared to when patient required to use systemic steroids     ICD-10-CM   1. Exudative age-related macular degeneration of right eye with active choroidal neovascularization (HCC)  H35.3211 OCT, Retina - OU - Both Eyes    Intravitreal Injection, Pharmacologic Agent - OD - Right Eye    aflibercept (EYLEA) SOLN 2 mg    2. Nuclear sclerotic cataract of right eye  H25.11     3. Central serous retinopathy, left  H35.712       1.  OD stabilized CNVM related to previous chronic CS CR.  Today at 9-week interval no recurrence and stable acuity.  We will repeat injection today of Eylea maintain 9-week interval.  No interference with his ongoing therapy for newly diagnosed prostate CA  2.  3.  Ophthalmic Meds Ordered this visit:  Meds ordered this encounter  Medications   aflibercept (EYLEA) SOLN 2 mg       Return in about 9 weeks (around 12/07/2021) for dilate, OD, EYLEA OCT.  There are no Patient Instructions on file for this visit.   Explained the diagnoses, plan, and follow up with the patient and they expressed understanding.  Patient expressed understanding  of the importance of proper follow up care.   Alford Highland Shinika Estelle M.D. Diseases & Surgery of the Retina and Vitreous [facility] 10/05/21     Abbreviations: M myopia (nearsighted); A astigmatism; H hyperopia (farsighted); P presbyopia; Mrx spectacle prescription;  CTL contact lenses; OD right eye; OS left eye; OU both eyes  XT exotropia; ET esotropia; PEK punctate epithelial keratitis; PEE punctate epithelial erosions; DES dry eye syndrome; MGD meibomian gland dysfunction; ATs artificial tears; PFAT's preservative free artificial tears; [place] nuclear sclerotic cataract; PSC posterior subcapsular cataract; ERM epi-retinal membrane; PVD posterior vitreous detachment; RD retinal detachment; DM diabetes mellitus; DR diabetic retinopathy; NPDR non-proliferative diabetic retinopathy; PDR proliferative diabetic retinopathy; CSME clinically significant macular edema; DME diabetic macular edema; dbh dot blot hemorrhages; CWS cotton wool spot; POAG primary open angle glaucoma; C/D cup-to-disc ratio; HVF humphrey visual field; GVF goldmann visual field; OCT optical coherence tomography; IOP intraocular pressure; BRVO Branch retinal vein occlusion; CRVO central retinal vein occlusion; CRAO central retinal artery occlusion; BRAO branch retinal artery occlusion; RT retinal tear; SB scleral buckle; PPV pars plana vitrectomy; VH Vitreous hemorrhage; PRP panretinal laser photocoagulation; IVK intravitreal kenalog; VMT vitreomacular traction; MH Macular hole;  NVD neovascularization of the disc; NVE neovascularization elsewhere; AREDS age related eye disease study; ARMD age related macular degeneration; POAG primary open angle glaucoma; EBMD epithelial/anterior basement membrane dystrophy; ACIOL anterior chamber intraocular lens; IOL intraocular lens; PCIOL posterior chamber intraocular lens; Phaco/IOL phacoemulsification with intraocular lens placement; PRK photorefractive keratectomy; LASIK laser assisted in  situ keratomileusis; HTN hypertension; DM diabetes mellitus; COPD chronic obstructive pulmonary disease

## 2021-10-05 NOTE — Assessment & Plan Note (Signed)
OD still minor will observe ?

## 2021-10-13 ENCOUNTER — Other Ambulatory Visit (HOSPITAL_COMMUNITY): Payer: Self-pay | Admitting: Urology

## 2021-10-13 DIAGNOSIS — C61 Malignant neoplasm of prostate: Secondary | ICD-10-CM

## 2021-10-27 ENCOUNTER — Encounter: Payer: Self-pay | Admitting: Radiation Oncology

## 2021-11-01 NOTE — Progress Notes (Signed)
GU Location of Tumor / Histology: Prostate Ca ? ?If Prostate Cancer, Gleason Score is (4 + 3) and PSA is (14.9 as of 06/2021) ? ?Biopsies: ?Dr. Junious Silk ? ? ? ? ?Past/Anticipated interventions by urology, if any:  ? ?Past/Anticipated interventions by medical oncology, if any:  ? ?Weight changes, if any:  No, weight stable. ? ?IPSS:  7 ?SHIM:  10 ? ?Bowel/Bladder complaints, if any:  No ? ?Nausea/Vomiting, if any: No ? ?Pain issues, if any:  0/10 ? ?SAFETY ISSUES: ?Prior radiation? No ?Pacemaker/ICD? No ?Possible current pregnancy? Male ?Is the patient on methotrexate? No ? ?Current Complaints / other details:  Need more information on treatment options. ?

## 2021-11-02 ENCOUNTER — Ambulatory Visit (HOSPITAL_COMMUNITY)
Admission: RE | Admit: 2021-11-02 | Discharge: 2021-11-02 | Disposition: A | Discharge status: Home / Self Care | Payer: Commercial Managed Care - PPO | Source: Ambulatory Visit | Attending: Urology | Admitting: Urology

## 2021-11-02 DIAGNOSIS — C61 Malignant neoplasm of prostate: Secondary | ICD-10-CM | POA: Insufficient documentation

## 2021-11-02 MED ORDER — PIFLIFOLASTAT F 18 (PYLARIFY) INJECTION
9.0000 | Freq: Once | INTRAVENOUS | Status: AC
  Administered 2021-11-02: 9 via INTRAVENOUS

## 2021-11-07 ENCOUNTER — Other Ambulatory Visit: Payer: Self-pay

## 2021-11-07 ENCOUNTER — Ambulatory Visit
Admission: RE | Admit: 2021-11-07 | Discharge: 2021-11-07 | Disposition: A | Discharge status: Home / Self Care | Payer: Commercial Managed Care - PPO | Source: Ambulatory Visit | Attending: Radiation Oncology | Admitting: Radiation Oncology

## 2021-11-07 VITALS — BP 129/90 | HR 70 | Temp 97.2°F | Resp 18 | Ht 69.0 in | Wt 176.0 lb

## 2021-11-07 DIAGNOSIS — Z79899 Other long term (current) drug therapy: Secondary | ICD-10-CM | POA: Diagnosis not present

## 2021-11-07 DIAGNOSIS — I120 Hypertensive chronic kidney disease with stage 5 chronic kidney disease or end stage renal disease: Secondary | ICD-10-CM | POA: Insufficient documentation

## 2021-11-07 DIAGNOSIS — Z7901 Long term (current) use of anticoagulants: Secondary | ICD-10-CM | POA: Diagnosis not present

## 2021-11-07 DIAGNOSIS — N186 End stage renal disease: Secondary | ICD-10-CM | POA: Diagnosis not present

## 2021-11-07 DIAGNOSIS — Z940 Kidney transplant status: Secondary | ICD-10-CM | POA: Diagnosis not present

## 2021-11-07 DIAGNOSIS — N400 Enlarged prostate without lower urinary tract symptoms: Secondary | ICD-10-CM | POA: Insufficient documentation

## 2021-11-07 DIAGNOSIS — D225 Melanocytic nevi of trunk: Secondary | ICD-10-CM | POA: Insufficient documentation

## 2021-11-07 DIAGNOSIS — C61 Malignant neoplasm of prostate: Secondary | ICD-10-CM

## 2021-11-07 DIAGNOSIS — I700 Atherosclerosis of aorta: Secondary | ICD-10-CM | POA: Diagnosis not present

## 2021-11-07 DIAGNOSIS — D631 Anemia in chronic kidney disease: Secondary | ICD-10-CM | POA: Diagnosis not present

## 2021-11-07 NOTE — Progress Notes (Signed)
?Radiation Oncology         [phone] ?________________________________ ? ?Initial Outpatient Consultation ? ?Name: Jordan Hanna MRN: [id number]  ?Date: 11/07/2021  DOB: 20-Feb-1961 ? ?YF:[id number], Cherrie Gauze., MD  Festus Aloe, MD  ? ?REFERRING PHYSICIAN: Festus Aloe, MD ? ?DIAGNOSIS: 61 y.o. gentleman with Stage T1c adenocarcinoma of the prostate with Gleason score of 4+3, and PSA of 14.9. ? ?  ICD-10-CM   ?1. Malignant neoplasm of prostate ([place])  C61   ?  ? ? ?HISTORY OF PRESENT ILLNESS: Jordan Hanna is a 61 y.o. male with a diagnosis of prostate cancer. He was initially referred to Dr. Junious Silk in 07/2020 for BPH, renal cysts, ED, and ESRD. His PSA was 3.67 in 03/2020. Of note, he underwent kidney transplant in 09/2020 and a repeat PSA had increased to 9.3 in 03/2021.  At the time of follow up office visit, the digital rectal examination was performed revealing no nodules. PSA increased further to 12.5 in 04/2021, prompting a prostate MRI which was performed on 05/28/21 showing no radiographic evidence of high grade prostate carcinoma (PI-RADS 2). A repeat PSA in 06/2021 increased further to 14.9 so the patient proceeded to transrectal ultrasound with 12 biopsies of the prostate on 09/22/21.  The prostate volume measured 44 cc.  Out of 12 core biopsies, 8 were positive, including all left-sided cores.  The maximum Gleason score was 4+3, and this was seen in the  left mid, left base (with perineural invasion), and left base lateral. Additionally, Gleason 3+4 was seen in the left mid lateral, left apex, and right mid, and Gleason 3+3 in the right base and left apex lateral. ? ?He underwent PSMA PET scan on 11/02/21 showing nonspecific radiotracer activity within the prostate gland but no evidence of metastatic adenopathy, visceral metastasis, or skeletal metastasis. The radiotracer activity in two anteromedial right ribs is favored related to prior rib fracture. ? ?The patient reviewed the biopsy  results with his urologist and he has kindly been referred today for discussion of potential radiation treatment options. ? ? ?PREVIOUS RADIATION THERAPY: No ? ?PAST MEDICAL HISTORY:  ?Past Medical History:  ?Diagnosis Date  ? Anemia in chronic kidney disease (CKD)   ? Chronic kidney disease   ? Hypertension   ?   ? ?PAST SURGICAL HISTORY: ?Past Surgical History:  ?Procedure Laterality Date  ? LASIK Bilateral 1999  ? ? ?FAMILY HISTORY:  ?Family History  ?Problem Relation Age of Onset  ? Diabetes Mother   ? Hypertension Father   ? Diabetes Father   ? ? ?SOCIAL HISTORY:  ?Social History  ? ?Socioeconomic History  ? Marital status: Married  ?  Spouse name: Not on file  ? Number of children: Not on file  ? Years of education: Not on file  ? Highest education level: Not on file  ?Occupational History  ? Not on file  ?Tobacco Use  ? Smoking status: Never  ? Smokeless tobacco: Never  ?Substance and Sexual Activity  ? Alcohol use: Not on file  ? Drug use: Not on file  ? Sexual activity: Not on file  ?Other Topics Concern  ? Not on file  ?Social History Narrative  ? Not on file  ? ?Social Determinants of Health  ? ?Financial Resource Strain: Not on file  ?Food Insecurity: Not on file  ?Transportation Needs: Not on file  ?Physical Activity: Not on file  ?Stress: Not on file  ?Social Connections: Not on file  ?Intimate Partner Violence: Not on file  ? ? ?  ALLERGIES: Prednisone ? ?MEDICATIONS:  ?Current Outpatient Medications  ?Medication Sig Dispense Refill  ? albuterol (VENTOLIN HFA) 108 (90 Base) MCG/ACT inhaler Inhale 1-2 puffs into the lungs every 6 (six) hours as needed for shortness of breath. 1 each 0  ? Belatacept (NULOJIX IV) Inject into the vein. Takes the 12th of every month-infusion    ? benzonatate (TESSALON PERLES) 100 MG capsule Take 1 capsule (100 mg total) by mouth 3 (three) times daily as needed for cough. 20 capsule 0  ? chlorpheniramine-HYDROcodone (TUSSIONEX PENNKINETIC ER) 10-8 MG/5ML SUER Take 5 mLs by  mouth every 12 (twelve) hours as needed for cough. 140 mL 0  ? doxycycline (VIBRAMYCIN) 100 MG capsule Take 1 capsule (100 mg total) by mouth 2 (two) times daily. 20 capsule 0  ? ELIQUIS 5 MG TABS tablet Take 2.5 mg by mouth 2 (two) times daily.    ? eplerenone (INSPRA) 50 MG tablet Take 50 mg by mouth daily.    ? hydroxychloroquine (PLAQUENIL) 200 MG tablet Take 200 mg by mouth 2 (two) times daily.    ? lisinopril (ZESTRIL) 5 MG tablet Take 5 mg by mouth daily.    ? mirtazapine (REMERON) 30 MG tablet Take 30 mg by mouth at bedtime.    ? sirolimus (RAPAMUNE) 1 MG tablet Take 2 mg by mouth See admin instructions. 2 tablets every other day 1 tablet on the odd days    ? sulfamethoxazole-trimethoprim (BACTRIM DS) 800-160 MG tablet Take 1 tablet by mouth 3 (three) times a week.    ? zolpidem (AMBIEN) 5 MG tablet Take 5 mg by mouth at bedtime as needed.    ? ?No current facility-administered medications for this encounter.  ? ? ?REVIEW OF SYSTEMS:  On review of systems, the patient reports that he is doing well overall. He denies any chest pain, shortness of breath, cough, fevers, chills, night sweats, unintended weight changes. He denies any bowel disturbances, and denies abdominal pain, nausea or vomiting. He denies any new musculoskeletal or joint aches or pains. His IPSS was 7, indicating mild urinary symptoms. His SHIM was 10, indicating he has moderate erectile dysfunction. A complete review of systems is obtained and is otherwise negative. ? ?  ?PHYSICAL EXAM:  ?Wt Readings from Last 3 Encounters:  ?11/07/21 176 lb (79.8 kg)  ?06/06/21 158 lb (71.7 kg)  ?05/13/20 177 lb 3.2 oz (80.4 kg)  ? ?Temp Readings from Last 3 Encounters:  ?11/07/21 (!) 97.2 ?F (36.2 ?C) (Temporal)  ?06/06/21 99.2 ?F (37.3 ?C)  ?05/31/21 98.7 ?F (37.1 ?C)  ? ?BP Readings from Last 3 Encounters:  ?11/07/21 129/90  ?06/06/21 (!) 131/97  ?05/31/21 (!) 148/97  ? ?Pulse Readings from Last 3 Encounters:  ?11/07/21 70  ?06/06/21 88  ?05/31/21 85   ? ?Pain Assessment ?Pain Score: 0-No pain/10 ? ?In general this is a well appearing Caucasian male in no acute distress. He's alert and oriented x4 and appropriate throughout the examination. Cardiopulmonary assessment is negative for acute distress, and he exhibits normal effort.   ? ? ?KPS = 100 ? ?100 - Normal; no complaints; no evidence of disease. ?90   - Able to carry on normal activity; minor signs or symptoms of disease. ?80   - Normal activity with effort; some signs or symptoms of disease. ?65   - Cares for self; unable to carry on normal activity or to do active work. ?60   - Requires occasional assistance, but is able to care for most of his  personal needs. ?50   - Requires considerable assistance and frequent medical care. ?85   - Disabled; requires special care and assistance. ?30   - Severely disabled; hospital admission is indicated although death not imminent. ?20   - Very sick; hospital admission necessary; active supportive treatment necessary. ?10   - Moribund; fatal processes progressing rapidly. ?0     - Dead ? ?Karnofsky DA, Abelmann WH, Craver LS and Burchenal [facility] [phone]) The use of the nitrogen mustards in the palliative treatment of carcinoma: with particular reference to bronchogenic carcinoma Cancer 1 634-56 ? ?LABORATORY DATA:  ?Lab Results  ?Component Value Date  ? WBC 6.7 06/06/2021  ? HGB 10.0 (L) 06/06/2021  ? HCT 31.1 (L) 06/06/2021  ? MCV 88.4 06/06/2021  ? PLT 201 06/06/2021  ? ?Lab Results  ?Component Value Date  ? NA 130 (L) 06/06/2021  ? K 5.0 06/06/2021  ? CL 103 06/06/2021  ? CO2 19 (L) 06/06/2021  ? ?No results found for: ALT, AST, GGT, ALKPHOS, BILITOT ?  ?RADIOGRAPHY: NM PET (PSMA) SKULL TO MID THIGH ? ?Result Date: 11/06/2021 ?CLINICAL DATA:  Prostate carcinoma with biochemical recurrence. EXAM: NUCLEAR MEDICINE PET SKULL BASE TO THIGH TECHNIQUE: 8.0 mCi F18 Piflufolastat (Pylarify) was injected intravenously. Full-ring PET imaging was performed from the skull base to thigh  after the radiotracer. CT data was obtained and used for attenuation correction and anatomic localization. COMPARISON:  None. FINDINGS: NECK No radiotracer activity in neck lymph nodes. Incidental CT finding:

## 2021-11-07 NOTE — Progress Notes (Signed)
Introduced myself to the patient as the prostate nurse navigator and explained my role.  He is here to discuss his radiation treatment options.  Will continue to follow to ensure navigation needs are met.  I gave him my business card and asked him to call me with questions or concerns.  Verbalized understanding.  ?

## 2021-11-15 ENCOUNTER — Other Ambulatory Visit: Payer: Self-pay | Admitting: Urology

## 2021-11-24 IMAGING — US US RENAL
2 series · 13 of 25 positions shown · non-contrast
Comparison: None.

CLINICAL DATA: Initial evaluation for chronic kidney disease, stage
III.

EXAM:
RENAL / URINARY TRACT ULTRASOUND COMPLETE

[Series 1: us renal · 0.23mm/px · 12 of 57 slices shown (1 of 2)]
[im 1/57]
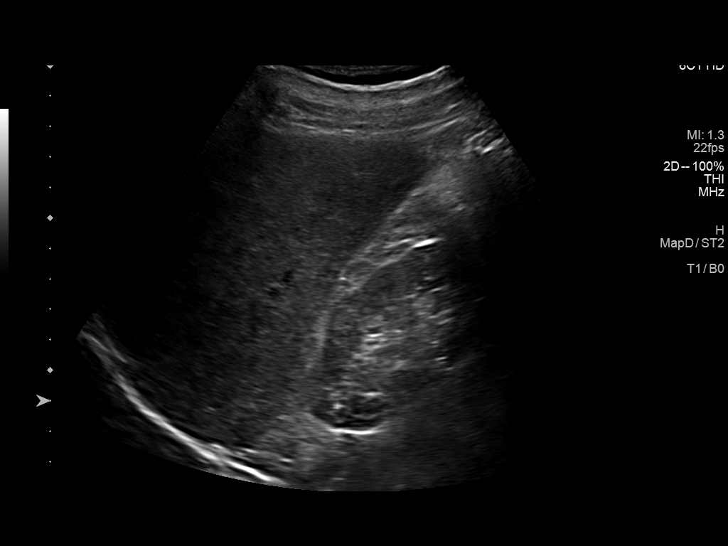
[im 5/57]
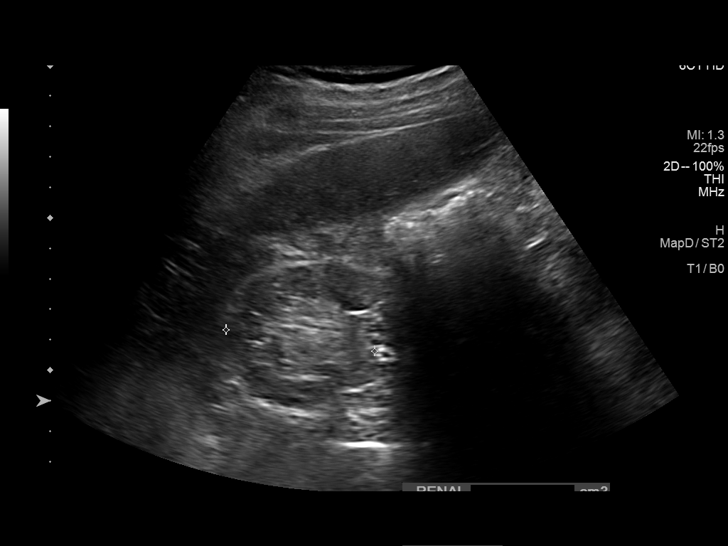
[im 10/57]
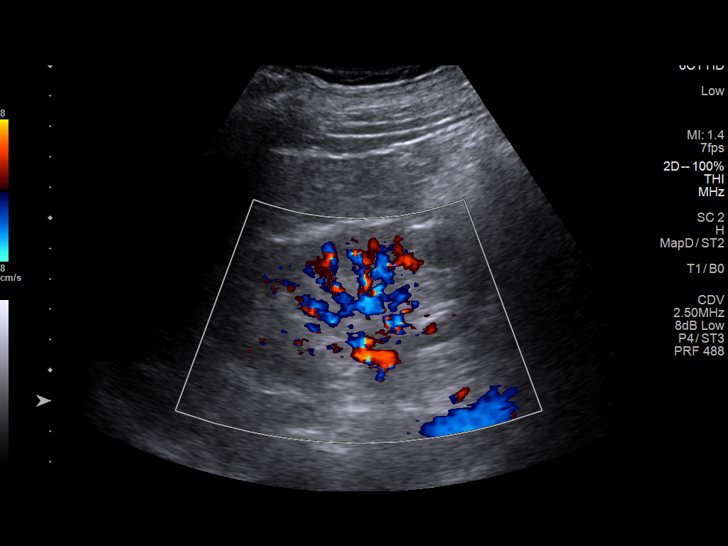
[im 15/57]
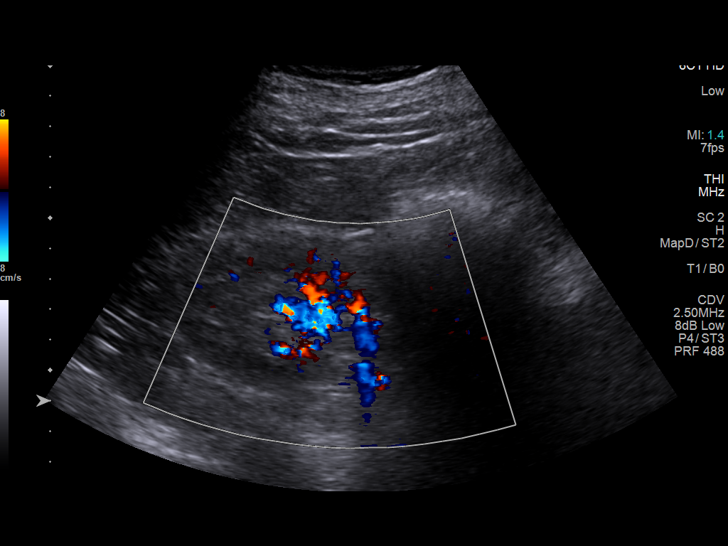
[im 20/57]
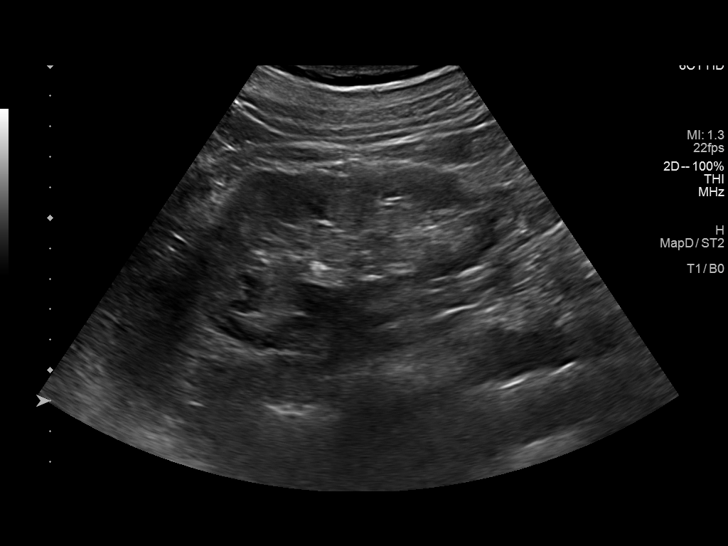
[im 25/57]
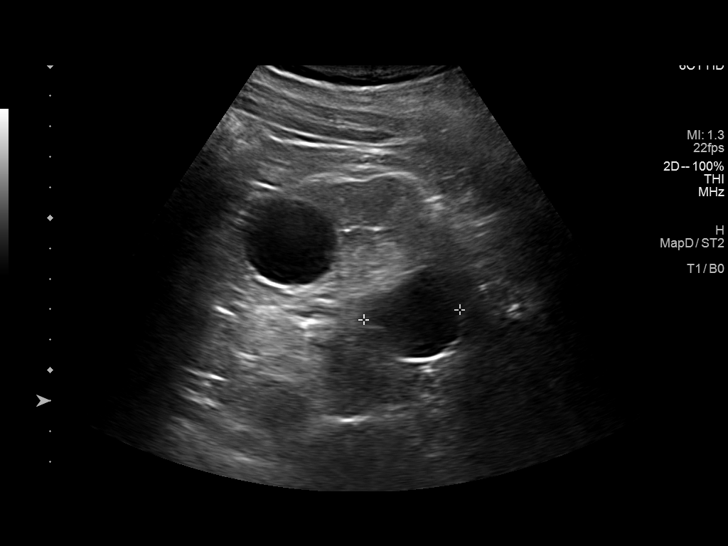
[im 30/57]
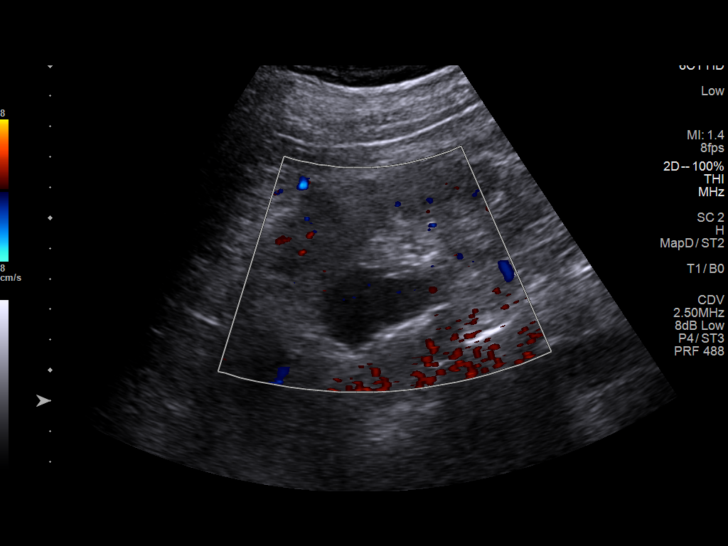
[im 35/57]
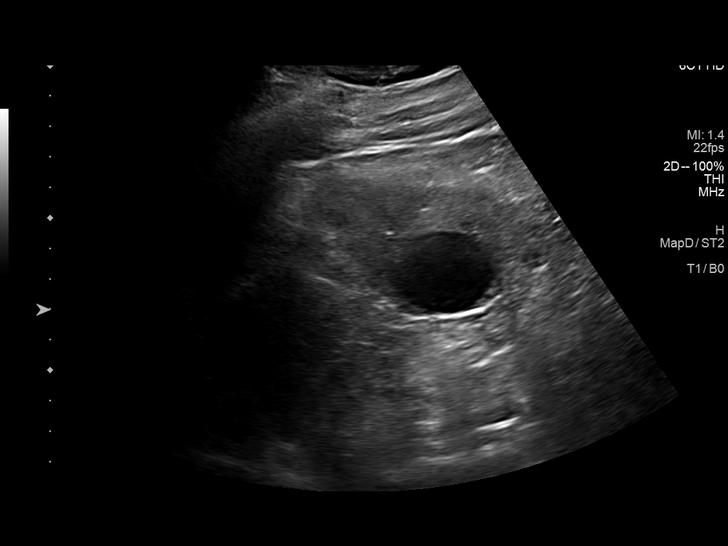
[im 39/57]
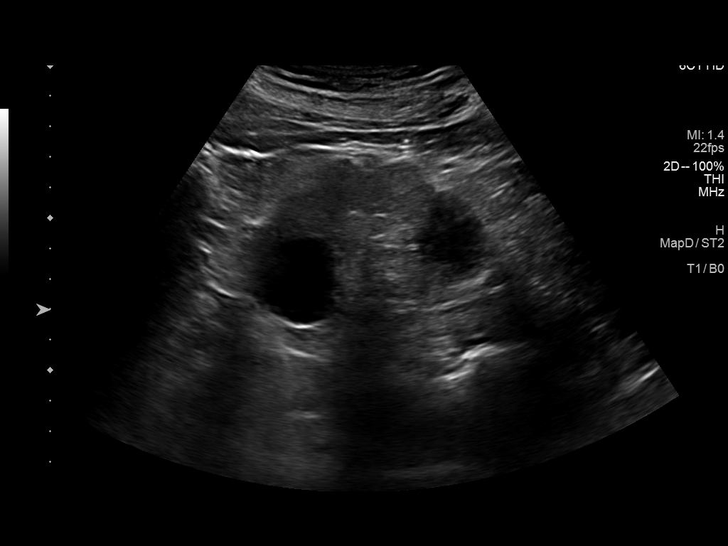
[im 44/57]
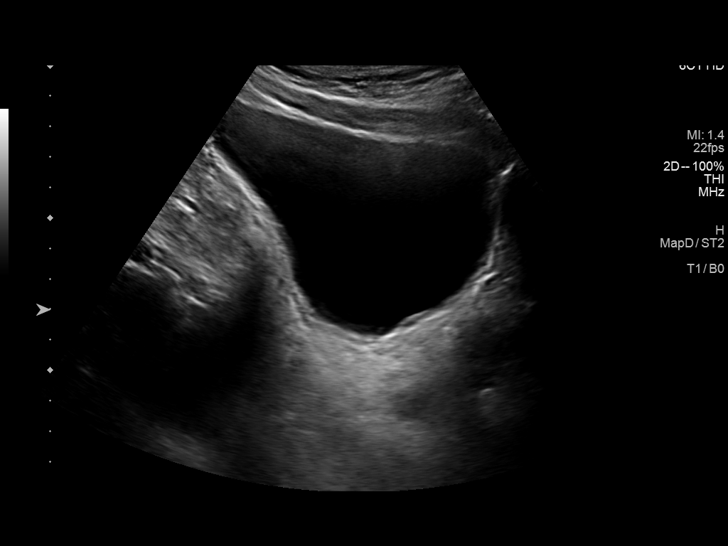
[im 49/57]
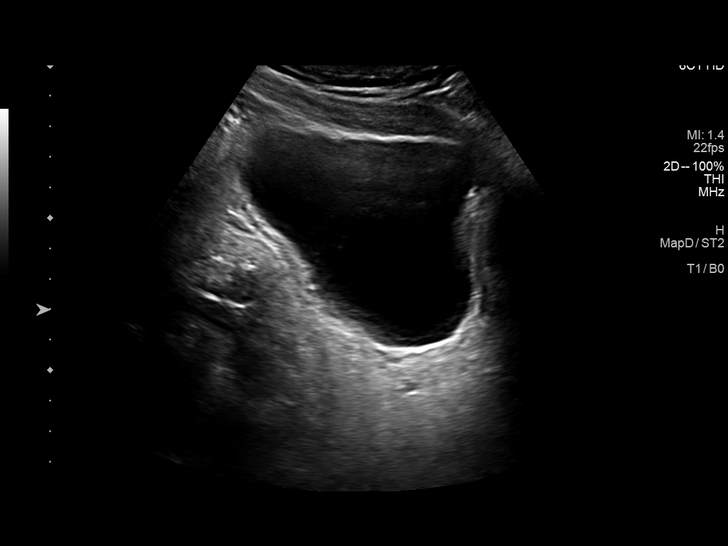
[im 54/57]
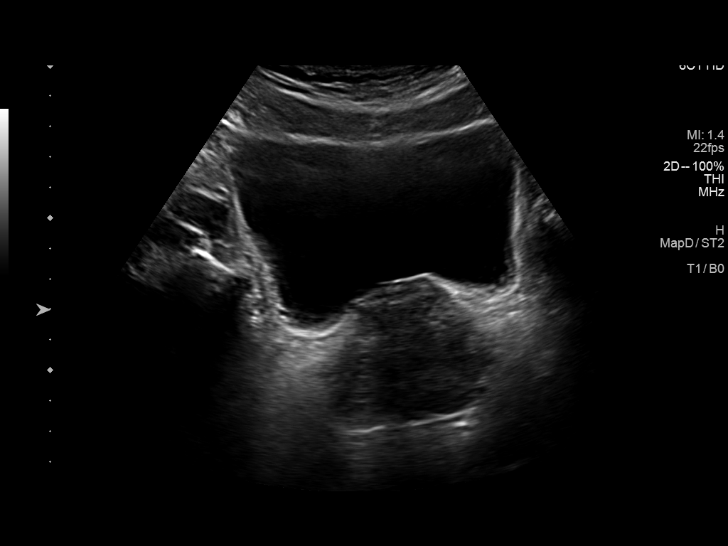

[Series 3: us renal · 0.23mm/px · 1 of 2 slices shown (2 of 2)]
[im 1/2]
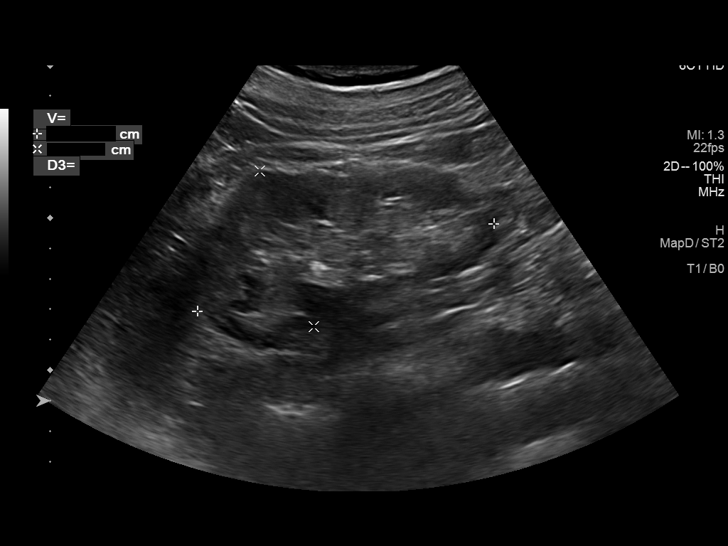

[13 of 25 positions shown; findings below may reference images not displayed]

FINDINGS: Right Kidney:

Renal measurements: 9.1 x 4.0 x 4.9 cm = volume: 95 mL. Increased
echogenicity seen within the renal parenchyma. No nephrolithiasis or
hydronephrosis. No focal renal mass.

Left Kidney:

Renal measurements: 10.1 x 5.4 x 5.5 cm = volume: 157 mL. Increased
echogenicity seen within the renal parenchyma. No nephrolithiasis or
hydronephrosis. 4.1 x 2.5 x 3.2 cm cyst noted within the interpolar
region. An additional 3.2 x 2.7 x 3.3 cm cyst also noted within the
interpolar region. These are largely simple and benign in
appearance.

Bladder:

Appears normal for degree of bladder distention.

Other:

Enlarged and somewhat nodular prostate measures 4.9 x 5.6 x 4.9 cm
(estimated volume 70 cc).
IMPRESSION: 1. Increased echogenicity within the renal parenchyma, compatible
with medical renal disease.
2. No hydronephrosis.
3. Two simple cysts measuring up to 4.1 cm within the interpolar
left kidney as above.
4. Enlarged prostate.

## 2021-12-07 ENCOUNTER — Encounter (INDEPENDENT_AMBULATORY_CARE_PROVIDER_SITE_OTHER): Payer: Self-pay | Admitting: Ophthalmology

## 2021-12-07 ENCOUNTER — Ambulatory Visit (INDEPENDENT_AMBULATORY_CARE_PROVIDER_SITE_OTHER): Payer: Commercial Managed Care - PPO | Admitting: Ophthalmology

## 2021-12-07 ENCOUNTER — Encounter (INDEPENDENT_AMBULATORY_CARE_PROVIDER_SITE_OTHER): Payer: Commercial Managed Care - PPO | Admitting: Ophthalmology

## 2021-12-07 DIAGNOSIS — H353211 Exudative age-related macular degeneration, right eye, with active choroidal neovascularization: Secondary | ICD-10-CM | POA: Diagnosis not present

## 2021-12-07 DIAGNOSIS — H35712 Central serous chorioretinopathy, left eye: Secondary | ICD-10-CM

## 2021-12-07 DIAGNOSIS — H2511 Age-related nuclear cataract, right eye: Secondary | ICD-10-CM | POA: Diagnosis not present

## 2021-12-07 MED ORDER — AFLIBERCEPT 2MG/0.05ML IZ SOLN FOR KALEIDOSCOPE
2.0000 mg | INTRAVITREAL | Status: AC | PRN
  Administered 2021-12-07: 2 mg via INTRAVITREAL

## 2021-12-07 NOTE — Assessment & Plan Note (Signed)
Moderate OD 

## 2021-12-07 NOTE — Assessment & Plan Note (Signed)
OS stable no recurrence in the macular region

## 2021-12-07 NOTE — Progress Notes (Signed)
12/07/2021     CHIEF COMPLAINT Patient presents for  Chief Complaint  Patient presents with   Macular Degeneration      HISTORY OF PRESENT ILLNESS: Jordan Hanna is a 61 y.o. male who presents to the clinic today for:   HPI   9 weeks for DILATE, OD, EYLEA, OCT. Pt stated no changes in vision. Pt denies floaters and FOL. Pt is currently taking Orgovyx- '120mG'$     Last edited by Silvestre Moment on 12/07/2021  3:46 PM.      Referring physician: Pendergraft, Darrick Penna, MD No address on file  HISTORICAL INFORMATION:   Selected notes from the [facility]: No current outpatient medications on file. (Ophthalmic Drugs)   No current facility-administered medications for this visit. (Ophthalmic Drugs)   Current Outpatient Medications (Other)  Medication Sig   albuterol (VENTOLIN HFA) 108 (90 Base) MCG/ACT inhaler Inhale 1-2 puffs into the lungs every 6 (six) hours as needed for shortness of breath.   Belatacept (NULOJIX IV) Inject into the vein. Takes the 12th of every month-infusion   benzonatate (TESSALON PERLES) 100 MG capsule Take 1 capsule (100 mg total) by mouth 3 (three) times daily as needed for cough.   chlorpheniramine-HYDROcodone (TUSSIONEX PENNKINETIC ER) 10-8 MG/5ML SUER Take 5 mLs by mouth every 12 (twelve) hours as needed for cough.   doxycycline (VIBRAMYCIN) 100 MG capsule Take 1 capsule (100 mg total) by mouth 2 (two) times daily.   ELIQUIS 5 MG TABS tablet Take 2.5 mg by mouth 2 (two) times daily.   eplerenone (INSPRA) 50 MG tablet Take 50 mg by mouth daily.   hydroxychloroquine (PLAQUENIL) 200 MG tablet Take 200 mg by mouth 2 (two) times daily.   lisinopril (ZESTRIL) 5 MG tablet Take 5 mg by mouth daily.   mirtazapine (REMERON) 30 MG tablet Take 30 mg by mouth at bedtime.   sirolimus (RAPAMUNE) 1 MG tablet Take 2 mg by mouth See admin instructions. 2 tablets every other day 1 tablet on the odd days    sulfamethoxazole-trimethoprim (BACTRIM DS) 800-160 MG tablet Take 1 tablet by mouth 3 (three) times a week.   zolpidem (AMBIEN) 5 MG tablet Take 5 mg by mouth at bedtime as needed.   No current facility-administered medications for this visit. (Other)      REVIEW OF SYSTEMS: ROS   Negative for: Constitutional, Gastrointestinal, Neurological, Skin, Genitourinary, Musculoskeletal, HENT, Endocrine, Cardiovascular, Eyes, Respiratory, Psychiatric, Allergic/Imm, Heme/Lymph Last edited by Silvestre Moment on 12/07/2021  3:33 PM.       ALLERGIES Allergies  Allergen Reactions   Prednisone Swelling    Swelling behind the eye that causes not being able to see    PAST MEDICAL HISTORY Past Medical History:  Diagnosis Date   Anemia in chronic kidney disease (CKD)    Chronic kidney disease    Hypertension    Past Surgical History:  Procedure Laterality Date   LASIK Bilateral 1999    FAMILY HISTORY Family History  Problem Relation Age of Onset   Diabetes Mother    Hypertension Father    Diabetes Father     SOCIAL HISTORY Social History   Tobacco Use   Smoking status: Never   Smokeless tobacco: Never         OPHTHALMIC EXAM:  Base Eye Exam     Visual Acuity (ETDRS)       Right Left   Dist cc 20/40 20/20   Dist ph cc  NI     Correction: Glasses         Tonometry (Tonopen, 3:39 PM)       Right Left   Pressure 11 10         Pupils       Pupils APD   Right PERRL None   Left PERRL None         Visual Fields       Left Right    Full Full         Extraocular Movement       Right Left    Full Full         Neuro/Psych     Oriented x3: Yes   Mood/Affect: Normal         Dilation     Right eye: 2.5% Phenylephrine, 1.0% Mydriacyl @ 3:39 PM           Slit Lamp and Fundus Exam     External Exam       Right Left   External Normal Normal         Slit Lamp Exam       Right Left   Lids/Lashes 1+ Dermatochalasis - upper lid 1+  Dermatochalasis - upper lid   Conjunctiva/Sclera White and quiet White and quiet   Cornea Clear Clear   Anterior Chamber Deep and quiet Deep and quiet   Iris Round and reactive Round and reactive   Lens 1+ Nuclear sclerosis 1+ Nuclear sclerosis   Anterior Vitreous Normal Normal         Fundus Exam       Right Left   Posterior Vitreous Posterior vitreous detachment, Central vitreous floaters    Disc Normal    C/D Ratio 0.5    Macula Disciform scar, Retinal pigment epithelial mottling, Retinal pigment epithelial atrophy, no hemorrhage, no exudates, Subretinal fibrosis    Vessels Normal    Periphery Normal             IMAGING AND PROCEDURES  Imaging and Procedures for 12/07/21  OCT, Retina - OU - Both Eyes       Right Eye Quality was good. Scan locations included subfoveal. Central Foveal Thickness: 352. Progression has improved. Findings include abnormal foveal contour, epiretinal membrane, subretinal hyper-reflective material.   Left Eye Quality was good. Scan locations included subfoveal. Central Foveal Thickness: 246. Progression has improved. Findings include abnormal foveal contour.   Notes OD with much less intraretinal fluid overlying CNVM at 9 weeks post Eylea thus this is a CNVM sensitive to Eylea condition OD will repeat injection today, overall stable OD  OS, now with much less subretinal fluid, on higher dose of systemic eplerenone.  Small amount of fluid peripapillary no extension towards macula remained stable OS     Intravitreal Injection, Pharmacologic Agent - OD - Right Eye       Time Out 12/07/2021. 4:05 PM. Confirmed correct patient, procedure, site, and patient consented.   Anesthesia Topical anesthesia was used. Anesthetic medications included Lidocaine 4%.   Procedure Preparation included 5% betadine to ocular surface, 10% betadine to eyelids. A 30 gauge needle was used.   Injection: 2 mg aflibercept 2 MG/0.05ML   Route: Intravitreal,  Site: Right Eye   NDC: [id number], Lot: [id number], Waste: 0 mL   Post-op Post injection exam found visual acuity of at least counting fingers. The patient tolerated the procedure well. There were no complications. The patient received written and verbal post procedure care education. Post injection  medications included ocuflox.              ASSESSMENT/PLAN:  Central serous retinopathy, left OS stable no recurrence in the macular region  Nuclear sclerotic cataract of right eye Moderate OD  Exudative age-related macular degeneration of right eye with active choroidal neovascularization (HCC) Stable overall, intraretinal fluid much less but still chronic active temporal and superotemporal to FAZ.  Controlled currently on Eylea q. 9 weeks.  Repeat injection today and follow-up again in 9 weeks or earlier if patient develops a conflict in time     [id number]   1. Exudative age-related macular degeneration of right eye with active choroidal neovascularization (HCC)  H35.3211 OCT, Retina - OU - Both Eyes    Intravitreal Injection, Pharmacologic Agent - OD - Right Eye    aflibercept (EYLEA) SOLN 2 mg    2. Central serous retinopathy, left  H35.712     3. Nuclear sclerotic cataract of right eye  H25.11       1.  OD with chronic active CNVM responsive to [facility] but with recurrences.  Repeat Eylea again today at 9-week interval and follow-up again in 9 weeks or sooner  2. History of central serous retinopathy in each eye.  OS stable condition, no recurrence of major CS CR, currently not on steroid therapy systemically  3.  Ophthalmic Meds Ordered this visit:  Meds ordered this encounter  Medications   aflibercept (EYLEA) SOLN 2 mg       Return in about 9 weeks (around 02/08/2022) for dilate, OD, EYLEA OCT.  There are no Patient Instructions on file for this visit.   Explained the diagnoses, plan, and follow up with the patient and they expressed understanding.  Patient  expressed understanding of the importance of proper follow up care.   Clent Demark Jazleen Robeck M.D. Diseases & Surgery of the Retina and Vitreous Retina [facility] Diabetic [place] 12/07/21     Abbreviations: M myopia (nearsighted); A astigmatism; H hyperopia (farsighted); P presbyopia; Mrx spectacle prescription;  CTL contact lenses; OD right eye; OS left eye; OU both eyes  XT exotropia; ET esotropia; PEK punctate epithelial keratitis; PEE punctate epithelial erosions; DES dry eye syndrome; MGD meibomian gland dysfunction; ATs artificial tears; PFAT's preservative free artificial tears; [place] nuclear sclerotic cataract; PSC posterior subcapsular cataract; ERM epi-retinal membrane; PVD posterior vitreous detachment; RD retinal detachment; DM diabetes mellitus; DR diabetic retinopathy; NPDR non-proliferative diabetic retinopathy; PDR proliferative diabetic retinopathy; CSME clinically significant macular edema; DME diabetic macular edema; dbh dot blot hemorrhages; CWS cotton wool spot; POAG primary open angle glaucoma; C/D cup-to-disc ratio; HVF humphrey visual field; GVF goldmann visual field; OCT optical coherence tomography; IOP intraocular pressure; BRVO Branch retinal vein occlusion; CRVO central retinal vein occlusion; CRAO central retinal artery occlusion; BRAO branch retinal artery occlusion; RT retinal tear; SB scleral buckle; PPV pars plana vitrectomy; VH Vitreous hemorrhage; PRP panretinal laser photocoagulation; IVK intravitreal kenalog; VMT vitreomacular traction; MH Macular hole;  NVD neovascularization of the disc; NVE neovascularization elsewhere; AREDS age related eye disease study; ARMD age related macular degeneration; POAG primary open angle glaucoma; EBMD epithelial/anterior basement membrane dystrophy; ACIOL anterior chamber intraocular lens; IOL intraocular lens; PCIOL posterior chamber intraocular lens; Phaco/IOL phacoemulsification with intraocular lens placement; [place] photorefractive keratectomy;  LASIK laser assisted in situ keratomileusis; HTN hypertension; DM diabetes mellitus; COPD chronic obstructive pulmonary disease

## 2021-12-07 NOTE — Assessment & Plan Note (Addendum)
Stable overall, intraretinal fluid much less but still chronic active temporal and superotemporal to FAZ.  Controlled currently on Eylea q. 9 weeks.  Repeat injection today and follow-up again in 9 weeks or earlier if patient develops a conflict in time

## 2021-12-12 ENCOUNTER — Telehealth: Payer: Self-pay | Admitting: *Deleted

## 2021-12-12 NOTE — Telephone Encounter (Signed)
Returned patient's phone call, spoke with patient 

## 2021-12-20 ENCOUNTER — Other Ambulatory Visit: Payer: Self-pay

## 2021-12-20 ENCOUNTER — Encounter (HOSPITAL_BASED_OUTPATIENT_CLINIC_OR_DEPARTMENT_OTHER): Payer: Self-pay | Admitting: Urology

## 2021-12-20 NOTE — Progress Notes (Signed)
Spoke w/ via phone for pre-op interview---pt Lab needs dos----    none           Lab results------cbc bmp done 12-05-2021 epic COVID test -----patient states asymptomatic no test needed Arrive at -------830 am 12-26-2021 NPO after MN NO Solid Food.  Clear liquids from MN until---730 am Med rec completed Medications to take morning of surgery -----rapamune, orgobyx Diabetic medication -----n/a Patient instructed no nail polish to be worn day of surgery Patient instructed to bring photo id and insurance card day of surgery Patient aware to have Driver (ride ) / caregiver   wife Jordan Hanna will drop pt off  for 24 hours after surgery  Patient Special Instructions -----fleets enema am of surgery Pre-Op special Istructions -----pt aware to stop eliquis 2 days before suery last dose to be 12-23-2021, hematology note to stop eliquis 2 days before surgery dr a Lianne Cure 11-17-2021 on chart for 12-26-2021 surgery Patient verbalized understanding of instructions that were given at this phone interview. Patient denies shortness of breath, chest pain, fever, cough at this phone interview.   Lov transplant dr j Wayna Chalet 12-04-2021 epic Lov hematology dr a Corrie Mckusick 12-05-2021 epic Ekg 06-06-2021 chart/epic

## 2021-12-26 ENCOUNTER — Encounter (HOSPITAL_BASED_OUTPATIENT_CLINIC_OR_DEPARTMENT_OTHER): Payer: Self-pay | Admitting: Urology

## 2021-12-26 ENCOUNTER — Telehealth: Payer: Self-pay | Admitting: *Deleted

## 2021-12-26 ENCOUNTER — Ambulatory Visit (HOSPITAL_BASED_OUTPATIENT_CLINIC_OR_DEPARTMENT_OTHER)
Admission: RE | Admit: 2021-12-26 | Discharge: 2021-12-26 | Disposition: A | Discharge status: Home / Self Care | Payer: Commercial Managed Care - PPO | Attending: Urology | Admitting: Urology

## 2021-12-26 ENCOUNTER — Ambulatory Visit (HOSPITAL_BASED_OUTPATIENT_CLINIC_OR_DEPARTMENT_OTHER): Payer: Commercial Managed Care - PPO | Admitting: Anesthesiology

## 2021-12-26 ENCOUNTER — Encounter (HOSPITAL_BASED_OUTPATIENT_CLINIC_OR_DEPARTMENT_OTHER)
Admission: RE | Disposition: A | Discharge status: Home / Self Care | Payer: Self-pay | Source: Home / Self Care | Attending: Urology

## 2021-12-26 DIAGNOSIS — I10 Essential (primary) hypertension: Secondary | ICD-10-CM | POA: Diagnosis not present

## 2021-12-26 DIAGNOSIS — N189 Chronic kidney disease, unspecified: Secondary | ICD-10-CM | POA: Diagnosis not present

## 2021-12-26 DIAGNOSIS — C61 Malignant neoplasm of prostate: Secondary | ICD-10-CM | POA: Diagnosis present

## 2021-12-26 DIAGNOSIS — Z86718 Personal history of other venous thrombosis and embolism: Secondary | ICD-10-CM | POA: Insufficient documentation

## 2021-12-26 DIAGNOSIS — M329 Systemic lupus erythematosus, unspecified: Secondary | ICD-10-CM | POA: Diagnosis not present

## 2021-12-26 DIAGNOSIS — Z7901 Long term (current) use of anticoagulants: Secondary | ICD-10-CM | POA: Insufficient documentation

## 2021-12-26 DIAGNOSIS — I129 Hypertensive chronic kidney disease with stage 1 through stage 4 chronic kidney disease, or unspecified chronic kidney disease: Secondary | ICD-10-CM | POA: Insufficient documentation

## 2021-12-26 DIAGNOSIS — Z940 Kidney transplant status: Secondary | ICD-10-CM | POA: Insufficient documentation

## 2021-12-26 DIAGNOSIS — I82409 Acute embolism and thrombosis of unspecified deep veins of unspecified lower extremity: Secondary | ICD-10-CM

## 2021-12-26 DIAGNOSIS — H3500 Unspecified background retinopathy: Secondary | ICD-10-CM | POA: Insufficient documentation

## 2021-12-26 DIAGNOSIS — Z01818 Encounter for other preprocedural examination: Secondary | ICD-10-CM

## 2021-12-26 DIAGNOSIS — N289 Disorder of kidney and ureter, unspecified: Secondary | ICD-10-CM | POA: Diagnosis not present

## 2021-12-26 HISTORY — DX: Systemic lupus erythematosus, unspecified: M32.9

## 2021-12-26 HISTORY — DX: Malignant neoplasm of prostate: C61

## 2021-12-26 HISTORY — PX: SPACE OAR INSTILLATION: SHX6769

## 2021-12-26 HISTORY — PX: GOLD SEED IMPLANT: SHX6343

## 2021-12-26 HISTORY — DX: Reserved for concepts with insufficient information to code with codable children: IMO0002

## 2021-12-26 SURGERY — INSERTION, GOLD SEEDS
Anesthesia: Monitor Anesthesia Care | Site: Prostate

## 2021-12-26 MED ORDER — CEFAZOLIN SODIUM-DEXTROSE 2-4 GM/100ML-% IV SOLN
INTRAVENOUS | Status: AC
  Filled 2021-12-26: qty 100

## 2021-12-26 MED ORDER — AMISULPRIDE (ANTIEMETIC) 5 MG/2ML IV SOLN
10.0000 mg | Freq: Once | INTRAVENOUS | Status: DC | PRN
Start: 2021-12-26 — End: 2021-12-26

## 2021-12-26 MED ORDER — SODIUM CHLORIDE 0.9 % IV SOLN: INTRAVENOUS | Status: DC

## 2021-12-26 MED ORDER — ONDANSETRON HCL 4 MG/2ML IJ SOLN
INTRAMUSCULAR | Status: AC
  Filled 2021-12-26: qty 2

## 2021-12-26 MED ORDER — FLEET ENEMA 7-19 GM/118ML RE ENEM: 1.0000 | ENEMA | Freq: Once | RECTAL | Status: DC

## 2021-12-26 MED ORDER — OXYCODONE HCL 5 MG PO TABS: 5.0000 mg | ORAL_TABLET | Freq: Once | ORAL | Status: DC | PRN

## 2021-12-26 MED ORDER — OXYCODONE HCL 5 MG/5ML PO SOLN: 5.0000 mg | Freq: Once | ORAL | Status: DC | PRN

## 2021-12-26 MED ORDER — PROPOFOL 500 MG/50ML IV EMUL
INTRAVENOUS | Status: DC | PRN
  Administered 2021-12-26: 50 ug/kg/min via INTRAVENOUS

## 2021-12-26 MED ORDER — MIDAZOLAM HCL 5 MG/5ML IJ SOLN
INTRAMUSCULAR | Status: DC | PRN
  Administered 2021-12-26 (×2): 1 mg via INTRAVENOUS

## 2021-12-26 MED ORDER — MIDAZOLAM HCL 2 MG/2ML IJ SOLN
INTRAMUSCULAR | Status: AC
  Filled 2021-12-26: qty 2

## 2021-12-26 MED ORDER — LIDOCAINE HCL (PF) 2 % IJ SOLN
INTRAMUSCULAR | Status: AC
  Filled 2021-12-26: qty 5

## 2021-12-26 MED ORDER — LIDOCAINE HCL (CARDIAC) PF 100 MG/5ML IV SOSY
PREFILLED_SYRINGE | INTRAVENOUS | Status: DC | PRN
  Administered 2021-12-26: 60 mg via INTRAVENOUS

## 2021-12-26 MED ORDER — LIDOCAINE HCL 1 % IJ SOLN
INTRAMUSCULAR | Status: DC | PRN
  Administered 2021-12-26: 6 mL

## 2021-12-26 MED ORDER — DEXMEDETOMIDINE (PRECEDEX) IN NS 20 MCG/5ML (4 MCG/ML) IV SYRINGE
PREFILLED_SYRINGE | INTRAVENOUS | Status: DC | PRN
  Administered 2021-12-26: 4 ug via INTRAVENOUS

## 2021-12-26 MED ORDER — ACETAMINOPHEN 500 MG PO TABS
1000.0000 mg | ORAL_TABLET | Freq: Once | ORAL | Status: AC
Start: 2021-12-26 — End: 2021-12-26
  Administered 2021-12-26: 1000 mg via ORAL

## 2021-12-26 MED ORDER — ACETAMINOPHEN 500 MG PO TABS
ORAL_TABLET | ORAL | Status: AC
  Filled 2021-12-26: qty 2

## 2021-12-26 MED ORDER — ONDANSETRON HCL 4 MG/2ML IJ SOLN
INTRAMUSCULAR | Status: DC | PRN
  Administered 2021-12-26: 4 mg via INTRAVENOUS

## 2021-12-26 MED ORDER — CEFAZOLIN SODIUM-DEXTROSE 2-4 GM/100ML-% IV SOLN
2.0000 g | Freq: Once | INTRAVENOUS | Status: AC
  Administered 2021-12-26: 2 g via INTRAVENOUS

## 2021-12-26 MED ORDER — ELIQUIS 5 MG PO TABS: 5.0000 mg | ORAL_TABLET | Freq: Two times a day (BID) | ORAL | Status: DC

## 2021-12-26 MED ORDER — ONDANSETRON HCL 4 MG/2ML IJ SOLN: 4.0000 mg | Freq: Once | INTRAMUSCULAR | Status: DC | PRN

## 2021-12-26 MED ORDER — SODIUM CHLORIDE (PF) 0.9 % IJ SOLN
INTRAMUSCULAR | Status: DC | PRN
  Administered 2021-12-26: 10 mL

## 2021-12-26 MED ORDER — FENTANYL CITRATE (PF) 100 MCG/2ML IJ SOLN: 25.0000 ug | INTRAMUSCULAR | Status: DC | PRN

## 2021-12-26 MED ORDER — DEXMEDETOMIDINE (PRECEDEX) IN NS 20 MCG/5ML (4 MCG/ML) IV SYRINGE
PREFILLED_SYRINGE | INTRAVENOUS | Status: AC
  Filled 2021-12-26: qty 5

## 2021-12-26 MED ORDER — PROPOFOL 1000 MG/100ML IV EMUL
INTRAVENOUS | Status: AC
  Filled 2021-12-26: qty 100

## 2021-12-26 SURGICAL SUPPLY — 27 items
BLADE CLIPPER SENSICLIP SURGIC (BLADE) ×2 IMPLANT
CNTNR URN SCR LID CUP LEK RST (MISCELLANEOUS) ×1 IMPLANT
CONT SPEC 4OZ STRL OR WHT (MISCELLANEOUS) ×2
COVER BACK TABLE 60X90IN (DRAPES) ×2 IMPLANT
DRSG IV TEGADERM 3.5X4.5 STRL (GAUZE/BANDAGES/DRESSINGS) ×1 IMPLANT
DRSG TEGADERM 4X4.75 (GAUZE/BANDAGES/DRESSINGS) ×2 IMPLANT
DRSG TEGADERM 8X12 (GAUZE/BANDAGES/DRESSINGS) ×2 IMPLANT
GAUZE SPONGE 4X4 12PLY STRL (GAUZE/BANDAGES/DRESSINGS) ×2 IMPLANT
GLOVE BIO SURGEON STRL SZ7.5 (GLOVE) ×2 IMPLANT
GLOVE BIO SURGEON STRL SZ8 (GLOVE) IMPLANT
GLOVE SURG ORTHO 8.5 STRL (GLOVE) ×2 IMPLANT
IMPL SPACEOAR VUE SYSTEM (Spacer) ×1 IMPLANT
IMPLANT SPACEOAR VUE SYSTEM (Spacer) ×2 IMPLANT
KIT TURNOVER CYSTO (KITS) ×2 IMPLANT
MARKER GOLD PRELOAD 1.2X3 (Urological Implant) ×1 IMPLANT
MARKER SKIN DUAL TIP RULER LAB (MISCELLANEOUS) ×2 IMPLANT
NDL SPNL 22GX3.5 QUINCKE BK (NEEDLE) ×1 IMPLANT
NEEDLE SPNL 22GX3.5 QUINCKE BK (NEEDLE) ×2 IMPLANT
PREP POVIDONE IODINE SPRAY 2OZ (MISCELLANEOUS) ×1 IMPLANT
SEED GOLD PRELOAD 1.2X3 (Urological Implant) ×2 IMPLANT
SHEATH ULTRASOUND LF (SHEATH) IMPLANT
SHEATH ULTRASOUND LTX NONSTRL (SHEATH) ×1 IMPLANT
SURGILUBE 2OZ TUBE FLIPTOP (MISCELLANEOUS) ×2 IMPLANT
SYR 10ML LL (SYRINGE) ×1 IMPLANT
SYR CONTROL 10ML LL (SYRINGE) ×2 IMPLANT
TOWEL OR 17X26 10 PK STRL BLUE (TOWEL DISPOSABLE) ×2 IMPLANT
UNDERPAD 30X36 HEAVY ABSORB (UNDERPADS AND DIAPERS) ×2 IMPLANT

## 2021-12-26 NOTE — Discharge Instructions (Addendum)
Transrectal Ultrasound-Guided Prostate Gold Seed and Biodegradable Gel Placement, Care After  The following information offers guidance on how to care for yourself after your procedure. Your health care provider may also give you more specific instructions. If you have problems or questions, contact your health care provider. What can I expect after the procedure? After the procedure, it is common to have: Light bleeding from the rectum. Bruising or tenderness in the area behind the scrotum (perineum), if the needle was put into your prostate through this area. Small amounts of blood in your urine. This should only last for a few days. Light brown or red semen. This may last for a couple of weeks. Follow these instructions at home: Medicines A prescription pill bottle with an example of a pill.  Take over-the-counter and prescription medicines only as told by your health care provider. If you were prescribed an antibiotic, take it as told by your health care provider. Do not stop taking the antibiotic early, even if you start to feel better. Ask your health care provider if the medicine prescribed to you requires you to avoid driving or using machinery. Eating and drinking Follow instructions from your health care provider about eating or drinking restrictions. Drink enough fluid to keep your urine pale yellow. Managing pain and swelling If directed, put ice on the affected area. To do this: Put ice in a plastic bag. Place a towel between your skin and the bag. Leave the ice on for 20 minutes, 2-3 times a day. Be sure to remove the ice if your skin turns bright red. If you cannot feel pain, heat, or cold, you have a greater risk of damage to the area. Try not to sit directly on the area behind the scrotum. A soft cushion can help with discomfort. Activity If you were given a sedative during the procedure, it can affect you for several hours. Do not drive or operate machinery until your  health care provider says that it is safe. Return to your normal activities as told by your health care provider. Ask your health care provider what activities are safe for you. Follow instructions from your health care provider about when it is safe for you to engage in sexual activity. General instructions Plan to have a responsible adult care for you for the time you are told after you leave the hospital or clinic. Do not take baths, swim, or use a hot tub until your health care provider approves. Ask your health care provider if you may take showers. You may only be allowed to take sponge baths. Keep all follow-up visits. Contact a health care provider if: You have a fever or chills. You have more blood in your urine. You have blood in your urine for more than 2-3 days after the procedure. You have trouble passing urine or having a bowel movement. You have pain or burning when urinating. You have nausea or you vomit. Get help right away if: You have severe pain that does not get better with medicine. Your urine is bright red. You cannot urinate. You have rectal bleeding that gets worse. You have shortness of breath. Summary After the procedure, you may have blood in your urine and light bleeding from the rectum. Return to your normal activities as told by your health care provider. Ask your health care provider what activities are safe for you. Take over-the-counter and prescription medicines only as told by your health care provider. Contact your health care provider right away if  your urine is bright red or you cannot pass urine. This information is not intended to replace advice given to you by your health care provider. Make sure you discuss any questions you have with your health care provider. Document Revised: 10/05/2020 Document Reviewed: 10/05/2020 Elsevier Patient Education  [place] Instructions  Activity: Get plenty of rest for  the remainder of the day. A responsible individual must stay with you for 24 hours following the procedure.  For the next 24 hours, DO NOT: -Drive a car -Paediatric nurse -Drink alcoholic beverages -Take any medication unless instructed by your physician -Make any legal decisions or sign important papers.  Meals: Start with liquid foods such as gelatin or soup. Progress to regular foods as tolerated. Avoid greasy, spicy, heavy foods. If nausea and/or vomiting occur, drink only clear liquids until the nausea and/or vomiting subsides. Call your physician if vomiting continues.  Special Instructions/Symptoms: Your throat may feel dry or sore from the anesthesia or the breathing tube placed in your throat during surgery. If this causes discomfort, gargle with warm salt water. The discomfort should disappear within 24 hours.  May take Tylenol as needed for soreness/discomfort beginning at 3 PM.

## 2021-12-26 NOTE — Anesthesia Procedure Notes (Signed)
Procedure Name: MAC Date/Time: 12/26/2021 10:22 AM Performed by: Justice Rocher, CRNA Pre-anesthesia Checklist: Timeout performed, Patient being monitored, Suction available, Emergency Drugs available and Patient identified Patient Re-evaluated:Patient Re-evaluated prior to induction Oxygen Delivery Method: Simple face mask Preoxygenation: Pre-oxygenation with 100% oxygen Induction Type: IV induction Placement Confirmation: breath sounds checked- equal and bilateral, CO2 detector and positive ETCO2

## 2021-12-26 NOTE — H&P (Signed)
H&P  DIAGNOSIS: 61 y.o. gentleman with Stage T1c adenocarcinoma of the prostate with Gleason score of 4+3, and PSA of 14.9.       ICD-10-CM    1. Malignant neoplasm of prostate ([place])  C61           HISTORY OF PRESENT ILLNESS: Jordan Hanna is a 61 y.o. male with a diagnosis of prostate cancer. He was initially referred to Dr. Junious Silk in 07/2020 for BPH, renal cysts, ED, and ESRD. His PSA was 3.67 in 03/2020. Of note, he underwent kidney transplant in 09/2020 and a repeat PSA had increased to 9.3 in 03/2021.  At the time of follow up office visit, the digital rectal examination was performed revealing no nodules. PSA increased further to 12.5 in 04/2021, prompting a prostate MRI which was performed on 05/28/21 showing no radiographic evidence of high grade prostate carcinoma (PI-RADS 2). A repeat PSA in 06/2021 increased further to 14.9 so the patient proceeded to transrectal ultrasound with 12 biopsies of the prostate on 09/22/21.  The prostate volume measured 44 cc.  Out of 12 core biopsies, 8 were positive, including all left-sided cores.  The maximum Gleason score was 4+3, and this was seen in the  left mid, left base (with perineural invasion), and left base lateral. Additionally, Gleason 3+4 was seen in the left mid lateral, left apex, and right mid, and Gleason 3+3 in the right base and left apex lateral.   He underwent PSMA PET scan on 11/02/21 showing nonspecific radiotracer activity within the prostate gland but no evidence of metastatic adenopathy, visceral metastasis, or skeletal metastasis. The radiotracer activity in two anteromedial right ribs is favored related to prior rib fracture.  He presents today for transperineal placement of gold seed prostate fiducial markers and biodegradable SpaceOAR gel.  He is well today without fever, congestion, dysuria or gross hematuria.  He started Orgovyx Dec 12, 2021.  He is doing well.  No bothersome side effects.  He has noticed slightly more fat  laterally above his waist.  Past Medical History:  Diagnosis Date   Anemia in chronic kidney disease (CKD)    Blood clot in vein 12/2020   right tibial vein   Chronic kidney disease    s/p renal transplant 10-17-2020   Hypertension    Prostate cancer ([place])    Systemic Lupus [facility])    Past Surgical History:  Procedure Laterality Date   colonscopy  2022   kidney  transplant  10/17/2020   done @ [facility]   LASIK Bilateral [facility]  2022   RENAL BIOPSY  10/2020   right wrist surgery  2000    Home Medications:  Medications Prior to Admission  Medication Sig Dispense Refill Last Dose   UNABLE TO FIND Orgobyx 120 mg daily in am for prostate, per dr Omaira Mellen      Belatacept (NULOJIX IV) Inject into the vein. Takes the 12th of every month-infusion      ELIQUIS 5 MG TABS tablet Take 2.5 mg by mouth 2 (two) times daily.      eplerenone (INSPRA) 50 MG tablet Take 50 mg by mouth daily.      hydroxychloroquine (PLAQUENIL) 200 MG tablet Take 200 mg by mouth 2 (two) times daily. Takes at 1200 pm      lisinopril (ZESTRIL) 5 MG tablet Take 5 mg by mouth daily.      mirtazapine (REMERON) 30 MG tablet Take 30 mg by mouth at bedtime.  sirolimus (RAPAMUNE) 1 MG tablet Take 2 mg by mouth See admin instructions. 2 tabs daily      zolpidem (AMBIEN) 5 MG tablet Take 10 mg by mouth at bedtime as needed.      Allergies:  Allergies  Allergen Reactions   Prednisone Swelling    Swelling behind the eye that causes not being able to see    Family History  Problem Relation Age of Onset   Diabetes Mother    Hypertension Father    Diabetes Father    Social History:  reports that he has never smoked. He has never used smokeless tobacco. He reports that he does not currently use drugs. No history on file for alcohol use.  ROS: A complete review of systems was performed.  All systems are negative except for pertinent findings as noted. Review of Systems  All other systems reviewed and are  negative.   Physical Exam:  Vital signs in last 24 hours:   General:  Alert and oriented, No acute distress HEENT: Normocephalic, atraumatic Neck: No JVD or lymphadenopathy Cardiovascular: Regular rate and rhythm Lungs: Regular rate and effort Abdomen: Soft, nontender, nondistended, no abdominal masses Back: No CVA tenderness Extremities: No edema Neurologic: Grossly intact  Laboratory Data:  No results found for this or any previous visit (from the past 24 hour(s)). No results found for this or any previous visit (from the past 240 hour(s)). Creatinine: No results for input(s): CREATININE in the last 168 hours.  Impression/Assessment:  Prostate cancer  Plan:  First off we discussed the nature risk benefits and alternatives to ADT.  We discussed the advantages of Orgovyx such as quicker recovery of testosterone upon sensation of the medicine as well as lower cardiovascular risk.  We discussed cardiovascular risk and effects on sexual function, bone and muscle health among other risks.  Given his PSA greater than 10 and grade group 3 disease I would favor short-term ADT.  I drew patient a picture of the anatomy and discussed with the patient the nature, potential benefits, risks and alternatives to transperineal placement of prostate gold seed markers and SpaceOAR biodegradable gel insertion, including side effects of the proposed treatment, the likelihood of the patient achieving the goals of the procedure, and any potential problems that might occur during the procedure or recuperation.  We discussed risk of urethral, bladder and rectal injury among others.  We discussed the risk of bladder and bowel urgency and frequency among others.  All questions answered. Patient elects to proceed.   Festus Aloe 12/26/2021

## 2021-12-26 NOTE — Anesthesia Preprocedure Evaluation (Addendum)
Anesthesia Evaluation  Patient identified by MRN, date of birth, ID band Patient awake    Reviewed: Allergy & Precautions, NPO status , Patient's Chart, lab work & pertinent test results  Airway Mallampati: I  TM Distance: >3 FB Neck ROM: Full    Dental no notable dental hx.    Pulmonary neg pulmonary ROS,    Pulmonary exam normal breath sounds clear to auscultation       Cardiovascular hypertension, Pt. on medications + DVT (on Eliquis Last dose 12/22/21)  Normal cardiovascular exam Rhythm:Regular Rate:Normal     Neuro/Psych negative neurological ROS  negative psych ROS   GI/Hepatic negative GI ROS, Neg liver ROS,   Endo/Other  negative endocrine ROS  Renal/GU Renal diseaseS/p kidney transplant   Prostate cancer    Musculoskeletal negative musculoskeletal ROS (+)   Abdominal   Peds negative pediatric ROS (+)  Hematology negative hematology ROS (+)   Anesthesia Other Findings Lupus retinopathy  Reproductive/Obstetrics negative OB ROS                            Anesthesia Physical Anesthesia Plan  ASA: 3  Anesthesia Plan: MAC   Post-op Pain Management:    Induction: Intravenous  PONV Risk Score and Plan: 1 and Propofol infusion, TIVA and Treatment may vary due to age or medical condition  Airway Management Planned: Natural Airway and Simple Face Mask  Additional Equipment: None  Intra-op Plan:   Post-operative Plan: Extubation in OR  Informed Consent: I have reviewed the patients History and Physical, chart, labs and discussed the procedure including the risks, benefits and alternatives for the proposed anesthesia with the patient or authorized representative who has indicated his/her understanding and acceptance.       Plan Discussed with: CRNA, Anesthesiologist and Surgeon  Anesthesia Plan Comments:         Anesthesia Quick Evaluation

## 2021-12-26 NOTE — Transfer of Care (Signed)
Immediate Anesthesia Transfer of Care Note  Patient: Jordan Hanna  Procedure(s) Performed: Procedure(s) (LRB): GOLD SEED IMPLANT (N/A) SPACE OAR INSTILLATION (N/A)  Patient Location: PACU  Anesthesia Type: MAC  Level of Consciousness: awake, sedated, patient cooperative and responds to stimulation  Airway & Oxygen Therapy: Patient Spontanous Breathing and Patient on RA - awake   Post-op Assessment: Report given to PACU RN, Post -op Vital signs reviewed and stable and Patient moving all extremities  Post vital signs: Reviewed and stable  Complications: No apparent anesthesia complications

## 2021-12-26 NOTE — Telephone Encounter (Signed)
Called patient to inform that sim needs to be rescheduled to 02/01/22 due to him having ADT, appt. rescheduled for 02-01-22- arrival time- 1:45 pm, lvm for a return call

## 2021-12-26 NOTE — Telephone Encounter (Signed)
CALLED PATIENT TO REMIND OF SIM APPT. FOR 12-29-21- ARRIVAL TIME- 1:45 PM @ [facility], INFORMED PATIENT TO ARRIVE WITH FULL BLADDER AND AN EMPTY BOWEL, SPOKE WITH PATIENT AND HE IS AWARE OF THIS APPT. AND THE INSTRUCTIONS

## 2021-12-26 NOTE — Op Note (Addendum)
Preoperative diagnosis: Prostate cancer Postoperative diagnosis: Prostate cancer  Procedure: Transrectal ultrasound-guided transperineal placement of gold seed prostate fiducial markers and SpaceOAR biodegradable gel  Surgeon: Junious Silk  Anesthesia: General  Indication for procedure: 61 yo male who is preparing to start external beam radiation.  He presents today for the above.  Findings: Normal-appearing prostate with intact capsule and no hypoechoic. Seminal vesicles appeared normal.  Description of procedure: After consent was obtained patient brought to the operating room.  After adequate anesthesia he was placed in lithotomy position and the scrotum supported superiorly.  The perineum was prepped.  Ultrasound probe inserted rectally.  Prostate imaged in the axial and sagittal views.  The gold seed markers were passed transperineally under ultrasound guidance placing three total with one in the right base, right apex and left mid.  The 18-gauge needle was then inserted approximately 1 to 2 cm anterior to the anal opening and directed under ultrasonic guidance into the perirectal fat between the anterior rectal wall and the prostate capsule down to the mid-gland. Midline needle position was confirmed in the sagittal and axial views to verify the tip was in the perirectal fat.  Small amounts of saline were injected to hydrodissect the space between the prostate and the anterior rectal wall.  Axial imaging was viewed to confirm the needle was in the correct location in the mid gland and centered.  Aspiration confirmed no intravascular access.  The saline syringe was carefully disconnected maintaining the desired needle position and the hydrogel was attached to the needle.  Under ultrasound guidance in the sagittal view a smooth continuous injection was done over about 12 seconds delivering the hydrogel into the space between the prostate and rectal wall.  The needle was withdrawn.  A dressing was  placed and the patient was awakened and taken to the cover room in stable condition.  Complications: None  Blood loss: Minimal  Specimens: None  Drains: None  Disposition: Patient stable to PACU - I discussed with his wife Lattie Haw the procedure, postop care and follow-up.  I did let her know I sent a message to radiation oncology that his SpaceOAR and gold seeds were today.  Also went over with her the nature risk benefits and alternatives to ADT.  All questions answered.

## 2021-12-26 NOTE — Anesthesia Postprocedure Evaluation (Signed)
Anesthesia Post Note  Patient: Jordan Hanna  Procedure(s) Performed: GOLD SEED IMPLANT (Prostate) SPACE OAR INSTILLATION (Prostate)     Patient location during evaluation: PACU Anesthesia Type: MAC Level of consciousness: awake and alert Pain management: pain level controlled Vital Signs Assessment: post-procedure vital signs reviewed and stable Respiratory status: spontaneous breathing and respiratory function stable Cardiovascular status: stable Postop Assessment: no apparent nausea or vomiting Anesthetic complications: no   No notable events documented.  Last Vitals:  Vitals:   12/26/21 1115 12/26/21 1130  BP: 93/69 104/74  Pulse: (!) 53 (!) 55  Resp: 14 12  Temp:  (!) 36.4 C  SpO2: 100% 100%    Last Pain:  Vitals:   12/26/21 1130  TempSrc:   PainSc: 2                  Merlinda Frederick

## 2021-12-27 ENCOUNTER — Encounter (HOSPITAL_BASED_OUTPATIENT_CLINIC_OR_DEPARTMENT_OTHER): Payer: Self-pay | Admitting: Urology

## 2021-12-28 ENCOUNTER — Encounter: Payer: Self-pay | Admitting: Urology

## 2021-12-28 ENCOUNTER — Encounter (HOSPITAL_BASED_OUTPATIENT_CLINIC_OR_DEPARTMENT_OTHER): Payer: Self-pay | Admitting: Urology

## 2021-12-28 ENCOUNTER — Telehealth: Payer: Self-pay | Admitting: *Deleted

## 2021-12-28 NOTE — Telephone Encounter (Signed)
Called patient to inform of sim appt. for 12-29-21- arrival time- 1:15 pm @ [facility], reminded patient to arrive with a full bladder and an empty bowel, spoke with patient and he is aware of this appt. and the instructions

## 2021-12-29 ENCOUNTER — Ambulatory Visit: Payer: Medicare Other | Admitting: Radiation Oncology

## 2021-12-29 ENCOUNTER — Ambulatory Visit
Admission: RE | Admit: 2021-12-29 | Discharge: 2021-12-29 | Disposition: A | Discharge status: Home / Self Care | Payer: Commercial Managed Care - PPO | Source: Ambulatory Visit | Attending: Radiation Oncology | Admitting: Radiation Oncology

## 2021-12-29 ENCOUNTER — Encounter (HOSPITAL_COMMUNITY): Payer: Self-pay

## 2021-12-29 DIAGNOSIS — Z510 Encounter for antineoplastic radiation therapy: Secondary | ICD-10-CM | POA: Insufficient documentation

## 2021-12-29 DIAGNOSIS — C61 Malignant neoplasm of prostate: Secondary | ICD-10-CM | POA: Insufficient documentation

## 2021-12-29 NOTE — Progress Notes (Signed)
Started Orgovyx for ST-ADT on 12/12/2021 and is doing well.   OK to proceed with EBXRT, per Dr. Junious Silk. He got gold seeds and spaceoar 12/26/21 and will [facility] 12/29/21. OK to proceed with SIM and treatment per Dr. Tammi Klippel.  Nicholos Johns, MMS, PA-C [facility]: [phone]  Fax: [phone]

## 2021-12-29 NOTE — Progress Notes (Signed)
  Radiation Oncology         [phone]) [phone] ________________________________  Name: Jordan Hanna MRN: [id number]  Date: 12/29/2021  DOB: 1961-03-01  SIMULATION AND TREATMENT PLANNING NOTE    ICD-10-CM   1. Malignant neoplasm of prostate ([place])  C61       DIAGNOSIS:  61 y.o. gentleman with Stage T1c adenocarcinoma of the prostate with Gleason score of 4+3, and PSA of 14.9.  NARRATIVE:  The patient was brought to the [facility].  Identity was confirmed.  All relevant records and images related to the planned course of therapy were reviewed.  The patient freely provided informed written consent to proceed with treatment after reviewing the details related to the planned course of therapy. The consent form was witnessed and verified by the simulation staff.  Then, the patient was set-up in a stable reproducible supine position for radiation therapy.  A vacuum lock pillow device was custom fabricated to position his legs in a reproducible immobilized position.  Then, I performed a urethrogram under sterile conditions to identify the prostatic apex.  CT images were obtained.  Surface markings were placed.  The CT images were loaded into the planning software.  Then the prostate target and avoidance structures including the rectum, bladder, bowel and hips were contoured.  Treatment planning then occurred.  The radiation prescription was entered and confirmed.  A total of one complex treatment devices was fabricated. I have requested : Intensity Modulated Radiotherapy (IMRT) is medically necessary for this case for the following reason:  Rectal sparing.Marland Kitchen  PLAN:  The patient will receive 70 Gy in 28 fractions.  ________________________________  Sheral Apley Tammi Klippel, M.D.

## 2022-01-02 NOTE — Addendum Note (Signed)
Addendum  created 01/02/22 1834 by Merlinda Frederick, MD   Attestation recorded in [place], [place] filed

## 2022-01-09 DIAGNOSIS — Z510 Encounter for antineoplastic radiation therapy: Secondary | ICD-10-CM | POA: Diagnosis not present

## 2022-01-11 ENCOUNTER — Ambulatory Visit
Admission: RE | Admit: 2022-01-11 | Discharge: 2022-01-11 | Disposition: A | Discharge status: Home / Self Care | Payer: Commercial Managed Care - PPO | Source: Ambulatory Visit | Attending: Radiation Oncology | Admitting: Radiation Oncology

## 2022-01-11 ENCOUNTER — Other Ambulatory Visit: Payer: Self-pay

## 2022-01-11 DIAGNOSIS — Z510 Encounter for antineoplastic radiation therapy: Secondary | ICD-10-CM | POA: Diagnosis not present

## 2022-01-11 LAB — RAD ONC ARIA SESSION SUMMARY
Course Elapsed Days: 0
Plan Fractions Treated to Date: 1
Plan Prescribed Dose Per Fraction: 2.5 Gy
Plan Total Fractions Prescribed: 28
Plan Total Prescribed Dose: 70 Gy
Reference Point Dosage Given to Date: 2.5 Gy
Reference Point Session Dosage Given: 2.5 Gy
Session Number: 1

## 2022-01-12 ENCOUNTER — Other Ambulatory Visit: Payer: Self-pay

## 2022-01-12 ENCOUNTER — Ambulatory Visit
Admission: RE | Admit: 2022-01-12 | Discharge: 2022-01-12 | Disposition: A | Discharge status: Home / Self Care | Payer: Commercial Managed Care - PPO | Source: Ambulatory Visit | Attending: Radiation Oncology | Admitting: Radiation Oncology

## 2022-01-12 DIAGNOSIS — Z510 Encounter for antineoplastic radiation therapy: Secondary | ICD-10-CM | POA: Diagnosis not present

## 2022-01-12 LAB — RAD ONC ARIA SESSION SUMMARY
Course Elapsed Days: 1
Plan Fractions Treated to Date: 2
Plan Prescribed Dose Per Fraction: 2.5 Gy
Plan Total Fractions Prescribed: 28
Plan Total Prescribed Dose: 70 Gy
Reference Point Dosage Given to Date: 5 Gy
Reference Point Session Dosage Given: 2.5 Gy
Session Number: 2

## 2022-01-15 ENCOUNTER — Ambulatory Visit
Admission: RE | Admit: 2022-01-15 | Discharge: 2022-01-15 | Disposition: A | Discharge status: Home / Self Care | Payer: Commercial Managed Care - PPO | Source: Ambulatory Visit | Attending: Radiation Oncology | Admitting: Radiation Oncology

## 2022-01-15 ENCOUNTER — Other Ambulatory Visit: Payer: Self-pay

## 2022-01-15 DIAGNOSIS — Z510 Encounter for antineoplastic radiation therapy: Secondary | ICD-10-CM | POA: Diagnosis not present

## 2022-01-15 LAB — RAD ONC ARIA SESSION SUMMARY
Course Elapsed Days: 4
Plan Fractions Treated to Date: 3
Plan Prescribed Dose Per Fraction: 2.5 Gy
Plan Total Fractions Prescribed: 28
Plan Total Prescribed Dose: 70 Gy
Reference Point Dosage Given to Date: 7.5 Gy
Reference Point Session Dosage Given: 2.5 Gy
Session Number: 3

## 2022-01-16 ENCOUNTER — Other Ambulatory Visit: Payer: Self-pay

## 2022-01-16 ENCOUNTER — Ambulatory Visit
Admission: RE | Admit: 2022-01-16 | Discharge: 2022-01-16 | Disposition: A | Discharge status: Home / Self Care | Payer: Commercial Managed Care - PPO | Source: Ambulatory Visit | Attending: Radiation Oncology | Admitting: Radiation Oncology

## 2022-01-16 DIAGNOSIS — Z510 Encounter for antineoplastic radiation therapy: Secondary | ICD-10-CM | POA: Diagnosis not present

## 2022-01-16 LAB — RAD ONC ARIA SESSION SUMMARY
Course Elapsed Days: 5
Plan Fractions Treated to Date: 4
Plan Prescribed Dose Per Fraction: 2.5 Gy
Plan Total Fractions Prescribed: 28
Plan Total Prescribed Dose: 70 Gy
Reference Point Dosage Given to Date: 10 Gy
Reference Point Session Dosage Given: 2.5 Gy
Session Number: 4

## 2022-01-17 ENCOUNTER — Other Ambulatory Visit: Payer: Self-pay

## 2022-01-17 ENCOUNTER — Ambulatory Visit
Admission: RE | Admit: 2022-01-17 | Discharge: 2022-01-17 | Disposition: A | Discharge status: Home / Self Care | Payer: Commercial Managed Care - PPO | Source: Ambulatory Visit | Attending: Radiation Oncology | Admitting: Radiation Oncology

## 2022-01-17 DIAGNOSIS — Z510 Encounter for antineoplastic radiation therapy: Secondary | ICD-10-CM | POA: Diagnosis not present

## 2022-01-17 LAB — RAD ONC ARIA SESSION SUMMARY
Course Elapsed Days: 6
Plan Fractions Treated to Date: 5
Plan Prescribed Dose Per Fraction: 2.5 Gy
Plan Total Fractions Prescribed: 28
Plan Total Prescribed Dose: 70 Gy
Reference Point Dosage Given to Date: 12.5 Gy
Reference Point Session Dosage Given: 2.5 Gy
Session Number: 5

## 2022-01-18 ENCOUNTER — Ambulatory Visit
Admission: RE | Admit: 2022-01-18 | Discharge: 2022-01-18 | Disposition: A | Discharge status: Home / Self Care | Payer: Commercial Managed Care - PPO | Source: Ambulatory Visit | Attending: Radiation Oncology | Admitting: Radiation Oncology

## 2022-01-18 ENCOUNTER — Other Ambulatory Visit: Payer: Self-pay

## 2022-01-18 DIAGNOSIS — Z510 Encounter for antineoplastic radiation therapy: Secondary | ICD-10-CM | POA: Diagnosis not present

## 2022-01-18 LAB — RAD ONC ARIA SESSION SUMMARY
Course Elapsed Days: 7
Plan Fractions Treated to Date: 6
Plan Prescribed Dose Per Fraction: 2.5 Gy
Plan Total Fractions Prescribed: 28
Plan Total Prescribed Dose: 70 Gy
Reference Point Dosage Given to Date: 15 Gy
Reference Point Session Dosage Given: 2.5 Gy
Session Number: 6

## 2022-01-19 ENCOUNTER — Ambulatory Visit
Admission: RE | Admit: 2022-01-19 | Discharge: 2022-01-19 | Disposition: A | Discharge status: Home / Self Care | Payer: Commercial Managed Care - PPO | Source: Ambulatory Visit | Attending: Radiation Oncology | Admitting: Radiation Oncology

## 2022-01-19 ENCOUNTER — Other Ambulatory Visit: Payer: Self-pay

## 2022-01-19 DIAGNOSIS — Z510 Encounter for antineoplastic radiation therapy: Secondary | ICD-10-CM | POA: Diagnosis not present

## 2022-01-19 LAB — RAD ONC ARIA SESSION SUMMARY
Course Elapsed Days: 8
Plan Fractions Treated to Date: 7
Plan Prescribed Dose Per Fraction: 2.5 Gy
Plan Total Fractions Prescribed: 28
Plan Total Prescribed Dose: 70 Gy
Reference Point Dosage Given to Date: 17.5 Gy
Reference Point Session Dosage Given: 2.5 Gy
Session Number: 7

## 2022-01-22 ENCOUNTER — Ambulatory Visit: Payer: Commercial Managed Care - PPO

## 2022-01-24 ENCOUNTER — Other Ambulatory Visit: Payer: Self-pay

## 2022-01-24 ENCOUNTER — Ambulatory Visit
Admission: RE | Admit: 2022-01-24 | Discharge: 2022-01-24 | Disposition: A | Discharge status: Home / Self Care | Payer: Commercial Managed Care - PPO | Source: Ambulatory Visit | Attending: Radiation Oncology | Admitting: Radiation Oncology

## 2022-01-24 DIAGNOSIS — C61 Malignant neoplasm of prostate: Secondary | ICD-10-CM | POA: Diagnosis present

## 2022-01-24 DIAGNOSIS — Z510 Encounter for antineoplastic radiation therapy: Secondary | ICD-10-CM | POA: Diagnosis not present

## 2022-01-24 LAB — RAD ONC ARIA SESSION SUMMARY
Course Elapsed Days: 13
Plan Fractions Treated to Date: 8
Plan Prescribed Dose Per Fraction: 2.5 Gy
Plan Total Fractions Prescribed: 28
Plan Total Prescribed Dose: 70 Gy
Reference Point Dosage Given to Date: 20 Gy
Reference Point Session Dosage Given: 2.5 Gy
Session Number: 8

## 2022-01-25 ENCOUNTER — Other Ambulatory Visit: Payer: Self-pay

## 2022-01-25 ENCOUNTER — Ambulatory Visit
Admission: RE | Admit: 2022-01-25 | Discharge: 2022-01-25 | Disposition: A | Discharge status: Home / Self Care | Payer: Commercial Managed Care - PPO | Source: Ambulatory Visit | Attending: Radiation Oncology | Admitting: Radiation Oncology

## 2022-01-25 DIAGNOSIS — Z510 Encounter for antineoplastic radiation therapy: Secondary | ICD-10-CM | POA: Diagnosis not present

## 2022-01-25 LAB — RAD ONC ARIA SESSION SUMMARY
Course Elapsed Days: 14
Plan Fractions Treated to Date: 9
Plan Prescribed Dose Per Fraction: 2.5 Gy
Plan Total Fractions Prescribed: 28
Plan Total Prescribed Dose: 70 Gy
Reference Point Dosage Given to Date: 22.5 Gy
Reference Point Session Dosage Given: 2.5 Gy
Session Number: 9

## 2022-01-26 ENCOUNTER — Ambulatory Visit
Admission: RE | Admit: 2022-01-26 | Discharge: 2022-01-26 | Disposition: A | Discharge status: Home / Self Care | Payer: Commercial Managed Care - PPO | Source: Ambulatory Visit | Attending: Radiation Oncology | Admitting: Radiation Oncology

## 2022-01-26 ENCOUNTER — Other Ambulatory Visit: Payer: Self-pay

## 2022-01-26 DIAGNOSIS — Z510 Encounter for antineoplastic radiation therapy: Secondary | ICD-10-CM | POA: Diagnosis not present

## 2022-01-26 LAB — RAD ONC ARIA SESSION SUMMARY
Course Elapsed Days: 15
Plan Fractions Treated to Date: 10
Plan Prescribed Dose Per Fraction: 2.5 Gy
Plan Total Fractions Prescribed: 28
Plan Total Prescribed Dose: 70 Gy
Reference Point Dosage Given to Date: 25 Gy
Reference Point Session Dosage Given: 2.5 Gy
Session Number: 10

## 2022-01-29 ENCOUNTER — Ambulatory Visit
Admission: RE | Admit: 2022-01-29 | Discharge: 2022-01-29 | Disposition: A | Discharge status: Home / Self Care | Payer: Commercial Managed Care - PPO | Source: Ambulatory Visit | Attending: Radiation Oncology | Admitting: Radiation Oncology

## 2022-01-29 ENCOUNTER — Other Ambulatory Visit: Payer: Self-pay

## 2022-01-29 DIAGNOSIS — Z510 Encounter for antineoplastic radiation therapy: Secondary | ICD-10-CM | POA: Diagnosis not present

## 2022-01-29 LAB — RAD ONC ARIA SESSION SUMMARY
Course Elapsed Days: 18
Plan Fractions Treated to Date: 11
Plan Prescribed Dose Per Fraction: 2.5 Gy
Plan Total Fractions Prescribed: 28
Plan Total Prescribed Dose: 70 Gy
Reference Point Dosage Given to Date: 27.5 Gy
Reference Point Session Dosage Given: 2.5 Gy
Session Number: 11

## 2022-01-30 ENCOUNTER — Ambulatory Visit
Admission: RE | Admit: 2022-01-30 | Discharge: 2022-01-30 | Disposition: A | Discharge status: Home / Self Care | Payer: Commercial Managed Care - PPO | Source: Ambulatory Visit | Attending: Radiation Oncology | Admitting: Radiation Oncology

## 2022-01-30 ENCOUNTER — Other Ambulatory Visit: Payer: Self-pay

## 2022-01-30 DIAGNOSIS — Z510 Encounter for antineoplastic radiation therapy: Secondary | ICD-10-CM | POA: Diagnosis not present

## 2022-01-30 LAB — RAD ONC ARIA SESSION SUMMARY
Course Elapsed Days: 19
Plan Fractions Treated to Date: 12
Plan Prescribed Dose Per Fraction: 2.5 Gy
Plan Total Fractions Prescribed: 28
Plan Total Prescribed Dose: 70 Gy
Reference Point Dosage Given to Date: 30 Gy
Reference Point Session Dosage Given: 2.5 Gy
Session Number: 12

## 2022-01-31 ENCOUNTER — Ambulatory Visit
Admission: RE | Admit: 2022-01-31 | Discharge: 2022-01-31 | Disposition: A | Discharge status: Home / Self Care | Payer: Commercial Managed Care - PPO | Source: Ambulatory Visit | Attending: Radiation Oncology | Admitting: Radiation Oncology

## 2022-01-31 ENCOUNTER — Other Ambulatory Visit: Payer: Self-pay

## 2022-01-31 DIAGNOSIS — Z510 Encounter for antineoplastic radiation therapy: Secondary | ICD-10-CM | POA: Diagnosis not present

## 2022-01-31 LAB — RAD ONC ARIA SESSION SUMMARY
Course Elapsed Days: 20
Plan Fractions Treated to Date: 13
Plan Prescribed Dose Per Fraction: 2.5 Gy
Plan Total Fractions Prescribed: 28
Plan Total Prescribed Dose: 70 Gy
Reference Point Dosage Given to Date: 32.5 Gy
Reference Point Session Dosage Given: 2.5 Gy
Session Number: 13

## 2022-02-01 ENCOUNTER — Other Ambulatory Visit: Payer: Self-pay

## 2022-02-01 ENCOUNTER — Ambulatory Visit
Admission: RE | Admit: 2022-02-01 | Discharge: 2022-02-01 | Disposition: A | Discharge status: Home / Self Care | Payer: Commercial Managed Care - PPO | Source: Ambulatory Visit | Attending: Radiation Oncology | Admitting: Radiation Oncology

## 2022-02-01 ENCOUNTER — Ambulatory Visit: Payer: Commercial Managed Care - PPO | Admitting: Radiation Oncology

## 2022-02-01 DIAGNOSIS — Z510 Encounter for antineoplastic radiation therapy: Secondary | ICD-10-CM | POA: Diagnosis not present

## 2022-02-01 LAB — RAD ONC ARIA SESSION SUMMARY
Course Elapsed Days: 21
Plan Fractions Treated to Date: 14
Plan Prescribed Dose Per Fraction: 2.5 Gy
Plan Total Fractions Prescribed: 28
Plan Total Prescribed Dose: 70 Gy
Reference Point Dosage Given to Date: 35 Gy
Reference Point Session Dosage Given: 2.5 Gy
Session Number: 14

## 2022-02-02 ENCOUNTER — Ambulatory Visit
Admission: RE | Admit: 2022-02-02 | Discharge: 2022-02-02 | Disposition: A | Discharge status: Home / Self Care | Payer: Commercial Managed Care - PPO | Source: Ambulatory Visit | Attending: Radiation Oncology | Admitting: Radiation Oncology

## 2022-02-02 ENCOUNTER — Other Ambulatory Visit: Payer: Self-pay

## 2022-02-02 ENCOUNTER — Ambulatory Visit: Payer: Commercial Managed Care - PPO

## 2022-02-02 DIAGNOSIS — Z510 Encounter for antineoplastic radiation therapy: Secondary | ICD-10-CM | POA: Diagnosis not present

## 2022-02-02 LAB — RAD ONC ARIA SESSION SUMMARY
Course Elapsed Days: 22
Plan Fractions Treated to Date: 15
Plan Prescribed Dose Per Fraction: 2.5 Gy
Plan Total Fractions Prescribed: 28
Plan Total Prescribed Dose: 70 Gy
Reference Point Dosage Given to Date: 37.5 Gy
Reference Point Session Dosage Given: 2.5 Gy
Session Number: 15

## 2022-02-05 ENCOUNTER — Ambulatory Visit: Payer: Commercial Managed Care - PPO

## 2022-02-06 ENCOUNTER — Other Ambulatory Visit: Payer: Self-pay

## 2022-02-06 ENCOUNTER — Ambulatory Visit
Admission: RE | Admit: 2022-02-06 | Discharge: 2022-02-06 | Disposition: A | Discharge status: Home / Self Care | Payer: Commercial Managed Care - PPO | Source: Ambulatory Visit | Attending: Radiation Oncology | Admitting: Radiation Oncology

## 2022-02-06 DIAGNOSIS — Z510 Encounter for antineoplastic radiation therapy: Secondary | ICD-10-CM | POA: Diagnosis not present

## 2022-02-06 LAB — RAD ONC ARIA SESSION SUMMARY
Course Elapsed Days: 26
Plan Fractions Treated to Date: 16
Plan Prescribed Dose Per Fraction: 2.5 Gy
Plan Total Fractions Prescribed: 28
Plan Total Prescribed Dose: 70 Gy
Reference Point Dosage Given to Date: 40 Gy
Reference Point Session Dosage Given: 2.5 Gy
Session Number: 16

## 2022-02-07 ENCOUNTER — Ambulatory Visit
Admission: RE | Admit: 2022-02-07 | Discharge: 2022-02-07 | Disposition: A | Discharge status: Home / Self Care | Payer: Commercial Managed Care - PPO | Source: Ambulatory Visit | Attending: Radiation Oncology | Admitting: Radiation Oncology

## 2022-02-07 ENCOUNTER — Other Ambulatory Visit: Payer: Self-pay

## 2022-02-07 DIAGNOSIS — Z510 Encounter for antineoplastic radiation therapy: Secondary | ICD-10-CM | POA: Diagnosis not present

## 2022-02-07 LAB — RAD ONC ARIA SESSION SUMMARY
Course Elapsed Days: 27
Plan Fractions Treated to Date: 17
Plan Prescribed Dose Per Fraction: 2.5 Gy
Plan Total Fractions Prescribed: 28
Plan Total Prescribed Dose: 70 Gy
Reference Point Dosage Given to Date: 42.5 Gy
Reference Point Session Dosage Given: 2.5 Gy
Session Number: 17

## 2022-02-08 ENCOUNTER — Encounter (INDEPENDENT_AMBULATORY_CARE_PROVIDER_SITE_OTHER): Payer: Commercial Managed Care - PPO | Admitting: Ophthalmology

## 2022-02-08 ENCOUNTER — Ambulatory Visit (INDEPENDENT_AMBULATORY_CARE_PROVIDER_SITE_OTHER): Payer: Commercial Managed Care - PPO | Admitting: Ophthalmology

## 2022-02-08 ENCOUNTER — Ambulatory Visit
Admission: RE | Admit: 2022-02-08 | Discharge: 2022-02-08 | Disposition: A | Discharge status: Home / Self Care | Payer: Commercial Managed Care - PPO | Source: Ambulatory Visit | Attending: Radiation Oncology | Admitting: Radiation Oncology

## 2022-02-08 ENCOUNTER — Other Ambulatory Visit: Payer: Self-pay

## 2022-02-08 ENCOUNTER — Encounter (INDEPENDENT_AMBULATORY_CARE_PROVIDER_SITE_OTHER): Payer: Self-pay | Admitting: Ophthalmology

## 2022-02-08 DIAGNOSIS — H35712 Central serous chorioretinopathy, left eye: Secondary | ICD-10-CM | POA: Diagnosis not present

## 2022-02-08 DIAGNOSIS — Z510 Encounter for antineoplastic radiation therapy: Secondary | ICD-10-CM | POA: Diagnosis not present

## 2022-02-08 DIAGNOSIS — H353211 Exudative age-related macular degeneration, right eye, with active choroidal neovascularization: Secondary | ICD-10-CM

## 2022-02-08 LAB — RAD ONC ARIA SESSION SUMMARY
Course Elapsed Days: 28
Plan Fractions Treated to Date: 18
Plan Prescribed Dose Per Fraction: 2.5 Gy
Plan Total Fractions Prescribed: 28
Plan Total Prescribed Dose: 70 Gy
Reference Point Dosage Given to Date: 45 Gy
Reference Point Session Dosage Given: 2.5 Gy
Session Number: 18

## 2022-02-08 MED ORDER — AFLIBERCEPT 2MG/0.05ML IZ SOLN FOR KALEIDOSCOPE
2.0000 mg | INTRAVITREAL | Status: AC | PRN
  Administered 2022-02-08: 2 mg via INTRAVITREAL

## 2022-02-08 NOTE — Progress Notes (Signed)
02/08/2022     CHIEF COMPLAINT Patient presents for  Chief Complaint  Patient presents with   Macular Degeneration      HISTORY OF PRESENT ILLNESS: Jordan Hanna is a 61 y.o. 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  Also history of CS CR OS. Pt states his vision has been stable  Pt denies any new floaters or FOL Last edited by Hurman Horn, MD on 02/08/2022  4:13 PM.      Referring physician: Fabian November., MD [place]  [place],  [place] [address]  HISTORICAL INFORMATION:   Selected notes from the MEDICAL RECORD NUMBER       CURRENT MEDICATIONS: No current outpatient medications on file. (Ophthalmic Drugs)   No current facility-administered medications for this visit. (Ophthalmic Drugs)   Current Outpatient Medications (Other)  Medication Sig   Belatacept (NULOJIX IV) Inject into the vein. Takes the 12th of every month-infusion   ELIQUIS 5 MG TABS tablet Take 1 tablet (5 mg total) by mouth 2 (two) times daily. Take 1/2 tablet or 2.5 mg by mouth 2 (two) times daily   eplerenone (INSPRA) 50 MG tablet Take 50 mg by mouth daily.   hydroxychloroquine (PLAQUENIL) 200 MG tablet Take 200 mg by mouth 2 (two) times daily. Takes at 1200 pm   lisinopril (ZESTRIL) 5 MG tablet Take 5 mg by mouth daily.   mirtazapine (REMERON) 30 MG tablet Take 30 mg by mouth at bedtime.   sirolimus (RAPAMUNE) 1 MG tablet Take 2 mg by mouth See admin instructions. 2 tabs daily   UNABLE TO FIND Orgobyx 120 mg daily in am for prostate, per dr eskridge   zolpidem (AMBIEN) 5 MG tablet Take 10 mg by mouth at bedtime as needed.   No current facility-administered medications for this visit. (Other)      REVIEW OF SYSTEMS: ROS   Negative for: Constitutional, Gastrointestinal, Neurological, Skin, Genitourinary, Musculoskeletal, HENT, Endocrine, Cardiovascular, Eyes,  Respiratory, Psychiatric, Allergic/Imm, Heme/Lymph Last edited by Morene Rankins, CMA on 02/08/2022  3:22 PM.       ALLERGIES Allergies  Allergen Reactions   Prednisone Swelling    Swelling behind the eye that causes not being able to see    PAST MEDICAL HISTORY Past Medical History:  Diagnosis Date   Anemia in chronic kidney disease (CKD)    Blood clot in vein 12/2020   right tibial vein   Chronic kidney disease    s/p renal transplant 10-17-2020   Hypertension    Prostate cancer ([place])    Systemic Lupus ([place])    Past Surgical History:  Procedure Laterality Date   colonscopy  2022   GOLD SEED IMPLANT N/A 12/26/2021   Procedure: GOLD SEED IMPLANT;  Surgeon: Festus Aloe, MD;  Location: [facility];  Service: Urology;  Laterality: N/A;   kidney  transplant  10/17/2020   done @ [facility]   LASIK Bilateral 1999   PROSTATE BIOPSY  2022   RENAL BIOPSY  10/2020   right wrist surgery  2000   SPACE OAR INSTILLATION N/A 12/26/2021   Procedure: SPACE OAR INSTILLATION;  Surgeon: Festus Aloe, MD;  Location: [facility];  Service: Urology;  Laterality: N/A;    FAMILY HISTORY Family History  Problem Relation Age of Onset   Diabetes Mother    Hypertension Father  Diabetes Father     SOCIAL HISTORY Social History   Tobacco Use   Smoking status: Never   Smokeless tobacco: Never  Vaping Use   Vaping Use: Never used  Substance Use Topics   Drug use: Not Currently         OPHTHALMIC EXAM:  Base Eye Exam     Visual Acuity (ETDRS)       Right Left   Dist [place] 20/30 20/20         Tonometry (Tonopen, 3:31 PM)       Right Left   Pressure 10 10         Pupils       Pupils Shape APD   Right PERRL Round None   Left PERRL  None         Visual Fields       Left Right    Full Full         Neuro/Psych     Oriented x3: Yes   Mood/Affect: Normal         Dilation     Right eye: 2.5% Phenylephrine, 1.0%  Mydriacyl @ 3:24 PM           Slit Lamp and Fundus Exam     External Exam       Right Left   External Normal Normal         Slit Lamp Exam       Right Left   Lids/Lashes 1+ Dermatochalasis - upper lid 1+ Dermatochalasis - upper lid   Conjunctiva/Sclera White and quiet White and quiet   Cornea Clear Clear   Anterior Chamber Deep and quiet Deep and quiet   Iris Round and reactive Round and reactive   Lens 1+ Nuclear sclerosis 1+ Nuclear sclerosis   Anterior Vitreous Normal Normal         Fundus Exam       Right Left   Posterior Vitreous Posterior vitreous detachment, Central vitreous floaters    Disc Normal    C/D Ratio 0.5    Macula Disciform scar, Retinal pigment epithelial mottling, Retinal pigment epithelial atrophy, no hemorrhage, no exudates, Subretinal fibrosis    Vessels Normal    Periphery Normal             IMAGING AND PROCEDURES  Imaging and Procedures for 02/08/22  OCT, Retina - OU - Both Eyes       Right Eye Quality was good. Scan locations included subfoveal. Central Foveal Thickness: 352. Progression has improved. Findings include abnormal foveal contour, subretinal hyper-reflective material, epiretinal membrane.   Left Eye Quality was good. Scan locations included subfoveal. Central Foveal Thickness: 246. Progression has improved. Findings include abnormal foveal contour.   Notes OD with much less intraretinal fluid overlying CNVM at 9 weeks post Eylea thus this is a CNVM sensitive to Eylea condition OD will repeat injection today, overall stable OD  OS, now with much less subretinal fluid, on higher dose of systemic eplerenone.  Small amount of fluid peripapillary no extension towards macula remained stable OS     Intravitreal Injection, Pharmacologic Agent - OD - Right Eye       Time Out 02/08/2022. 4:11 PM. Confirmed correct patient, procedure, site, and patient consented.   Anesthesia Topical anesthesia was used. Anesthetic  medications included Lidocaine 4%.   Procedure Preparation included 5% betadine to ocular surface, 10% betadine to eyelids. A 30 gauge needle was used.   Injection: 2 mg aflibercept 2 MG/0.05ML  Route: Intravitreal, Site: Right Eye   NDC: [id number], Lot: [id number], Expiration date: 10/22/2022, Waste: 0 mL   Post-op Post injection exam found visual acuity of at least counting fingers. The patient tolerated the procedure well. There were no complications. The patient received written and verbal post procedure care education. Post injection medications included ocuflox.      Rad Sandria Senter Session Summary      Component Value Flag Ref Range Units Status   Course ID [email or web address]        Final   Course Start Date 12/29/2021        Final   Session Number 18        Final   Course First Treatment Date 01/11/2022  3:37 PM        Final   Course Last Treatment Date 02/08/2022  2:24 PM        Final   Course Elapsed Days 28        Final   Reference Point ID Prostate DP        Final   Reference Point Dosage Given to Date 45       Gy Final   Reference Point Session Dosage Given 2.5       Gy Final   Plan ID Prostate        Final   Plan Fractions Treated to Date 18        Final   Plan Total Fractions Prescribed 28        Final   Plan Prescribed Dose Per Fraction 2.5       Gy Final   Plan Total Prescribed Dose 70.000000       Gy Final   Plan Primary Reference Point Prostate DP        Final                   ASSESSMENT/PLAN:  Exudative age-related macular degeneration of right eye with active choroidal neovascularization ([place]) Subfoveal scar form scar is the new blood supply to the outer retina.  No subretinal fluid no very little intraretinal fluid temporally today.  Central serous retinopathy, left Chronic small serous detachment, improved as compared to 1.5 years previous.  Continue to monitor and observe     ICD-10-CM   1. Exudative age-related macular degeneration of right eye with  active choroidal neovascularization (HCC)  H35.3211 OCT, Retina - OU - Both Eyes    Intravitreal Injection, Pharmacologic Agent - OD - Right Eye    aflibercept (EYLEA) SOLN 2 mg    2. Central serous retinopathy, left  H35.712       1.  2.  3.  Ophthalmic Meds Ordered this visit:  Meds ordered this encounter  Medications   aflibercept (EYLEA) SOLN 2 mg       Return in about 8 weeks (around 04/05/2022) for dilate, OD, EYLEA OCT.  There are no Patient Instructions on file for this visit.   Explained the diagnoses, plan, and follow up with the patient and they expressed understanding.  Patient expressed understanding of the importance of proper follow up care.   Clent Demark Kiara Keep M.D. Diseases & Surgery of the Retina and Vitreous Retina & Diabetic [place] 02/08/22     Abbreviations: M myopia (nearsighted); A astigmatism; H hyperopia (farsighted); P presbyopia; Mrx spectacle prescription;  CTL contact lenses; OD right eye; OS left eye; OU both eyes  XT exotropia; ET esotropia; PEK punctate epithelial keratitis; PEE punctate epithelial erosions; DES dry eye  syndrome; MGD meibomian gland dysfunction; ATs artificial tears; PFAT's preservative free artificial tears; [place] nuclear sclerotic cataract; PSC posterior subcapsular cataract; ERM epi-retinal membrane; PVD posterior vitreous detachment; RD retinal detachment; DM diabetes mellitus; DR diabetic retinopathy; NPDR non-proliferative diabetic retinopathy; PDR proliferative diabetic retinopathy; CSME clinically significant macular edema; DME diabetic macular edema; dbh dot blot hemorrhages; CWS cotton wool spot; POAG primary open angle glaucoma; C/D cup-to-disc ratio; HVF humphrey visual field; GVF goldmann visual field; OCT optical coherence tomography; IOP intraocular pressure; BRVO Branch retinal vein occlusion; CRVO central retinal vein occlusion; CRAO central retinal artery occlusion; BRAO branch retinal artery occlusion; RT retinal  tear; SB scleral buckle; PPV pars plana vitrectomy; VH Vitreous hemorrhage; PRP panretinal laser photocoagulation; IVK intravitreal kenalog; VMT vitreomacular traction; MH Macular hole;  NVD neovascularization of the disc; NVE neovascularization elsewhere; AREDS age related eye disease study; ARMD age related macular degeneration; POAG primary open angle glaucoma; EBMD epithelial/anterior basement membrane dystrophy; ACIOL anterior chamber intraocular lens; IOL intraocular lens; PCIOL posterior chamber intraocular lens; Phaco/IOL phacoemulsification with intraocular lens placement; [place] photorefractive keratectomy; LASIK laser assisted in situ keratomileusis; HTN hypertension; DM diabetes mellitus; COPD chronic obstructive pulmonary disease

## 2022-02-08 NOTE — Assessment & Plan Note (Signed)
Subfoveal scar form scar is the new blood supply to the outer retina.  No subretinal fluid no very little intraretinal fluid temporally today.

## 2022-02-08 NOTE — Assessment & Plan Note (Signed)
Chronic small serous detachment, improved as compared to 1.5 years previous.  Continue to monitor and observe

## 2022-02-09 ENCOUNTER — Ambulatory Visit
Admission: RE | Admit: 2022-02-09 | Discharge: 2022-02-09 | Disposition: A | Discharge status: Home / Self Care | Payer: Commercial Managed Care - PPO | Source: Ambulatory Visit | Attending: Radiation Oncology | Admitting: Radiation Oncology

## 2022-02-09 ENCOUNTER — Other Ambulatory Visit: Payer: Self-pay

## 2022-02-09 DIAGNOSIS — Z510 Encounter for antineoplastic radiation therapy: Secondary | ICD-10-CM | POA: Diagnosis not present

## 2022-02-09 LAB — RAD ONC ARIA SESSION SUMMARY
Course Elapsed Days: 29
Plan Fractions Treated to Date: 19
Plan Prescribed Dose Per Fraction: 2.5 Gy
Plan Total Fractions Prescribed: 28
Plan Total Prescribed Dose: 70 Gy
Reference Point Dosage Given to Date: 47.5 Gy
Reference Point Session Dosage Given: 2.5 Gy
Session Number: 19

## 2022-02-12 ENCOUNTER — Other Ambulatory Visit: Payer: Self-pay

## 2022-02-12 ENCOUNTER — Ambulatory Visit
Admission: RE | Admit: 2022-02-12 | Discharge: 2022-02-12 | Disposition: A | Discharge status: Home / Self Care | Payer: Commercial Managed Care - PPO | Source: Ambulatory Visit | Attending: Radiation Oncology | Admitting: Radiation Oncology

## 2022-02-12 DIAGNOSIS — Z510 Encounter for antineoplastic radiation therapy: Secondary | ICD-10-CM | POA: Diagnosis not present

## 2022-02-12 LAB — RAD ONC ARIA SESSION SUMMARY
Course Elapsed Days: 32
Plan Fractions Treated to Date: 20
Plan Prescribed Dose Per Fraction: 2.5 Gy
Plan Total Fractions Prescribed: 28
Plan Total Prescribed Dose: 70 Gy
Reference Point Dosage Given to Date: 50 Gy
Reference Point Session Dosage Given: 2.5 Gy
Session Number: 20

## 2022-02-13 ENCOUNTER — Other Ambulatory Visit: Payer: Self-pay

## 2022-02-13 ENCOUNTER — Ambulatory Visit
Admission: RE | Admit: 2022-02-13 | Discharge: 2022-02-13 | Disposition: A | Discharge status: Home / Self Care | Payer: Commercial Managed Care - PPO | Source: Ambulatory Visit | Attending: Radiation Oncology | Admitting: Radiation Oncology

## 2022-02-13 DIAGNOSIS — Z510 Encounter for antineoplastic radiation therapy: Secondary | ICD-10-CM | POA: Diagnosis not present

## 2022-02-13 LAB — RAD ONC ARIA SESSION SUMMARY
Course Elapsed Days: 33
Plan Fractions Treated to Date: 21
Plan Prescribed Dose Per Fraction: 2.5 Gy
Plan Total Fractions Prescribed: 28
Plan Total Prescribed Dose: 70 Gy
Reference Point Dosage Given to Date: 52.5 Gy
Reference Point Session Dosage Given: 2.5 Gy
Session Number: 21

## 2022-02-14 ENCOUNTER — Other Ambulatory Visit: Payer: Self-pay

## 2022-02-14 ENCOUNTER — Ambulatory Visit
Admission: RE | Admit: 2022-02-14 | Discharge: 2022-02-14 | Disposition: A | Discharge status: Home / Self Care | Payer: Commercial Managed Care - PPO | Source: Ambulatory Visit | Attending: Radiation Oncology | Admitting: Radiation Oncology

## 2022-02-14 DIAGNOSIS — Z510 Encounter for antineoplastic radiation therapy: Secondary | ICD-10-CM | POA: Diagnosis not present

## 2022-02-14 LAB — RAD ONC ARIA SESSION SUMMARY
Course Elapsed Days: 34
Plan Fractions Treated to Date: 22
Plan Prescribed Dose Per Fraction: 2.5 Gy
Plan Total Fractions Prescribed: 28
Plan Total Prescribed Dose: 70 Gy
Reference Point Dosage Given to Date: 55 Gy
Reference Point Session Dosage Given: 2.5 Gy
Session Number: 22

## 2022-02-15 ENCOUNTER — Other Ambulatory Visit: Payer: Self-pay

## 2022-02-15 ENCOUNTER — Ambulatory Visit
Admission: RE | Admit: 2022-02-15 | Discharge: 2022-02-15 | Disposition: A | Discharge status: Home / Self Care | Payer: Commercial Managed Care - PPO | Source: Ambulatory Visit | Attending: Radiation Oncology | Admitting: Radiation Oncology

## 2022-02-15 DIAGNOSIS — Z510 Encounter for antineoplastic radiation therapy: Secondary | ICD-10-CM | POA: Diagnosis not present

## 2022-02-15 LAB — RAD ONC ARIA SESSION SUMMARY
Course Elapsed Days: 35
Plan Fractions Treated to Date: 23
Plan Prescribed Dose Per Fraction: 2.5 Gy
Plan Total Fractions Prescribed: 28
Plan Total Prescribed Dose: 70 Gy
Reference Point Dosage Given to Date: 57.5 Gy
Reference Point Session Dosage Given: 2.5 Gy
Session Number: 23

## 2022-02-16 ENCOUNTER — Other Ambulatory Visit: Payer: Self-pay

## 2022-02-16 ENCOUNTER — Ambulatory Visit
Admission: RE | Admit: 2022-02-16 | Discharge: 2022-02-16 | Disposition: A | Discharge status: Home / Self Care | Payer: Commercial Managed Care - PPO | Source: Ambulatory Visit | Attending: Radiation Oncology | Admitting: Radiation Oncology

## 2022-02-16 DIAGNOSIS — Z510 Encounter for antineoplastic radiation therapy: Secondary | ICD-10-CM | POA: Diagnosis not present

## 2022-02-16 LAB — RAD ONC ARIA SESSION SUMMARY
Course Elapsed Days: 36
Plan Fractions Treated to Date: 24
Plan Prescribed Dose Per Fraction: 2.5 Gy
Plan Total Fractions Prescribed: 28
Plan Total Prescribed Dose: 70 Gy
Reference Point Dosage Given to Date: 60 Gy
Reference Point Session Dosage Given: 2.5 Gy
Session Number: 24

## 2022-02-19 ENCOUNTER — Other Ambulatory Visit: Payer: Self-pay

## 2022-02-19 ENCOUNTER — Ambulatory Visit
Admission: RE | Admit: 2022-02-19 | Discharge: 2022-02-19 | Disposition: A | Discharge status: Home / Self Care | Payer: Commercial Managed Care - PPO | Source: Ambulatory Visit | Attending: Radiation Oncology | Admitting: Radiation Oncology

## 2022-02-19 DIAGNOSIS — Z510 Encounter for antineoplastic radiation therapy: Secondary | ICD-10-CM | POA: Diagnosis not present

## 2022-02-19 LAB — RAD ONC ARIA SESSION SUMMARY
Course Elapsed Days: 39
Plan Fractions Treated to Date: 25
Plan Prescribed Dose Per Fraction: 2.5 Gy
Plan Total Fractions Prescribed: 28
Plan Total Prescribed Dose: 70 Gy
Reference Point Dosage Given to Date: 62.5 Gy
Reference Point Session Dosage Given: 2.5 Gy
Session Number: 25

## 2022-02-20 ENCOUNTER — Ambulatory Visit
Admission: RE | Admit: 2022-02-20 | Discharge: 2022-02-20 | Disposition: A | Discharge status: Home / Self Care | Payer: Commercial Managed Care - PPO | Source: Ambulatory Visit | Attending: Radiation Oncology | Admitting: Radiation Oncology

## 2022-02-20 ENCOUNTER — Ambulatory Visit: Payer: Commercial Managed Care - PPO

## 2022-02-20 ENCOUNTER — Other Ambulatory Visit: Payer: Self-pay

## 2022-02-20 DIAGNOSIS — C61 Malignant neoplasm of prostate: Secondary | ICD-10-CM | POA: Diagnosis present

## 2022-02-20 DIAGNOSIS — Z510 Encounter for antineoplastic radiation therapy: Secondary | ICD-10-CM | POA: Diagnosis present

## 2022-02-20 LAB — RAD ONC ARIA SESSION SUMMARY
Course Elapsed Days: 40
Plan Fractions Treated to Date: 26
Plan Prescribed Dose Per Fraction: 2.5 Gy
Plan Total Fractions Prescribed: 28
Plan Total Prescribed Dose: 70 Gy
Reference Point Dosage Given to Date: 65 Gy
Reference Point Session Dosage Given: 2.5 Gy
Session Number: 26

## 2022-02-21 ENCOUNTER — Ambulatory Visit
Admission: RE | Admit: 2022-02-21 | Discharge: 2022-02-21 | Disposition: A | Discharge status: Home / Self Care | Payer: Commercial Managed Care - PPO | Source: Ambulatory Visit | Attending: Radiation Oncology | Admitting: Radiation Oncology

## 2022-02-21 ENCOUNTER — Other Ambulatory Visit: Payer: Self-pay

## 2022-02-21 ENCOUNTER — Ambulatory Visit: Payer: Commercial Managed Care - PPO

## 2022-02-21 DIAGNOSIS — Z510 Encounter for antineoplastic radiation therapy: Secondary | ICD-10-CM | POA: Diagnosis not present

## 2022-02-21 LAB — RAD ONC ARIA SESSION SUMMARY
Course Elapsed Days: 41
Plan Fractions Treated to Date: 27
Plan Prescribed Dose Per Fraction: 2.5 Gy
Plan Total Fractions Prescribed: 28
Plan Total Prescribed Dose: 70 Gy
Reference Point Dosage Given to Date: 67.5 Gy
Reference Point Session Dosage Given: 2.5 Gy
Session Number: 27

## 2022-02-22 ENCOUNTER — Ambulatory Visit
Admission: RE | Admit: 2022-02-22 | Discharge: 2022-02-22 | Disposition: A | Discharge status: Home / Self Care | Payer: Commercial Managed Care - PPO | Source: Ambulatory Visit | Attending: Radiation Oncology | Admitting: Radiation Oncology

## 2022-02-22 ENCOUNTER — Other Ambulatory Visit: Payer: Self-pay

## 2022-02-22 ENCOUNTER — Ambulatory Visit: Payer: Commercial Managed Care - PPO

## 2022-02-22 ENCOUNTER — Encounter: Payer: Self-pay | Admitting: Urology

## 2022-02-22 DIAGNOSIS — C61 Malignant neoplasm of prostate: Secondary | ICD-10-CM

## 2022-02-22 DIAGNOSIS — Z510 Encounter for antineoplastic radiation therapy: Secondary | ICD-10-CM | POA: Diagnosis not present

## 2022-02-22 LAB — RAD ONC ARIA SESSION SUMMARY
Course Elapsed Days: 42
Plan Fractions Treated to Date: 28
Plan Prescribed Dose Per Fraction: 2.5 Gy
Plan Total Fractions Prescribed: 28
Plan Total Prescribed Dose: 70 Gy
Reference Point Dosage Given to Date: 70 Gy
Reference Point Session Dosage Given: 2.5 Gy
Session Number: 28

## 2022-03-29 ENCOUNTER — Ambulatory Visit (INDEPENDENT_AMBULATORY_CARE_PROVIDER_SITE_OTHER): Payer: Commercial Managed Care - PPO | Admitting: Ophthalmology

## 2022-03-29 ENCOUNTER — Encounter (INDEPENDENT_AMBULATORY_CARE_PROVIDER_SITE_OTHER): Payer: Self-pay | Admitting: Ophthalmology

## 2022-03-29 DIAGNOSIS — H353211 Exudative age-related macular degeneration, right eye, with active choroidal neovascularization: Secondary | ICD-10-CM | POA: Diagnosis not present

## 2022-03-29 DIAGNOSIS — H35712 Central serous chorioretinopathy, left eye: Secondary | ICD-10-CM | POA: Diagnosis not present

## 2022-03-29 DIAGNOSIS — H35351 Cystoid macular degeneration, right eye: Secondary | ICD-10-CM | POA: Diagnosis not present

## 2022-03-29 DIAGNOSIS — H2511 Age-related nuclear cataract, right eye: Secondary | ICD-10-CM | POA: Diagnosis not present

## 2022-03-29 MED ORDER — AFLIBERCEPT 2MG/0.05ML IZ SOLN FOR KALEIDOSCOPE
2.0000 mg | INTRAVITREAL | Status: AC | PRN
  Administered 2022-03-29: 2 mg via INTRAVITREAL

## 2022-03-29 NOTE — Assessment & Plan Note (Signed)
By OCT currently resolved OS

## 2022-03-29 NOTE — Assessment & Plan Note (Signed)
Component of CNVM improved

## 2022-03-29 NOTE — Assessment & Plan Note (Signed)
OD much improved intraretinal fluid and resolved subretinal fluid today 7 weeks post Eylea.  Repeat injection today and reevaluate next in 8 weeks

## 2022-03-29 NOTE — Assessment & Plan Note (Signed)
No significant progression

## 2022-03-29 NOTE — Progress Notes (Signed)
03/29/2022     CHIEF COMPLAINT Patient presents for  Chief Complaint  Patient presents with   Macular Degeneration      HISTORY OF PRESENT ILLNESS: Jordan Hanna is a 61 y.o. male who presents to the clinic today for:   HPI   Longstanding history of central serous retinopathy complicated late onset by CNVM formation some 5 years previous.  Responsive and improved macular condition on intravitreal antivegF now off and on during that time.  Mostly Eylea.  States that visual acuity is remained stable OS and OD.  8 weeks dilate od eylea oct Pt states his vision has been stable Pt denies any new floaters or FOL Last edited by Hurman Horn, MD on 03/29/2022  4:20 PM.      Referring physician: Fabian November., MD [place]  [place],  [place] [id number]  HISTORICAL INFORMATION:   Selected notes from the MEDICAL RECORD NUMBER       CURRENT MEDICATIONS: No current outpatient medications on file. (Ophthalmic Drugs)   No current facility-administered medications for this visit. (Ophthalmic Drugs)   Current Outpatient Medications (Other)  Medication Sig   Belatacept (NULOJIX IV) Inject into the vein. Takes the 12th of every month-infusion   ELIQUIS 5 MG TABS tablet Take 1 tablet (5 mg total) by mouth 2 (two) times daily. Take 1/2 tablet or 2.5 mg by mouth 2 (two) times daily   eplerenone (INSPRA) 50 MG tablet Take 50 mg by mouth daily.   hydroxychloroquine (PLAQUENIL) 200 MG tablet Take 200 mg by mouth 2 (two) times daily. Takes at 1200 pm   lisinopril (ZESTRIL) 5 MG tablet Take 5 mg by mouth daily.   mirtazapine (REMERON) 30 MG tablet Take 30 mg by mouth at bedtime.   sirolimus (RAPAMUNE) 1 MG tablet Take 2 mg by mouth See admin instructions. 2 tabs daily   UNABLE TO FIND Orgobyx 120 mg daily in am for prostate, per dr eskridge   zolpidem (AMBIEN) 5 MG tablet Take 10 mg by mouth at bedtime as needed.   No current facility-administered medications for  this visit. (Other)      REVIEW OF SYSTEMS: ROS   Negative for: Constitutional, Gastrointestinal, Neurological, Skin, Genitourinary, Musculoskeletal, HENT, Endocrine, Cardiovascular, Eyes, Respiratory, Psychiatric, Allergic/Imm, Heme/Lymph Last edited by Morene Rankins, CMA on 03/29/2022  3:16 PM.       ALLERGIES Allergies  Allergen Reactions   Prednisone Swelling    Swelling behind the eye that causes not being able to see    PAST MEDICAL HISTORY Past Medical History:  Diagnosis Date   Anemia in chronic kidney disease (CKD)    Blood clot in vein 12/2020   right tibial vein   Chronic kidney disease    s/p renal transplant 10-17-2020   Hypertension    Prostate cancer ([place])    Systemic Lupus ([place])    Past Surgical History:  Procedure Laterality Date   colonscopy  2022   GOLD SEED IMPLANT N/A 12/26/2021   Procedure: GOLD SEED IMPLANT;  Surgeon: Festus Aloe, MD;  Location: [facility];  Service: Urology;  Laterality: N/A;   kidney  transplant  10/17/2020   done @ [facility]   LASIK Bilateral 1999   PROSTATE BIOPSY  2022   RENAL BIOPSY  10/2020   right wrist surgery  2000   SPACE OAR INSTILLATION N/A 12/26/2021   Procedure: SPACE OAR INSTILLATION;  Surgeon: Festus Aloe, MD;  Location: [facility];  Service: Urology;  Laterality: N/A;    FAMILY HISTORY Family History  Problem Relation Age of Onset   Diabetes Mother    Hypertension Father    Diabetes Father     SOCIAL HISTORY Social History   Tobacco Use   Smoking status: Never   Smokeless tobacco: Never  Vaping Use   Vaping Use: Never used  Substance Use Topics   Drug use: Not Currently         OPHTHALMIC EXAM:  Base Eye Exam     Visual Acuity (ETDRS)       Right Left   Dist cc 20/40 +3 20/20    Correction: Glasses         Tonometry (Tonopen, 3:20 PM)       Right Left   Pressure 5 5         Visual Fields       Left Right    Full Full          Extraocular Movement       Right Left    Ortho Ortho    -- -- --  --  --  -- -- --   -- -- --  --  --  -- -- --           Neuro/Psych     Oriented x3: Yes   Mood/Affect: Normal         Dilation     Right eye: 2.5% Phenylephrine, 1.0% Mydriacyl @ 3:17 PM           Slit Lamp and Fundus Exam     External Exam       Right Left   External Normal Normal         Slit Lamp Exam       Right Left   Lids/Lashes 1+ Dermatochalasis - upper lid 1+ Dermatochalasis - upper lid   Conjunctiva/Sclera White and quiet White and quiet   Cornea Clear Clear   Anterior Chamber Deep and quiet Deep and quiet   Iris Round and reactive Round and reactive   Lens 1+ Nuclear sclerosis 1+ Nuclear sclerosis   Anterior Vitreous Normal Normal         Fundus Exam       Right Left   Posterior Vitreous Posterior vitreous detachment, Central vitreous floaters    Disc Normal    C/D Ratio 0.5    Macula Disciform scar, Retinal pigment epithelial mottling, Retinal pigment epithelial atrophy, no hemorrhage, no exudates, Subretinal fibrosis    Vessels Normal    Periphery Normal             IMAGING AND PROCEDURES  Imaging and Procedures for 03/29/22  OCT, Retina - OU - Both Eyes       Right Eye Quality was good. Scan locations included subfoveal. Central Foveal Thickness: 305. Progression has improved. Findings include abnormal foveal contour, subretinal hyper-reflective material, epiretinal membrane.   Left Eye Quality was good. Scan locations included subfoveal. Central Foveal Thickness: 239. Progression has improved. Findings include abnormal foveal contour.   Notes OD with much less intraretinal fluid overlying CNVM at 7 weeks post Eylea thus this is a CNVM sensitive to Eylea condition OD will repeat injection today, overall stable OD  OS, now with much less subretinal fluid, on higher dose of systemic eplerenone.  Small amount of fluid peripapillary no extension towards  macula remained stable OS      Intravitreal Injection, Pharmacologic Agent - OD - Right Eye  Time Out 03/29/2022. 4:21 PM. Confirmed correct patient, procedure, site, and patient consented.   Anesthesia Topical anesthesia was used. Anesthetic medications included Lidocaine 4%.   Procedure Preparation included 5% betadine to ocular surface, 10% betadine to eyelids. A 30 gauge needle was used.   Injection: 2 mg aflibercept 2 MG/0.05ML   Route: Intravitreal, Site: Right Eye   NDC: [id number], Lot: [id number], Expiration date: 03/25/2023, Waste: 0 mL   Post-op Post injection exam found visual acuity of at least counting fingers. The patient tolerated the procedure well. There were no complications. The patient received written and verbal post procedure care education. Post injection medications included ocuflox.              ASSESSMENT/PLAN:  Exudative age-related macular degeneration of right eye with active choroidal neovascularization (HCC) OD much improved intraretinal fluid and resolved subretinal fluid today 7 weeks post Eylea.  Repeat injection today and reevaluate next in 8 weeks  Cystoid macular edema of right eye Component of CNVM improved  Central serous retinopathy, left By OCT currently resolved OS  Nuclear sclerotic cataract of right eye No significant progression     ICD-10-CM   1. Exudative age-related macular degeneration of right eye with active choroidal neovascularization (HCC)  H35.3211 OCT, Retina - OU - Both Eyes    Intravitreal Injection, Pharmacologic Agent - OD - Right Eye    aflibercept (EYLEA) SOLN 2 mg    2. Cystoid macular edema of right eye  H35.351     3. Central serous retinopathy, left  H35.712     4. Nuclear sclerotic cataract of right eye  H25.11       1.  OD vastly improved today.  Much less intraretinal fluid no subretinal fluid.  7-week interval post Eylea.  Repeat injection today reevaluate next in a week  2.  OS no  recurrence and no ongoing serous subretinal fluid.  No sign of CNVM.  Continue to observe  3.  Ophthalmic Meds Ordered this visit:  Meds ordered this encounter  Medications   aflibercept (EYLEA) SOLN 2 mg       Return in about 8 weeks (around 05/24/2022) for dilate, OD, EYLEA OCT.  There are no Patient Instructions on file for this visit.   Explained the diagnoses, plan, and follow up with the patient and they expressed understanding.  Patient expressed understanding of the importance of proper follow up care.   Clent Demark Levester Waldridge M.D. Diseases & Surgery of the Retina and Vitreous Retina & Diabetic [place] 03/29/22     Abbreviations: M myopia (nearsighted); A astigmatism; H hyperopia (farsighted); P presbyopia; Mrx spectacle prescription;  CTL contact lenses; OD right eye; OS left eye; OU both eyes  XT exotropia; ET esotropia; PEK punctate epithelial keratitis; PEE punctate epithelial erosions; DES dry eye syndrome; MGD meibomian gland dysfunction; ATs artificial tears; PFAT's preservative free artificial tears; [place] nuclear sclerotic cataract; PSC posterior subcapsular cataract; ERM epi-retinal membrane; PVD posterior vitreous detachment; RD retinal detachment; DM diabetes mellitus; DR diabetic retinopathy; NPDR non-proliferative diabetic retinopathy; PDR proliferative diabetic retinopathy; CSME clinically significant macular edema; DME diabetic macular edema; dbh dot blot hemorrhages; CWS cotton wool spot; POAG primary open angle glaucoma; C/D cup-to-disc ratio; HVF humphrey visual field; GVF goldmann visual field; OCT optical coherence tomography; IOP intraocular pressure; BRVO Branch retinal vein occlusion; CRVO central retinal vein occlusion; CRAO central retinal artery occlusion; BRAO branch retinal artery occlusion; RT retinal tear; SB scleral buckle; PPV pars plana vitrectomy; VH Vitreous hemorrhage; PRP panretinal  laser photocoagulation; IVK intravitreal kenalog; VMT vitreomacular  traction; MH Macular hole;  NVD neovascularization of the disc; NVE neovascularization elsewhere; AREDS age related eye disease study; ARMD age related macular degeneration; POAG primary open angle glaucoma; EBMD epithelial/anterior basement membrane dystrophy; ACIOL anterior chamber intraocular lens; IOL intraocular lens; PCIOL posterior chamber intraocular lens; Phaco/IOL phacoemulsification with intraocular lens placement; [place] photorefractive keratectomy; LASIK laser assisted in situ keratomileusis; HTN hypertension; DM diabetes mellitus; COPD chronic obstructive pulmonary disease

## 2022-04-05 ENCOUNTER — Encounter (INDEPENDENT_AMBULATORY_CARE_PROVIDER_SITE_OTHER): Payer: Commercial Managed Care - PPO | Admitting: Ophthalmology

## 2022-04-05 NOTE — Progress Notes (Signed)
Radiation Oncology         [phone] ________________________________  Name: Jordan Hanna MRN: [id number]  Date: 04/10/2022  DOB: 11-Jul-1961  Post Treatment Note  CC: Fabian November., MD  Festus Aloe, MD  Diagnosis:   61 y.o. gentleman with Stage T1c adenocarcinoma of the prostate with Gleason score of 4+3, and PSA of 14.9.  Interval Since Last Radiation:  6.5 weeks  01/11/22 - 02/22/22:  The prostate was treated to 70 Gy in 28 fractions of 2.5 Gy; concurrent with ST-ADT (Orgovyx started 11/23/21)  Narrative:  I spoke with the patient to conduct his routine scheduled 1 month follow up visit via telephone to spare the patient unnecessary potential exposure in the healthcare setting during the current COVID-19 pandemic.  The patient was notified in advance and gave permission to proceed with this visit format.  He tolerated radiation treatment relatively well with only minor urinary irritation and modest fatigue.  He did report increased frequency and nocturia 2-3 times per night but specifically denied dysuria, gross hematuria, straining to void, incomplete bladder emptying or incontinence.  He denied any abdominal pain or bowel issues and did not notice any significant change in his energy level.                              On review of systems, the patient states that he is doing very well in general and is currently without complaints.  He continues with nocturia 2 times per night which is minimally changed from his baseline and otherwise he specifically denies other bothersome LUTS such as dysuria, gross hematuria, straining to void, incomplete bladder emptying or incontinence.  He reports a healthy appetite and is maintaining his weight.  He denies abdominal pain, nausea, vomiting, diarrhea or constipation.  His energy level is gradually improving and overall, he is pleased with his progress to date.  He continues to tolerate the Orgovyx fairly well.  He had a recent follow-up  visit with Dr. Junious Silk on 03/28/2022 with a PSA that day showing an excellent response, decreased down to 0.059.  ALLERGIES:  is allergic to prednisone.  Meds: Current Outpatient Medications  Medication Sig Dispense Refill   Belatacept (NULOJIX IV) Inject into the vein. Takes the 12th of every month-infusion     ELIQUIS 5 MG TABS tablet Take 1 tablet (5 mg total) by mouth 2 (two) times daily. Take 1/2 tablet or 2.5 mg by mouth 2 (two) times daily 60 tablet    eplerenone (INSPRA) 50 MG tablet Take 50 mg by mouth daily.     hydroxychloroquine (PLAQUENIL) 200 MG tablet Take 200 mg by mouth 2 (two) times daily. Takes at 1200 pm     lisinopril (ZESTRIL) 5 MG tablet Take 5 mg by mouth daily.     mirtazapine (REMERON) 30 MG tablet Take 30 mg by mouth at bedtime.     sirolimus (RAPAMUNE) 1 MG tablet Take 2 mg by mouth See admin instructions. 2 tabs daily     UNABLE TO FIND Orgobyx 120 mg daily in am for prostate, per dr eskridge     zolpidem (AMBIEN) 5 MG tablet Take 10 mg by mouth at bedtime as needed.     No current facility-administered medications for this visit.    Physical Findings:  vitals were not taken for this visit.   /10 Unable to assess due to telephone follow up visit format.  Lab Findings: Lab Results  Component  Value Date   WBC 6.7 06/06/2021   HGB 10.0 (L) 06/06/2021   HCT 31.1 (L) 06/06/2021   MCV 88.4 06/06/2021   PLT 201 06/06/2021     Radiographic Findings: Intravitreal Injection, Pharmacologic Agent - OD - Right Eye  Result Date: 03/29/2022 Time Out 03/29/2022. 4:21 PM. Confirmed correct patient, procedure, site, and patient consented. Anesthesia Topical anesthesia was used. Anesthetic medications included Lidocaine 4%. Procedure Preparation included 5% betadine to ocular surface, 10% betadine to eyelids. A 30 gauge needle was used. Injection: 2 mg aflibercept 2 MG/0.05ML   Route: Intravitreal, Site: Right Eye   NDC: [id number], Lot: [id number], Expiration date:  03/25/2023, Waste: 0 mL Post-op Post injection exam found visual acuity of at least counting fingers. The patient tolerated the procedure well. There were no complications. The patient received written and verbal post procedure care education. Post injection medications included ocuflox.   OCT, Retina - OU - Both Eyes  Result Date: 03/29/2022 Right Eye Quality was good. Scan locations included subfoveal. Central Foveal Thickness: 305. Progression has improved. Findings include abnormal foveal contour, subretinal hyper-reflective material, epiretinal membrane. Left Eye Quality was good. Scan locations included subfoveal. Central Foveal Thickness: 239. Progression has improved. Findings include abnormal foveal contour. 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  Small amount of fluid peripapillary no extension towards macula remained stable OS   Impression/Plan: 1. 61 y.o. gentleman with Stage T1c adenocarcinoma of the prostate with Gleason score of 4+3, and PSA of 14.9. He will continue to follow up with urology for ongoing PSA determinations and had a recent follow-up visit with Dr. Junious Silk on 03/28/2022 with labs that day showing an excellent response to treatment with PSA down to 0.059.  He has continued taking Orgovyx daily and is tolerating this fairly well.Marland Kitchen He understands what to expect with regards to PSA monitoring going forward.  He will have repeat labs in December 2023 and a follow-up visit with Dr. Junious Silk the following week.  I will look forward to following his response to treatment via correspondence with urology, and would be happy to continue to participate in his care if clinically indicated. I talked to the patient about what to expect in the future, including his risk for  erectile dysfunction and rectal bleeding. I encouraged him to call or return to the office if he has any questions regarding his previous radiation or possible radiation side effects. He was comfortable with this plan and will follow up as needed.    Nicholos Johns, PA-C

## 2022-04-05 NOTE — Progress Notes (Signed)
  Radiation Oncology         [phone]) [phone] ________________________________  Name: Jordan Hanna MRN: [id number]  Date: 02/22/2022  DOB: [birth year]  End of Treatment Note  Diagnosis:   61 y.o. gentleman with Stage T1c adenocarcinoma of the prostate with Gleason score of 4+3, and PSA of 14.9.     Indication for treatment:  Curative, Definitive Radiotherapy       Radiation treatment dates:   01/11/22 - 02/22/22  Site/dose:   The prostate was treated to 70 Gy in 28 fractions of 2.5 Gy  Beams/energy:   The patient was treated with IMRT using volumetric arc therapy delivering 6 MV X-rays to clockwise and counterclockwise circumferential arcs with a 90 degree collimator offset to avoid dose scalloping.  Image guidance was performed with daily cone beam CT prior to each fraction to align to gold markers in the prostate and assure proper bladder and rectal fill volumes.  Immobilization was achieved with BodyFix custom mold.  Narrative: The patient tolerated radiation treatment relatively well with only minor urinary irritation and modest fatigue.  He did report increased frequency and nocturia 2-3 times per night but specifically denied dysuria, gross hematuria, straining to void, incomplete bladder emptying or incontinence.  He denied any abdominal pain or bowel issues and did not notice any significant change in his energy level.  Plan: The patient has completed radiation treatment. He will return to radiation oncology clinic for routine followup in one month. I advised him to call or return sooner if he has any questions or concerns related to his recovery or treatment. ________________________________  Sheral Apley. Tammi Klippel, M.D.

## 2022-04-07 ENCOUNTER — Other Ambulatory Visit: Payer: Self-pay

## 2022-04-07 ENCOUNTER — Emergency Department (HOSPITAL_BASED_OUTPATIENT_CLINIC_OR_DEPARTMENT_OTHER): Payer: Commercial Managed Care - PPO | Admitting: Radiology

## 2022-04-07 ENCOUNTER — Encounter (HOSPITAL_BASED_OUTPATIENT_CLINIC_OR_DEPARTMENT_OTHER): Payer: Self-pay

## 2022-04-07 ENCOUNTER — Emergency Department (HOSPITAL_BASED_OUTPATIENT_CLINIC_OR_DEPARTMENT_OTHER)
Admission: EM | Admit: 2022-04-07 | Discharge: 2022-04-07 | Disposition: A | Discharge status: Home / Self Care | Payer: Commercial Managed Care - PPO | Attending: Emergency Medicine | Admitting: Emergency Medicine

## 2022-04-07 ENCOUNTER — Emergency Department (HOSPITAL_BASED_OUTPATIENT_CLINIC_OR_DEPARTMENT_OTHER): Payer: Commercial Managed Care - PPO

## 2022-04-07 DIAGNOSIS — R197 Diarrhea, unspecified: Secondary | ICD-10-CM | POA: Insufficient documentation

## 2022-04-07 DIAGNOSIS — Z8546 Personal history of malignant neoplasm of prostate: Secondary | ICD-10-CM | POA: Insufficient documentation

## 2022-04-07 DIAGNOSIS — K5900 Constipation, unspecified: Secondary | ICD-10-CM | POA: Diagnosis not present

## 2022-04-07 DIAGNOSIS — I129 Hypertensive chronic kidney disease with stage 1 through stage 4 chronic kidney disease, or unspecified chronic kidney disease: Secondary | ICD-10-CM | POA: Diagnosis not present

## 2022-04-07 DIAGNOSIS — Z940 Kidney transplant status: Secondary | ICD-10-CM | POA: Diagnosis not present

## 2022-04-07 DIAGNOSIS — D649 Anemia, unspecified: Secondary | ICD-10-CM | POA: Insufficient documentation

## 2022-04-07 DIAGNOSIS — R0602 Shortness of breath: Secondary | ICD-10-CM | POA: Diagnosis present

## 2022-04-07 DIAGNOSIS — Z7901 Long term (current) use of anticoagulants: Secondary | ICD-10-CM | POA: Insufficient documentation

## 2022-04-07 DIAGNOSIS — J209 Acute bronchitis, unspecified: Secondary | ICD-10-CM | POA: Insufficient documentation

## 2022-04-07 DIAGNOSIS — Z20822 Contact with and (suspected) exposure to covid-19: Secondary | ICD-10-CM | POA: Diagnosis not present

## 2022-04-07 DIAGNOSIS — D72819 Decreased white blood cell count, unspecified: Secondary | ICD-10-CM | POA: Diagnosis not present

## 2022-04-07 DIAGNOSIS — N189 Chronic kidney disease, unspecified: Secondary | ICD-10-CM | POA: Diagnosis not present

## 2022-04-07 DIAGNOSIS — Z79899 Other long term (current) drug therapy: Secondary | ICD-10-CM | POA: Insufficient documentation

## 2022-04-07 LAB — CBC WITH DIFFERENTIAL/PLATELET
Abs Immature Granulocytes: 0.02 10*3/uL (ref 0.00–0.07)
Basophils Absolute: 0.1 10*3/uL (ref 0.0–0.1)
Basophils Relative: 2 %
Eosinophils Absolute: 0.1 10*3/uL (ref 0.0–0.5)
Eosinophils Relative: 3 %
HCT: 30.5 % — ABNORMAL LOW (ref 39.0–52.0)
Hemoglobin: 10.2 g/dL — ABNORMAL LOW (ref 13.0–17.0)
Immature Granulocytes: 1 %
Lymphocytes Relative: 13 %
Lymphs Abs: 0.3 10*3/uL — ABNORMAL LOW (ref 0.7–4.0)
MCH: 29 pg (ref 26.0–34.0)
MCHC: 33.4 g/dL (ref 30.0–36.0)
MCV: 86.6 fL (ref 80.0–100.0)
Monocytes Absolute: 0.5 10*3/uL (ref 0.1–1.0)
Monocytes Relative: 20 %
Neutro Abs: 1.6 10*3/uL — ABNORMAL LOW (ref 1.7–7.7)
Neutrophils Relative %: 61 %
Platelets: 184 10*3/uL (ref 150–400)
RBC: 3.52 MIL/uL — ABNORMAL LOW (ref 4.22–5.81)
RDW: 14.5 % (ref 11.5–15.5)
WBC: 2.6 10*3/uL — ABNORMAL LOW (ref 4.0–10.5)
nRBC: 0 % (ref 0.0–0.2)

## 2022-04-07 LAB — BASIC METABOLIC PANEL
Anion gap: 10 (ref 5–15)
BUN: 38 mg/dL — ABNORMAL HIGH (ref 8–23)
CO2: 20 mmol/L — ABNORMAL LOW (ref 22–32)
Calcium: 10 mg/dL (ref 8.9–10.3)
Chloride: 106 mmol/L (ref 98–111)
Creatinine, Ser: 2.9 mg/dL — ABNORMAL HIGH (ref 0.61–1.24)
GFR, Estimated: 24 mL/min — ABNORMAL LOW (ref >60)
Glucose, Bld: 102 mg/dL — ABNORMAL HIGH (ref 70–99)
Potassium: 4 mmol/L (ref 3.5–5.1)
Sodium: 136 mmol/L (ref 135–145)

## 2022-04-07 LAB — RESP PANEL BY RT-PCR (FLU A&B, COVID) ARPGX2
Influenza A by PCR: NEGATIVE
Influenza B by PCR: NEGATIVE
SARS Coronavirus 2 by RT PCR: NEGATIVE

## 2022-04-07 MED ORDER — AZITHROMYCIN 250 MG PO TABS: 250.0000 mg | ORAL_TABLET | Freq: Every day | ORAL | 0 refills | Status: DC

## 2022-04-07 MED ORDER — BENZONATATE 100 MG PO CAPS: 100.0000 mg | ORAL_CAPSULE | Freq: Three times a day (TID) | ORAL | 0 refills | Status: DC

## 2022-04-07 NOTE — ED Notes (Signed)
Dc instructions reviewed with patient. Patient voiced understanding. Dc with belongings.  °

## 2022-04-07 NOTE — ED Provider Notes (Signed)
Jordan Hanna Provider Note   CSN: [id number] Arrival date & time: 04/07/22  1011     History {Add pertinent medical, surgical, social history, OB history to HPI:1} Chief Complaint  Patient presents with   Shortness of Breath    Jordan Hanna is a 61 y.o. male.   Shortness of Breath      Home Medications Prior to Admission medications   Medication Sig Start Date End Date Taking? Authorizing Provider  Belatacept (NULOJIX IV) Inject into the vein. Takes the 12th of every month-infusion    [provider]  ELIQUIS 5 MG TABS tablet Take 1 tablet (5 mg total) by mouth 2 (two) times daily. Take 1/2 tablet or 2.5 mg by mouth 2 (two) times daily 12/28/21   Festus Aloe, MD  eplerenone (INSPRA) 50 MG tablet Take 50 mg by mouth daily. 09/06/20   [provider]  hydroxychloroquine (PLAQUENIL) 200 MG tablet Take 200 mg by mouth 2 (two) times daily. Takes at 1200 pm 06/21/16   [provider]  lisinopril (ZESTRIL) 5 MG tablet Take 5 mg by mouth daily.    [provider]  mirtazapine (REMERON) 30 MG tablet Take 30 mg by mouth at bedtime. 08/04/19   [provider]  sirolimus (RAPAMUNE) 1 MG tablet Take 2 mg by mouth See admin instructions. 2 tabs daily 05/18/21   [provider]  UNABLE TO FIND Orgobyx 120 mg daily in am for prostate, per dr eskridge    [provider]  zolpidem (AMBIEN) 5 MG tablet Take 10 mg by mouth at bedtime as needed. 10/19/19   [provider]      Allergies    Prednisone    Review of Systems   Review of Systems  Respiratory:  Positive for shortness of breath.     Physical Exam Updated Vital Signs BP (!) 116/93 (BP Location: Right Arm)   Pulse 92   Temp 98.7 F (37.1 C) (Oral)   Resp 18   Ht '5\' 9"'$  (1.753 m)   Wt 75.3 kg   SpO2 97%   BMI 24.51 kg/m  Physical Exam  ED Results / Procedures / Treatments   Labs (all labs ordered are listed, but only  abnormal results are displayed) Labs Reviewed  RESP PANEL BY RT-PCR (FLU A&B, COVID) [id number]    EKG None  Radiology DG Chest Port 1 View  Result Date: 04/07/2022 CLINICAL DATA:  Shortness of breath and chest contrast strain. EXAM: PORTABLE CHEST 1 VIEW COMPARISON:  Chest radiograph dated June 06, 2021. FINDINGS: The heart size and mediastinal contours are within normal limits. Both lungs are clear. The visualized skeletal structures are unremarkable. IMPRESSION: No active disease. Electronically Signed   By: Keane Police D.O.   On: 04/07/2022 10:46    Procedures Procedures  {Document cardiac monitor, telemetry assessment procedure when appropriate:1}  Medications Ordered in ED Medications - No data to display  ED Course/ Medical Decision Making/ A&P                           Medical Decision Making Amount and/or Complexity of Data Reviewed Labs: ordered. Radiology: ordered.  Risk Prescription drug management.   ***  {Document critical care time when appropriate:1} {Document review of labs and clinical decision tools ie heart score, Chads2Vasc2 etc:1}  {Document your independent review of radiology images, and any outside records:1} {Document your discussion with family members, caretakers, and with consultants:1} {Document social  determinants of health affecting pt's care:1} {Document your decision making why or why not admission, treatments were needed:1} Final Clinical Impression(s) / ED Diagnoses Final diagnoses:  None    Rx / DC Orders ED Discharge Orders     None

## 2022-04-07 NOTE — ED Triage Notes (Addendum)
Pt states chest feels constricted and has been experiencing shortness of breath. Pt has congestion as well. Pt is a kidney transplant pt, prostate CA pt.  Pt is on eliquis. Pt states he has not had a BM since Wednesday.

## 2022-04-07 NOTE — Discharge Instructions (Addendum)
Your symptoms are consistent with acute bronchitis. Recommend symptomatic management, antitussives, and PCP follow-up.   IMPRESSION:  1. No active cardiopulmonary abnormalities.  2. Calcifications of the pancreas noted which may reflect sequelae  of chronic pancreatitis.  3. 2.6 cm incidental right thyroid nodule. Recommend nonemergent  thyroid US.  Reference: J Am Coll Radiol. 2015 Feb;12(2): 143-50  4. 3 mm right solid pulmonary nodule within the upper lobe. Per  Fleischner Society Guidelines, if patient is low risk for  malignancy, no routine follow-up imaging is recommended; if patient  is high risk for malignancy, a non-contrast Chest CT at 12 months is  optional. If performed and the nodule is stable at 12 months, no  further follow-up is recommended.  These guidelines do not apply to immunocompromised patients and  patients with cancer. Follow up in patients with significant  comorbidities as clinically warranted. For lung cancer screening,  adhere to Lung-RADS guidelines. Reference: Radiology. 2017;  284(1):228-43.  5. Aortic Atherosclerosis (ICD10-I70.0).

## 2022-04-09 ENCOUNTER — Encounter: Payer: Self-pay | Admitting: Urology

## 2022-04-09 NOTE — Progress Notes (Addendum)
Telephone appointment. I verified patient's identity and began nursing interview. Patient reports cycles of constipation/diarrhea (mildly softer stools). No other issues reported at this time.  Meaningful use complete. I-PSS score of 2-mild. No urinary management medications. Urology appt-Oct, 2023  Reminded patient of his 11:30am-04/10/22 telephone appointment w/ Ashlyn Bruning PA-C. I left my extension [phone] in case patient needs anything. Patient verbalized understanding.  Patient contact [phone]

## 2022-04-10 ENCOUNTER — Ambulatory Visit
Admission: RE | Admit: 2022-04-10 | Discharge: 2022-04-10 | Disposition: A | Discharge status: Home / Self Care | Payer: Commercial Managed Care - PPO | Source: Ambulatory Visit | Attending: Urology | Admitting: Urology

## 2022-04-10 DIAGNOSIS — C61 Malignant neoplasm of prostate: Secondary | ICD-10-CM

## 2022-04-11 ENCOUNTER — Ambulatory Visit: Payer: Self-pay | Admitting: Urology

## 2022-05-15 ENCOUNTER — Encounter: Payer: Self-pay | Admitting: *Deleted

## 2022-05-16 ENCOUNTER — Telehealth: Payer: Self-pay | Admitting: *Deleted

## 2022-05-16 NOTE — Telephone Encounter (Signed)
Scheduled appointment per 10/24 scheduling message. Patient is aware. 

## 2022-05-24 ENCOUNTER — Encounter (INDEPENDENT_AMBULATORY_CARE_PROVIDER_SITE_OTHER): Payer: Commercial Managed Care - PPO | Admitting: Ophthalmology

## 2022-06-01 ENCOUNTER — Inpatient Hospital Stay: Payer: Commercial Managed Care - PPO | Attending: Adult Health | Admitting: *Deleted

## 2022-06-01 ENCOUNTER — Encounter: Payer: Self-pay | Admitting: *Deleted

## 2022-06-01 DIAGNOSIS — C61 Malignant neoplasm of prostate: Secondary | ICD-10-CM

## 2022-06-01 NOTE — Progress Notes (Signed)
2 Identifiers used for verification purposes. No vitals were taken as this was not an in-person visit. Pt denies pain and says fatigue has gotten better since stopping the Orgovyx. Pt said that medication was discontinued on Nov.2nd. Pt says the urinary urgency has resolved and the frequency as well. He is only getting up maybe once to urinate at nighttime. Urine stream is getting better not as weak.No issues with bowels.  Last visit with PCP was in May 2023. Last colonoscopy was in 2021. Pt is exercising daily by walking the dog and getting his steps in , 5000-8000 steps. Goal is 10,000 steps. Pt says he could do better with eating vegetables and fruit. We reviewed the " [employer]".  Pt denies smoking and only drinks on special occasions. Vaccinations reviewed and updated. SCP reviewed and completed.

## 2022-08-09 ENCOUNTER — Encounter (HOSPITAL_BASED_OUTPATIENT_CLINIC_OR_DEPARTMENT_OTHER): Payer: Self-pay | Admitting: Emergency Medicine

## 2022-08-09 ENCOUNTER — Emergency Department (HOSPITAL_BASED_OUTPATIENT_CLINIC_OR_DEPARTMENT_OTHER)
Admission: EM | Admit: 2022-08-09 | Discharge: 2022-08-09 | Disposition: A | Discharge status: Home / Self Care | Payer: Commercial Managed Care - PPO | Attending: Emergency Medicine | Admitting: Emergency Medicine

## 2022-08-09 ENCOUNTER — Other Ambulatory Visit: Payer: Self-pay

## 2022-08-09 DIAGNOSIS — J069 Acute upper respiratory infection, unspecified: Secondary | ICD-10-CM

## 2022-08-09 DIAGNOSIS — Z940 Kidney transplant status: Secondary | ICD-10-CM | POA: Diagnosis not present

## 2022-08-09 DIAGNOSIS — Z20822 Contact with and (suspected) exposure to covid-19: Secondary | ICD-10-CM | POA: Diagnosis not present

## 2022-08-09 DIAGNOSIS — N184 Chronic kidney disease, stage 4 (severe): Secondary | ICD-10-CM | POA: Insufficient documentation

## 2022-08-09 DIAGNOSIS — Z79899 Other long term (current) drug therapy: Secondary | ICD-10-CM | POA: Diagnosis not present

## 2022-08-09 DIAGNOSIS — I129 Hypertensive chronic kidney disease with stage 1 through stage 4 chronic kidney disease, or unspecified chronic kidney disease: Secondary | ICD-10-CM | POA: Diagnosis not present

## 2022-08-09 DIAGNOSIS — Z7901 Long term (current) use of anticoagulants: Secondary | ICD-10-CM | POA: Insufficient documentation

## 2022-08-09 DIAGNOSIS — I10 Essential (primary) hypertension: Secondary | ICD-10-CM

## 2022-08-09 DIAGNOSIS — R059 Cough, unspecified: Secondary | ICD-10-CM | POA: Diagnosis present

## 2022-08-09 HISTORY — DX: Kidney transplant status: Z94.0

## 2022-08-09 HISTORY — DX: Acute embolism and thrombosis of unspecified deep veins of unspecified lower extremity: I82.409

## 2022-08-09 LAB — BASIC METABOLIC PANEL
Anion gap: 9 (ref 5–15)
BUN: 32 mg/dL — ABNORMAL HIGH (ref 8–23)
CO2: 22 mmol/L (ref 22–32)
Calcium: 9.7 mg/dL (ref 8.9–10.3)
Chloride: 106 mmol/L (ref 98–111)
Creatinine, Ser: 2.62 mg/dL — ABNORMAL HIGH (ref 0.61–1.24)
GFR, Estimated: 27 mL/min — ABNORMAL LOW (ref >60)
Glucose, Bld: 89 mg/dL (ref 70–99)
Potassium: 4.8 mmol/L (ref 3.5–5.1)
Sodium: 137 mmol/L (ref 135–145)

## 2022-08-09 LAB — RESP PANEL BY RT-PCR (RSV, FLU A&B, COVID)  RVPGX2
Influenza A by PCR: NEGATIVE
Influenza B by PCR: NEGATIVE
Resp Syncytial Virus by PCR: NEGATIVE
SARS Coronavirus 2 by RT PCR: NEGATIVE

## 2022-08-09 LAB — CBC
HCT: 38.3 % — ABNORMAL LOW (ref 39.0–52.0)
Hemoglobin: 12.7 g/dL — ABNORMAL LOW (ref 13.0–17.0)
MCH: 29.6 pg (ref 26.0–34.0)
MCHC: 33.2 g/dL (ref 30.0–36.0)
MCV: 89.3 fL (ref 80.0–100.0)
Platelets: 153 10*3/uL (ref 150–400)
RBC: 4.29 MIL/uL (ref 4.22–5.81)
RDW: 14 % (ref 11.5–15.5)
WBC: 3.1 10*3/uL — ABNORMAL LOW (ref 4.0–10.5)
nRBC: 0 % (ref 0.0–0.2)

## 2022-08-09 NOTE — ED Triage Notes (Signed)
2 to 3 days ago ,cough/congested/raspy throat. Coworker has RSV, pt is kidney transplant recipient pt has had RSV vaccine. Transplant coordinator states he should check his kidney function.

## 2022-08-09 NOTE — Discharge Instructions (Signed)
GFR here today's 27 so a little bit worse than your baseline that you described.  Also blood pressure elevated here today.  Respiratory panel negative for COVID influenza and RSV.  Would share your lab results from the kidney function standpoint with your transplant coordinator at [facility].  Also would recommend trending your blood pressure to make sure that this is not a new hive that would require some intervention.  Also very important to return for any new or worse symptoms.  Particular if you start feeling short of breath or start running fevers.

## 2022-08-09 NOTE — ED Provider Notes (Signed)
[facility] EMERGENCY DEPT Provider Note   CSN: [id number] Arrival date & time: 08/09/22  1015     History  No chief complaint on file.   Jordan Hanna is a 62 y.o. male.  Patient is a kidney transplant patient done at [facility] in March 2022.  Had just significant atrophy of the transplanted kidney so patient's baseline GFR here of late has been 30 and creatinine is 2.3.  Patient also has a history of hypertension that he is on medication for.  Patient's had an upper respiratory infection with cough congestion and raspy throat for the past 3 days.  Had a coworker that was positive for RSV.  Patient from the upper respiratory infection feels fine oxygen saturations 100% temp 98.9.  Respiration 16 pulse 69.  Past medical history significant for chronic kidney disease status post of the kidney transplant as mentioned.  Hypertension systemic lupus prostate cancer history of deep vein thrombosis.  Patient's medications are significant for being on Eliquis.  And immunosuppressive therapy.  Patient was on hydroxychloroquine in the past.  And takes lisinopril.  Patient is a nontobacco user.       Home Medications Prior to Admission medications   Medication Sig Start Date End Date Taking? Authorizing Provider  Belatacept (NULOJIX IV) Inject into the vein. Takes the 12th of every month-infusion    [provider]  ELIQUIS 5 MG TABS tablet Take 1 tablet (5 mg total) by mouth 2 (two) times daily. Take 1/2 tablet or 2.5 mg by mouth 2 (two) times daily 12/28/21   Festus Aloe, MD  eplerenone (INSPRA) 50 MG tablet Take 50 mg by mouth daily. 09/06/20   [provider]  hydroxychloroquine (PLAQUENIL) 200 MG tablet Take 200 mg by mouth 2 (two) times daily. Takes at 1200 pm 06/21/16   [provider]  lisinopril (ZESTRIL) 5 MG tablet Take 5 mg by mouth daily.    [provider]  mirtazapine (REMERON) 30 MG tablet Take 30 mg by mouth at bedtime. 08/04/19    [provider]  sirolimus (RAPAMUNE) 1 MG tablet Take 1 mg by mouth daily. 05/18/21   [provider]  zolpidem (AMBIEN) 5 MG tablet Take 10 mg by mouth at bedtime as needed. 10/19/19   [provider]      Allergies    Prednisone    Review of Systems   Review of Systems  Constitutional:  Negative for chills and fever.  HENT:  Positive for congestion and sore throat. Negative for ear pain.   Eyes:  Negative for pain and visual disturbance.  Respiratory:  Positive for cough. Negative for shortness of breath.   Cardiovascular:  Negative for chest pain and palpitations.  Gastrointestinal:  Negative for abdominal pain and vomiting.  Genitourinary:  Negative for dysuria and hematuria.  Musculoskeletal:  Negative for arthralgias and back pain.  Skin:  Negative for color change and rash.  Neurological:  Negative for seizures and syncope.  All other systems reviewed and are negative.   Physical Exam Updated Vital Signs BP (!) 153/117 (BP Location: Right Arm)   Pulse 69   Temp 98.9 F (37.2 C) (Oral)   Resp 16   SpO2 100%  Physical Exam Vitals and nursing note reviewed.  Constitutional:      General: He is not in acute distress.    Appearance: Normal appearance. He is well-developed. He is not ill-appearing or toxic-appearing.  HENT:     Head: Normocephalic and atraumatic.  Mouth/Throat:     Mouth: Mucous membranes are moist.     Pharynx: Oropharynx is clear.  Eyes:     Extraocular Movements: Extraocular movements intact.     Conjunctiva/sclera: Conjunctivae normal.     Pupils: Pupils are equal, round, and reactive to light.  Cardiovascular:     Rate and Rhythm: Normal rate and regular rhythm.     Heart sounds: No murmur heard. Pulmonary:     Effort: Pulmonary effort is normal. No respiratory distress.     Breath sounds: Normal breath sounds. No wheezing, rhonchi or rales.  Abdominal:     Palpations: Abdomen is soft.     Tenderness: There is  no abdominal tenderness.  Musculoskeletal:        General: No swelling.     Cervical back: Normal range of motion and neck supple.  Skin:    General: Skin is warm and dry.     Capillary Refill: Capillary refill takes less than 2 seconds.  Neurological:     General: No focal deficit present.     Mental Status: He is alert and oriented to person, place, and time.  Psychiatric:        Mood and Affect: Mood normal.     ED Results / Procedures / Treatments   Labs (all labs ordered are listed, but only abnormal results are displayed) Labs Reviewed  CBC - Abnormal; Notable for the following components:      Result Value   WBC 3.1 (*)    Hemoglobin 12.7 (*)    HCT 38.3 (*)    All other components within normal limits  BASIC METABOLIC PANEL - Abnormal; Notable for the following components:   BUN 32 (*)    Creatinine, Ser 2.62 (*)    GFR, Estimated 27 (*)    All other components within normal limits  RESP PANEL BY RT-PCR (RSV, FLU A&B, COVID)  [id number]    EKG None  Radiology No results found.  Procedures Procedures    Medications Ordered in ED Medications - No data to display  ED Course/ Medical Decision Making/ A&P                             Medical Decision Making Amount and/or Complexity of Data Reviewed Labs: ordered.   Respiratory panel negative for RSV COVID influenza.  Lungs are very clear no respiratory distress oxygen levels are very good on room air 100%.  From the upper respiratory infection patient nontoxic no acute distress at this time.  See no indication for chest x-ray at this time.  Patient's renal function little worse than his pronounced baseline.  BUN 32 creatinine 2.62 for a GFR of 27.  Electrolytes are normal.  CBC white count 3.1 goes along with viral illness.  Hemoglobin 12.7 platelets 153 K.  Patient blood pressure elevated here that will need to be trended and followed carefully.  Will have patient follow back up with his transplant  coordinator with the new electrolytes and kidney function results.  Patient given precautions return for any new or worse symptoms.   Final Clinical Impression(s) / ED Diagnoses Final diagnoses:  Upper respiratory tract infection, unspecified type  Kidney transplant recipient  Primary hypertension  CKD (chronic kidney disease) stage 4, GFR 15-29 ml/min [facility])    Rx / DC Orders ED Discharge Orders     None         Fredia Sorrow, MD 08/09/22 1133

## 2022-08-21 ENCOUNTER — Other Ambulatory Visit: Payer: Self-pay | Admitting: Neurosurgery

## 2022-08-21 DIAGNOSIS — M4802 Spinal stenosis, cervical region: Secondary | ICD-10-CM

## 2022-08-21 DIAGNOSIS — I82441 Acute embolism and thrombosis of right tibial vein: Secondary | ICD-10-CM

## 2022-09-05 ENCOUNTER — Ambulatory Visit
Admission: RE | Admit: 2022-09-05 | Discharge: 2022-09-05 | Disposition: A | Discharge status: Home / Self Care | Payer: Medicare Other | Source: Ambulatory Visit | Attending: Neurosurgery | Admitting: Neurosurgery

## 2022-09-05 ENCOUNTER — Ambulatory Visit
Admission: RE | Admit: 2022-09-05 | Discharge: 2022-09-05 | Disposition: A | Discharge status: Home / Self Care | Payer: Commercial Managed Care - PPO | Source: Ambulatory Visit | Attending: Neurosurgery | Admitting: Neurosurgery

## 2022-09-05 DIAGNOSIS — I82441 Acute embolism and thrombosis of right tibial vein: Secondary | ICD-10-CM

## 2022-09-05 DIAGNOSIS — M4802 Spinal stenosis, cervical region: Secondary | ICD-10-CM

## 2022-11-12 ENCOUNTER — Other Ambulatory Visit (HOSPITAL_BASED_OUTPATIENT_CLINIC_OR_DEPARTMENT_OTHER): Payer: Self-pay

## 2022-11-12 ENCOUNTER — Other Ambulatory Visit: Payer: Self-pay

## 2022-11-12 ENCOUNTER — Encounter (HOSPITAL_COMMUNITY): Payer: Self-pay

## 2022-11-12 ENCOUNTER — Emergency Department (HOSPITAL_BASED_OUTPATIENT_CLINIC_OR_DEPARTMENT_OTHER)
Admission: EM | Admit: 2022-11-12 | Discharge: 2022-11-12 | Disposition: A | Discharge status: Home / Self Care | Payer: Commercial Managed Care - PPO | Attending: Emergency Medicine | Admitting: Emergency Medicine

## 2022-11-12 ENCOUNTER — Encounter (HOSPITAL_BASED_OUTPATIENT_CLINIC_OR_DEPARTMENT_OTHER): Payer: Self-pay

## 2022-11-12 DIAGNOSIS — Z940 Kidney transplant status: Secondary | ICD-10-CM | POA: Diagnosis not present

## 2022-11-12 DIAGNOSIS — Z7901 Long term (current) use of anticoagulants: Secondary | ICD-10-CM | POA: Diagnosis not present

## 2022-11-12 DIAGNOSIS — R197 Diarrhea, unspecified: Secondary | ICD-10-CM | POA: Insufficient documentation

## 2022-11-12 LAB — COMPREHENSIVE METABOLIC PANEL
ALT: 16 U/L (ref 0–44)
AST: 18 U/L (ref 15–41)
Albumin: 4.2 g/dL (ref 3.5–5.0)
Alkaline Phosphatase: 122 U/L (ref 38–126)
Anion gap: 12 (ref 5–15)
BUN: 30 mg/dL — ABNORMAL HIGH (ref 8–23)
CO2: 19 mmol/L — ABNORMAL LOW (ref 22–32)
Calcium: 9.4 mg/dL (ref 8.9–10.3)
Chloride: 105 mmol/L (ref 98–111)
Creatinine, Ser: 2.67 mg/dL — ABNORMAL HIGH (ref 0.61–1.24)
GFR, Estimated: 26 mL/min — ABNORMAL LOW (ref >60)
Glucose, Bld: 101 mg/dL — ABNORMAL HIGH (ref 70–99)
Potassium: 3.8 mmol/L (ref 3.5–5.1)
Sodium: 136 mmol/L (ref 135–145)
Total Bilirubin: 1.2 mg/dL (ref 0.3–1.2)
Total Protein: 7.6 g/dL (ref 6.5–8.1)

## 2022-11-12 LAB — CBC WITH DIFFERENTIAL/PLATELET
Abs Immature Granulocytes: 0.02 10*3/uL (ref 0.00–0.07)
Basophils Absolute: 0 10*3/uL (ref 0.0–0.1)
Basophils Relative: 1 %
Eosinophils Absolute: 0.1 10*3/uL (ref 0.0–0.5)
Eosinophils Relative: 1 %
HCT: 34.7 % — ABNORMAL LOW (ref 39.0–52.0)
Hemoglobin: 11.6 g/dL — ABNORMAL LOW (ref 13.0–17.0)
Immature Granulocytes: 0 %
Lymphocytes Relative: 18 %
Lymphs Abs: 0.8 10*3/uL (ref 0.7–4.0)
MCH: 29.4 pg (ref 26.0–34.0)
MCHC: 33.4 g/dL (ref 30.0–36.0)
MCV: 88.1 fL (ref 80.0–100.0)
Monocytes Absolute: 1.2 10*3/uL — ABNORMAL HIGH (ref 0.1–1.0)
Monocytes Relative: 28 %
Neutro Abs: 2.3 10*3/uL (ref 1.7–7.7)
Neutrophils Relative %: 52 %
Platelets: 212 10*3/uL (ref 150–400)
RBC: 3.94 MIL/uL — ABNORMAL LOW (ref 4.22–5.81)
RDW: 14.5 % (ref 11.5–15.5)
WBC: 4.5 10*3/uL (ref 4.0–10.5)
nRBC: 0 % (ref 0.0–0.2)

## 2022-11-12 LAB — LIPASE, BLOOD: Lipase: 22 U/L (ref 11–51)

## 2022-11-12 MED ORDER — SODIUM CHLORIDE 0.9 % IV BOLUS
2000.0000 mL | Freq: Once | INTRAVENOUS | Status: AC
  Administered 2022-11-12: 2000 mL via INTRAVENOUS

## 2022-11-12 NOTE — ED Provider Notes (Signed)
[place] EMERGENCY DEPARTMENT AT [facility] Provider Note   CSN: [id number] Arrival date & time: 11/12/22  5784     History  Chief Complaint  Patient presents with   Diarrhea    Jordan Hanna is a 62 y.o. male.  Patient is here with diarrhea for the last few days.  History of renal transplant on immunocompromise location.  History of blood clot on Eliquis.  He was having some constipation issues after neck surgery earlier this month and his primary care doctor put him on Flagyl.  He has not been having any abdominal pain.  No nausea or vomiting.  He has been able to drink okay but he is just having very loose stools that are burning.  He is having maybe 4-5 episodes a day.  He is mostly concerned about dehydration and his kidney function.  Still not having any abdominal pain.  No fevers or chills.  No sick contacts or suspicious food intake.  Seems like diarrhea started after he started taking Flagyl.  The history is provided by the patient.       Home Medications Prior to Admission medications   Medication Sig Start Date End Date Taking? Authorizing Provider  Belatacept (NULOJIX IV) Inject into the vein. Takes the 12th of every month-infusion    [provider]  ELIQUIS 5 MG TABS tablet Take 1 tablet (5 mg total) by mouth 2 (two) times daily. Take 1/2 tablet or 2.5 mg by mouth 2 (two) times daily 12/28/21   Jerilee Field, MD  eplerenone (INSPRA) 50 MG tablet Take 50 mg by mouth daily. 09/06/20   [provider]  hydroxychloroquine (PLAQUENIL) 200 MG tablet Take 200 mg by mouth 2 (two) times daily. Takes at 1200 pm 06/21/16   [provider]  lisinopril (ZESTRIL) 5 MG tablet Take 5 mg by mouth daily.    [provider]  mirtazapine (REMERON) 30 MG tablet Take 30 mg by mouth at bedtime. 08/04/19   [provider]  sirolimus (RAPAMUNE) 1 MG tablet Take 1 mg by mouth daily. 05/18/21   [provider]  zolpidem (AMBIEN)  5 MG tablet Take 10 mg by mouth at bedtime as needed. 10/19/19   [provider]      Allergies    Prednisone    Review of Systems   Review of Systems  Physical Exam Updated Vital Signs BP (!) 135/92   Pulse 79   Temp 98.3 F (36.8 C) (Oral)   Resp 16   Ht 5\' 9"  (1.753 m)   Wt 75.3 kg   SpO2 100%   BMI 24.51 kg/m  Physical Exam Vitals and nursing note reviewed.  Constitutional:      General: He is not in acute distress.    Appearance: He is well-developed. He is not ill-appearing.  HENT:     Head: Normocephalic and atraumatic.     Mouth/Throat:     Mouth: Mucous membranes are moist.  Eyes:     Extraocular Movements: Extraocular movements intact.     Conjunctiva/sclera: Conjunctivae normal.     Pupils: Pupils are equal, round, and reactive to light.  Cardiovascular:     Rate and Rhythm: Normal rate and regular rhythm.     Pulses: Normal pulses.     Heart sounds: Normal heart sounds. No murmur heard. Pulmonary:     Effort: Pulmonary effort is normal. No respiratory distress.     Breath sounds: Normal breath sounds.  Abdominal:     Palpations:  Abdomen is soft.     Tenderness: There is no abdominal tenderness.  Musculoskeletal:        General: No swelling.     Cervical back: Normal range of motion and neck supple.  Skin:    General: Skin is warm and dry.     Capillary Refill: Capillary refill takes less than 2 seconds.  Neurological:     General: No focal deficit present.     Mental Status: He is alert.  Psychiatric:        Mood and Affect: Mood normal.     ED Results / Procedures / Treatments   Labs (all labs ordered are listed, but only abnormal results are displayed) Labs Reviewed  CBC WITH DIFFERENTIAL/PLATELET - Abnormal; Notable for the following components:      Result Value   RBC 3.94 (*)    Hemoglobin 11.6 (*)    HCT 34.7 (*)    Monocytes Absolute 1.2 (*)    All other components within normal limits  COMPREHENSIVE METABOLIC PANEL -  Abnormal; Notable for the following components:   CO2 19 (*)    Glucose, Bld 101 (*)    BUN 30 (*)    Creatinine, Ser 2.67 (*)    GFR, Estimated 26 (*)    All other components within normal limits  LIPASE, BLOOD    EKG None  Radiology No results found.  Procedures Procedures    Medications Ordered in ED Medications  sodium chloride 0.9 % bolus 2,000 mL (2,000 mLs Intravenous New Bag/Given 11/12/22 1009)    ED Course/ Medical Decision Making/ A&P                             Medical Decision Making Amount and/or Complexity of Data Reviewed Labs: ordered.   [name] is here with diarrhea and concern for dehydration.  Normal vitals.  No fever.  History of renal transplant on immunosuppressive's, history of blood clot on Eliquis.  Symptoms of diarrhea now for the last couple days.  Burning type pain with any has bowel movements but is not having any abdominal pain.  No nausea or vomiting.  Has been able to hydrate by mouth but he is concerned that he is not keeping up.  Abdominal exam is benign.  He is overall very well-appearing.  Differential diagnosis likely viral process versus foodborne process, symptoms seem to have started after he was started on Flagyl for constipation.  Will have him stop Flagyl.  Will check for C. difficile and stool pathogen panel if he is able to give [place] a stool sample while being here.  Will evaluate for dehydration, electrolyte abnormalities.  It seems that his baseline kidney function since his transplant is around 2.5-2.7.  Will give 2 L of IV fluids and reevaluate.  Per my review and interpretation of labs, creatinine is at baseline.  No other significant anemia or electrolyte abnormality.  Patient feeling better.  Discharged in good condition.  Understands return precautions.  Overall suspect viral process.  He was unable to give a stool sample here in the ED.  Recommend that if he continue to have diarrhea that he try to provide a sample to his  primary care doctor.  This chart was dictated using voice recognition software.  Despite best efforts to proofread,  errors can occur which can change the documentation meaning.         Final Clinical Impression(s) / ED Diagnoses Final diagnoses:  Diarrhea, unspecified type    Rx / DC Orders ED Discharge Orders     None         Virgina Norfolk, DO 11/12/22 1158

## 2022-11-12 NOTE — ED Triage Notes (Signed)
Patient states had surgery  April 1 and became constipated then on Wednesday began having diarrhea. No nausea or vomiting.  No abd pain

## 2022-11-12 NOTE — Discharge Instructions (Signed)
Lab work today is unremarkable.  Consider Imodium if you continue to have more than 5 stools a day.

## 2022-11-12 NOTE — ED Notes (Signed)
Pt given discharge instructions. Opportunities given for questions. Pt verbalizes understanding. PIV removed x1. Aeisha Minarik R, RN 

## 2022-11-19 ENCOUNTER — Inpatient Hospital Stay (HOSPITAL_BASED_OUTPATIENT_CLINIC_OR_DEPARTMENT_OTHER)
Admission: EM | Admit: 2022-11-19 | Discharge: 2022-11-23 | DRG: 872 | Disposition: A | Discharge status: Home / Self Care | Payer: Commercial Managed Care - PPO | Attending: Internal Medicine | Admitting: Internal Medicine

## 2022-11-19 ENCOUNTER — Encounter (HOSPITAL_BASED_OUTPATIENT_CLINIC_OR_DEPARTMENT_OTHER): Payer: Self-pay | Admitting: Emergency Medicine

## 2022-11-19 ENCOUNTER — Other Ambulatory Visit: Payer: Self-pay

## 2022-11-19 ENCOUNTER — Emergency Department (HOSPITAL_BASED_OUTPATIENT_CLINIC_OR_DEPARTMENT_OTHER): Payer: Commercial Managed Care - PPO

## 2022-11-19 DIAGNOSIS — Z888 Allergy status to other drugs, medicaments and biological substances status: Secondary | ICD-10-CM

## 2022-11-19 DIAGNOSIS — N184 Chronic kidney disease, stage 4 (severe): Secondary | ICD-10-CM | POA: Diagnosis present

## 2022-11-19 DIAGNOSIS — Z7901 Long term (current) use of anticoagulants: Secondary | ICD-10-CM

## 2022-11-19 DIAGNOSIS — E872 Acidosis, unspecified: Secondary | ICD-10-CM | POA: Diagnosis not present

## 2022-11-19 DIAGNOSIS — I10 Essential (primary) hypertension: Secondary | ICD-10-CM | POA: Diagnosis present

## 2022-11-19 DIAGNOSIS — A0472 Enterocolitis due to Clostridium difficile, not specified as recurrent: Secondary | ICD-10-CM | POA: Diagnosis present

## 2022-11-19 DIAGNOSIS — R197 Diarrhea, unspecified: Secondary | ICD-10-CM | POA: Diagnosis not present

## 2022-11-19 DIAGNOSIS — D84821 Immunodeficiency due to drugs: Secondary | ICD-10-CM | POA: Diagnosis present

## 2022-11-19 DIAGNOSIS — A09 Infectious gastroenteritis and colitis, unspecified: Principal | ICD-10-CM

## 2022-11-19 DIAGNOSIS — F32A Depression, unspecified: Secondary | ICD-10-CM | POA: Diagnosis present

## 2022-11-19 DIAGNOSIS — I129 Hypertensive chronic kidney disease with stage 1 through stage 4 chronic kidney disease, or unspecified chronic kidney disease: Secondary | ICD-10-CM | POA: Diagnosis present

## 2022-11-19 DIAGNOSIS — Z833 Family history of diabetes mellitus: Secondary | ICD-10-CM

## 2022-11-19 DIAGNOSIS — A414 Sepsis due to anaerobes: Secondary | ICD-10-CM | POA: Diagnosis not present

## 2022-11-19 DIAGNOSIS — Z86718 Personal history of other venous thrombosis and embolism: Secondary | ICD-10-CM

## 2022-11-19 DIAGNOSIS — Z940 Kidney transplant status: Secondary | ICD-10-CM

## 2022-11-19 DIAGNOSIS — E861 Hypovolemia: Secondary | ICD-10-CM | POA: Diagnosis present

## 2022-11-19 DIAGNOSIS — Z8546 Personal history of malignant neoplasm of prostate: Secondary | ICD-10-CM

## 2022-11-19 DIAGNOSIS — D631 Anemia in chronic kidney disease: Secondary | ICD-10-CM | POA: Diagnosis present

## 2022-11-19 DIAGNOSIS — M3214 Glomerular disease in systemic lupus erythematosus: Secondary | ICD-10-CM | POA: Diagnosis present

## 2022-11-19 DIAGNOSIS — M329 Systemic lupus erythematosus, unspecified: Secondary | ICD-10-CM | POA: Diagnosis present

## 2022-11-19 DIAGNOSIS — E871 Hypo-osmolality and hyponatremia: Secondary | ICD-10-CM | POA: Diagnosis present

## 2022-11-19 DIAGNOSIS — E876 Hypokalemia: Secondary | ICD-10-CM | POA: Diagnosis not present

## 2022-11-19 DIAGNOSIS — K529 Noninfective gastroenteritis and colitis, unspecified: Secondary | ICD-10-CM

## 2022-11-19 DIAGNOSIS — Z79899 Other long term (current) drug therapy: Secondary | ICD-10-CM

## 2022-11-19 DIAGNOSIS — Z8249 Family history of ischemic heart disease and other diseases of the circulatory system: Secondary | ICD-10-CM

## 2022-11-19 DIAGNOSIS — Z923 Personal history of irradiation: Secondary | ICD-10-CM

## 2022-11-19 LAB — COMPREHENSIVE METABOLIC PANEL
ALT: 13 U/L (ref 0–44)
AST: 18 U/L (ref 15–41)
Albumin: 3.8 g/dL (ref 3.5–5.0)
Alkaline Phosphatase: 100 U/L (ref 38–126)
Anion gap: 12 (ref 5–15)
BUN: 34 mg/dL — ABNORMAL HIGH (ref 8–23)
CO2: 19 mmol/L — ABNORMAL LOW (ref 22–32)
Calcium: 9.1 mg/dL (ref 8.9–10.3)
Chloride: 100 mmol/L (ref 98–111)
Creatinine, Ser: 2.68 mg/dL — ABNORMAL HIGH (ref 0.61–1.24)
GFR, Estimated: 26 mL/min — ABNORMAL LOW (ref >60)
Glucose, Bld: 116 mg/dL — ABNORMAL HIGH (ref 70–99)
Potassium: 4.4 mmol/L (ref 3.5–5.1)
Sodium: 131 mmol/L — ABNORMAL LOW (ref 135–145)
Total Bilirubin: 1.6 mg/dL — ABNORMAL HIGH (ref 0.3–1.2)
Total Protein: 7.2 g/dL (ref 6.5–8.1)

## 2022-11-19 LAB — CBC
HCT: 33.5 % — ABNORMAL LOW (ref 39.0–52.0)
Hemoglobin: 11.1 g/dL — ABNORMAL LOW (ref 13.0–17.0)
MCH: 28.9 pg (ref 26.0–34.0)
MCHC: 33.1 g/dL (ref 30.0–36.0)
MCV: 87.2 fL (ref 80.0–100.0)
Platelets: 258 10*3/uL (ref 150–400)
RBC: 3.84 MIL/uL — ABNORMAL LOW (ref 4.22–5.81)
RDW: 14.5 % (ref 11.5–15.5)
WBC: 14.5 10*3/uL — ABNORMAL HIGH (ref 4.0–10.5)
nRBC: 0 % (ref 0.0–0.2)

## 2022-11-19 LAB — C DIFFICILE QUICK SCREEN W PCR REFLEX
C Diff antigen: POSITIVE — AB
C Diff interpretation: DETECTED
C Diff toxin: POSITIVE — AB

## 2022-11-19 LAB — OCCULT BLOOD X 1 CARD TO LAB, STOOL: Fecal Occult Bld: POSITIVE — AB

## 2022-11-19 LAB — LIPASE, BLOOD: Lipase: 13 U/L (ref 11–51)

## 2022-11-19 MED ORDER — LISINOPRIL 5 MG PO TABS: 5.0000 mg | ORAL_TABLET | Freq: Every day | ORAL | Status: DC

## 2022-11-19 MED ORDER — ACETAMINOPHEN 650 MG RE SUPP: 650.0000 mg | Freq: Four times a day (QID) | RECTAL | Status: DC | PRN

## 2022-11-19 MED ORDER — ONDANSETRON HCL 4 MG/2ML IJ SOLN: 4.0000 mg | Freq: Four times a day (QID) | INTRAMUSCULAR | Status: DC | PRN

## 2022-11-19 MED ORDER — APIXABAN 5 MG PO TABS: 5.0000 mg | ORAL_TABLET | Freq: Two times a day (BID) | ORAL | Status: DC

## 2022-11-19 MED ORDER — SODIUM CHLORIDE 0.9 % IV BOLUS
1000.0000 mL | Freq: Once | INTRAVENOUS | Status: AC
  Administered 2022-11-19: 1000 mL via INTRAVENOUS

## 2022-11-19 MED ORDER — ACETAMINOPHEN 325 MG PO TABS
650.0000 mg | ORAL_TABLET | Freq: Four times a day (QID) | ORAL | Status: DC | PRN
  Administered 2022-11-19 – 2022-11-23 (×7): 650 mg via ORAL
  Filled 2022-11-19 (×8): qty 2

## 2022-11-19 MED ORDER — HYDROXYCHLOROQUINE SULFATE 200 MG PO TABS
200.0000 mg | ORAL_TABLET | Freq: Two times a day (BID) | ORAL | Status: DC
  Administered 2022-11-19 – 2022-11-23 (×8): 200 mg via ORAL
  Filled 2022-11-19 (×8): qty 1

## 2022-11-19 MED ORDER — SODIUM CHLORIDE 0.9 % IV SOLN: INTRAVENOUS | Status: AC

## 2022-11-19 MED ORDER — LISINOPRIL 5 MG PO TABS
5.0000 mg | ORAL_TABLET | Freq: Every day | ORAL | Status: DC
  Administered 2022-11-19 – 2022-11-21 (×3): 5 mg via ORAL
  Filled 2022-11-19 (×3): qty 1

## 2022-11-19 MED ORDER — APIXABAN 2.5 MG PO TABS
2.5000 mg | ORAL_TABLET | Freq: Two times a day (BID) | ORAL | Status: DC
  Administered 2022-11-19 – 2022-11-23 (×8): 2.5 mg via ORAL
  Filled 2022-11-19 (×8): qty 1

## 2022-11-19 MED ORDER — ONDANSETRON HCL 4 MG PO TABS: 4.0000 mg | ORAL_TABLET | Freq: Four times a day (QID) | ORAL | Status: DC | PRN

## 2022-11-19 MED ORDER — ZOLPIDEM TARTRATE 5 MG PO TABS
10.0000 mg | ORAL_TABLET | Freq: Every evening | ORAL | Status: DC | PRN
  Administered 2022-11-19 – 2022-11-22 (×4): 10 mg via ORAL
  Filled 2022-11-19 (×4): qty 2

## 2022-11-19 MED ORDER — MIRTAZAPINE 15 MG PO TABS
30.0000 mg | ORAL_TABLET | Freq: Every day | ORAL | Status: DC
  Administered 2022-11-19 – 2022-11-22 (×4): 30 mg via ORAL
  Filled 2022-11-19 (×4): qty 2

## 2022-11-19 MED ORDER — VANCOMYCIN HCL 125 MG PO CAPS
125.0000 mg | ORAL_CAPSULE | Freq: Four times a day (QID) | ORAL | Status: DC
  Administered 2022-11-19 – 2022-11-23 (×14): 125 mg via ORAL
  Filled 2022-11-19 (×15): qty 1

## 2022-11-19 MED ORDER — SIROLIMUS 0.5 MG PO TABS
1.0000 mg | ORAL_TABLET | Freq: Every day | ORAL | Status: DC
  Administered 2022-11-20: 1 mg via ORAL
  Filled 2022-11-19: qty 2

## 2022-11-19 MED ORDER — SPIRONOLACTONE 25 MG PO TABS
50.0000 mg | ORAL_TABLET | Freq: Every day | ORAL | Status: DC
  Administered 2022-11-20 – 2022-11-21 (×2): 50 mg via ORAL
  Filled 2022-11-19 (×2): qty 2

## 2022-11-19 MED ORDER — MORPHINE SULFATE (PF) 4 MG/ML IV SOLN
4.0000 mg | Freq: Once | INTRAVENOUS | Status: AC
  Administered 2022-11-19: 4 mg via INTRAVENOUS
  Filled 2022-11-19: qty 1

## 2022-11-19 NOTE — ED Notes (Signed)
Occult card at bedside 

## 2022-11-19 NOTE — Assessment & Plan Note (Signed)
H&H is stable Will monitor closely

## 2022-11-19 NOTE — Assessment & Plan Note (Signed)
Continue sirolimus We will request nephrology consult during this hospitalization

## 2022-11-19 NOTE — Progress Notes (Signed)
Plan of Care Note for accepted transfer    Patient: Jordan Hanna MRN: [id number] DOB: 06-29-1961 DOA: 11/19/2022   Facility requesting transfer: [facility] Requesting Provider: Jeanelle Malling, PA Reason for transfer: severe diarrhea Facility course:    Patient with history of CKD and prior renal transplant, DVT, HTN, presented to the ED to be evaluated for diarrhea.  This has been going on for the past month and appeared after he had neck surgery.  Reports several episodes per day, and occasional blood, diarrhea gets worse postprandial.  CT scan shows descending colitis, infectious versus inflammatory.  Patient has a leukocytosis of 14.5.  Hemoglobin is stable, renal function is at his baseline.  GI was consulted by EDP, Dr. Lorenso Quarry, recommended admission at [facility] and GI will see in consultation.   Plan of care: The patient is accepted for admission to Med-surg unit, at [facility].  Author: Pamella Pert, MD, PhD [facility]   Check [email or web address] for on-call coverage.   Nursing staff, Please call [facility] Admits & Consults System-Wide number on Amion as soon as patient's arrival, so appropriate admitting provider can evaluate the patient

## 2022-11-19 NOTE — Assessment & Plan Note (Signed)
Continue Eliquis 2.5 mg twice daily.  

## 2022-11-19 NOTE — Assessment & Plan Note (Signed)
Secondary to lupus nephritis Monitor renal function closely during this hospitalization

## 2022-11-19 NOTE — Assessment & Plan Note (Signed)
Continue Remeron 

## 2022-11-19 NOTE — ED Provider Notes (Signed)
[place] EMERGENCY DEPARTMENT AT [facility] Provider Note   CSN: [id number] Arrival date & time: 11/19/22  1028     History {Add pertinent medical, surgical, social history, OB history to HPI:1} Chief Complaint  Patient presents with   Diarrhea    Jordan Hanna is a 62 y.o. male with a past medical history of CKD, DVT, hypertension, kidney transplant recipient presents today for evaluation of diarrhea.  Patient had a neck surgery on 4/1 and stated he has been having diarrhea since.  He reported 4-5 episodes of diarrhea.  He reports sometimes he saw bright red blood in his toilet and on his wipes.  States every time he eats or drink he would have severe watery diarrhea.  Denies nausea or vomiting.  States his primary care doctor put him on Flagyl and that make his diarrhea worse.  States he has burning sensation around his rectum.   Diarrhea     Past Medical History:  Diagnosis Date   Anemia in chronic kidney disease (CKD)    Blood clot in vein 12/2020   right tibial vein   Chronic kidney disease    s/p renal transplant 10-17-2020   DVT (deep venous thrombosis) (HCC)    in leg   Hypertension    Kidney transplant recipient    Prostate cancer (HCC)    Systemic Lupus (HCC)    Past Surgical History:  Procedure Laterality Date   colonscopy  2022   GOLD SEED IMPLANT N/A 12/26/2021   Procedure: GOLD SEED IMPLANT;  Surgeon: Jerilee Field, MD;  Location: [facility];  Service: Urology;  Laterality: N/A;   kidney  transplant  10/17/2020   done @ [facility]   LASIK Bilateral 1999   PROSTATE BIOPSY  2022   RENAL BIOPSY  10/2020   right wrist surgery  2000   SPACE OAR INSTILLATION N/A 12/26/2021   Procedure: SPACE OAR INSTILLATION;  Surgeon: Jerilee Field, MD;  Location: [facility];  Service: Urology;  Laterality: N/A;     Home Medications Prior to Admission medications   Medication Sig Start Date End Date Taking? Authorizing Provider   Belatacept (NULOJIX IV) Inject into the vein. Takes the 12th of every month-infusion    [provider]  ELIQUIS 5 MG TABS tablet Take 1 tablet (5 mg total) by mouth 2 (two) times daily. Take 1/2 tablet or 2.5 mg by mouth 2 (two) times daily 12/28/21   Jerilee Field, MD  eplerenone (INSPRA) 50 MG tablet Take 50 mg by mouth daily. 09/06/20   [provider]  hydroxychloroquine (PLAQUENIL) 200 MG tablet Take 200 mg by mouth 2 (two) times daily. Takes at 1200 pm 06/21/16   [provider]  lisinopril (ZESTRIL) 5 MG tablet Take 5 mg by mouth daily.    [provider]  mirtazapine (REMERON) 30 MG tablet Take 30 mg by mouth at bedtime. 08/04/19   [provider]  sirolimus (RAPAMUNE) 1 MG tablet Take 1 mg by mouth daily. 05/18/21   [provider]  zolpidem (AMBIEN) 5 MG tablet Take 10 mg by mouth at bedtime as needed. 10/19/19   [provider]      Allergies    Prednisone    Review of Systems   Review of Systems  Gastrointestinal:  Positive for diarrhea.    Physical Exam Updated Vital Signs BP (!) 116/100 (BP Location: Right Arm)   Pulse (!) 106   Temp 98 F (36.7 C) (Oral)   Resp  18   Ht 5\' 9"  (1.753 m)   Wt 76.2 kg   SpO2 99%   BMI 24.81 kg/m  Physical Exam Vitals and nursing note reviewed.  Constitutional:      Appearance: Normal appearance.  HENT:     Head: Normocephalic and atraumatic.     Mouth/Throat:     Mouth: Mucous membranes are moist.  Eyes:     General: No scleral icterus. Cardiovascular:     Rate and Rhythm: Normal rate and regular rhythm.     Pulses: Normal pulses.     Heart sounds: Normal heart sounds.  Pulmonary:     Effort: Pulmonary effort is normal.     Breath sounds: Normal breath sounds.  Abdominal:     General: Abdomen is flat.     Palpations: Abdomen is soft.     Tenderness: There is generalized abdominal tenderness.  Musculoskeletal:        General: No deformity.  Skin:     General: Skin is warm.     Findings: No rash.  Neurological:     General: No focal deficit present.     Mental Status: He is alert.  Psychiatric:        Mood and Affect: Mood normal.     ED Results / Procedures / Treatments   Labs (all labs ordered are listed, but only abnormal results are displayed) Labs Reviewed  COMPREHENSIVE METABOLIC PANEL - Abnormal; Notable for the following components:      Result Value   Sodium 131 (*)    CO2 19 (*)    Glucose, Bld 116 (*)    BUN 34 (*)    Creatinine, Ser 2.68 (*)    Total Bilirubin 1.6 (*)    GFR, Estimated 26 (*)    All other components within normal limits  CBC - Abnormal; Notable for the following components:   WBC 14.5 (*)    RBC 3.84 (*)    Hemoglobin 11.1 (*)    HCT 33.5 (*)    All other components within normal limits  LIPASE, BLOOD    EKG None  Radiology No results found.  Procedures Procedures  {Document cardiac monitor, telemetry assessment procedure when appropriate:1}  Medications Ordered in ED Medications  sodium chloride 0.9 % bolus 1,000 mL (has no administration in time range)  morphine (PF) 4 MG/ML injection 4 mg (has no administration in time range)    ED Course/ Medical Decision Making/ A&P   {   Click here for ABCD2, HEART and other calculatorsREFRESH Note before signing :1}                          Medical Decision Making Amount and/or Complexity of Data Reviewed Labs: ordered. Radiology: ordered.  Risk Prescription drug management.   This patient presents to the ED for diarrhea, abdominal pain, this involves an extensive number of treatment options, and is a complaint that carries with a high risk of complications and morbidity.  The differential diagnosis includes gastroenteritis, colitis, diverticulitis, appendicitis, SBO, hemorrhoid, C. Difficile, infectious etiology.  This is not an exhaustive list.  Lab tests: I ordered and personally interpreted labs.  The pertinent results  include: WBC unremarkable. Hbg unremarkable. Platelets unremarkable. Electrolytes unremarkable. BUN, creatinine unremarkable. ***  Imaging studies: I ordered imaging studies. I personally reviewed, interpreted imaging and agree with the radiologist's interpretations. The results include: ***   Problem list/ ED course/ Critical interventions/ Medical management: HPI: See above Vital  signs ***within normal range and stable throughout visit. Laboratory/imaging studies significant for: See above. On physical examination, patient is afebrile and appears in no acute distress. *** I have reviewed the patient home medicines and have made adjustments as needed.  Cardiac monitoring/EKG: The patient was maintained on a cardiac monitor.  I personally reviewed and interpreted the cardiac monitor which showed an underlying rhythm of: sinus rhythm.  Additional history obtained: External records from outside source obtained and reviewed including: Chart review including previous notes, labs, imaging.  Consultations obtained: I requested consultation with Dr. Amada Jupiter gastroenterology and discussed labs and imaging findings as well as pertinent plan.  She recommended admission and gastroenterology will see patient in consultation. I requested consultation with Dr. Elvera Lennox, and discussed lab and imaging findings as well as pertinent plan.  He recommended admission.  Disposition Continued outpatient therapy. Follow-up with PCP*** recommended for reevaluation of symptoms. Treatment plan discussed with patient.  Pt acknowledged understanding was agreeable to the plan. Worrisome signs and symptoms were discussed with patient, and patient acknowledged understanding to return to the ED if they noticed these signs and symptoms. Patient was stable upon discharge.   This chart was dictated using voice recognition software.  Despite best efforts to proofread,  errors can occur which can change the documentation  meaning.    {Document critical care time when appropriate:1} {Document review of labs and clinical decision tools ie heart score, Chads2Vasc2 etc:1}  {Document your independent review of radiology images, and any outside records:1} {Document your discussion with family members, caretakers, and with consultants:1} {Document social determinants of health affecting pt's care:1} {Document your decision making why or why not admission, treatments were needed:1} Final Clinical Impression(s) / ED Diagnoses Final diagnoses:  None    Rx / DC Orders ED Discharge Orders     None

## 2022-11-19 NOTE — Assessment & Plan Note (Signed)
-   Continue Plaquenil 

## 2022-11-19 NOTE — ED Triage Notes (Signed)
Pt arrives to ED with c/o diarrhea since 4/1 after neck surgery. He notes 19lb weight loss since the beginning of his symptoms. He notes diarrhe ais black.

## 2022-11-19 NOTE — Assessment & Plan Note (Addendum)
Patient presents to the ER for evaluation of a 4-week history of profuse, watery, foul-smelling and dark-colored diarrhea stools. He completed 2 courses of oral Flagyl, a 10-day course and then a 6-day course which was discontinued due to symptoms. C. difficile toxin is positive Place patient on enteric and contact isolation We will treat patient with vancomycin 125 mg p.o. 4 times daily to complete a 10-day course of therapy. Consult GI in a.m.

## 2022-11-19 NOTE — Assessment & Plan Note (Signed)
Continue lisinopril and spironolactone

## 2022-11-19 NOTE — H&P (Addendum)
History and Physical    Patient: Jordan Hanna UEA:[id number] DOB: 07/10/1961 DOA: 11/19/2022 DOS: the patient was seen and examined on 11/19/2022 PCP: Pieter Partridge., MD  Patient coming from: Home  Chief Complaint:  Chief Complaint  Patient presents with   Diarrhea   HPI: Karanvir Balderston is a 62 y.o. male with medical history significant for chronic kidney disease status post renal transplant 03/22, history of DVT on chronic anticoagulation therapy, hypertension who presents to the emergency room for evaluation of diarrhea. Patient notes that he has had diarrhea for close to a month after his neck surgery.  He describes several episodes of loose, foul-smelling, watery stools occasionally containing bright red blood and sometimes dark blood.  He states that everything he eats runs right through him and he has had about 19 pound weight loss in the last 1 month.  He has had intermittent fever and chills and also complains of diffuse abdominal pain. Patient states that he has been on 2 different courses of Flagyl for abdominal symptoms and recently received a one-time dose of antibiotics prior to cervical spine surgery. He denies having any sick contacts and denies having any chest pain, no shortness of breath, no dizziness, no lightheadedness, no headache, no blurred vision, no leg swelling, no urinary symptoms, no headache, no focal deficit. Abnormal labs include positive fecal occult blood, C. difficile antigen and toxin positive, white count of 14.5, sodium of 131, CT scan of abdomen and pelvis without contrast shows long segment circumferential colonic wall thickening involving the descending colon with adjacent fat stranding, suggestive of an infectious or inflammatory colitis. Aortic atherosclerosis Patient noted to have a fever with a Tmax of 101.5 Patient will be admitted to the hospital for further evaluation   Review of Systems: As mentioned in the history of present illness.  All other systems reviewed and are negative. Past Medical History:  Diagnosis Date   Anemia in chronic kidney disease (CKD)    Blood clot in vein 12/2020   right tibial vein   Chronic kidney disease    s/p renal transplant 10-17-2020   DVT (deep venous thrombosis) (HCC)    in leg   Hypertension    Kidney transplant recipient    Prostate cancer (HCC)    Systemic Lupus (HCC)    Past Surgical History:  Procedure Laterality Date   colonscopy  2022   GOLD SEED IMPLANT N/A 12/26/2021   Procedure: GOLD SEED IMPLANT;  Surgeon: Jerilee Field, MD;  Location: [facility];  Service: Urology;  Laterality: N/A;   kidney  transplant  10/17/2020   done @ [facility]   LASIK Bilateral 1999   PROSTATE BIOPSY  2022   RENAL BIOPSY  10/2020   right wrist surgery  2000   SPACE OAR INSTILLATION N/A 12/26/2021   Procedure: SPACE OAR INSTILLATION;  Surgeon: Jerilee Field, MD;  Location: [facility];  Service: Urology;  Laterality: N/A;   Social History:  reports that he has never smoked. He has never used smokeless tobacco. He reports that he does not currently use drugs. No history on file for alcohol use.  Allergies  Allergen Reactions   Prednisone Swelling    Swelling behind the eye that causes not being able to see    Family History  Problem Relation Age of Onset   Diabetes Mother    Hypertension Father    Diabetes Father     Prior to Admission medications   Medication Sig Start Date End Date  Taking? Authorizing Provider  acetaminophen (TYLENOL) 500 MG tablet Take 500 mg by mouth every 6 (six) hours as needed for fever.   Yes [provider]  apixaban (ELIQUIS) 2.5 MG TABS tablet Take 2.5 mg by mouth every 12 (twelve) hours.   Yes [provider]  eplerenone (INSPRA) 50 MG tablet Take 50 mg by mouth daily. 09/06/20  Yes [provider]  hydroxychloroquine (PLAQUENIL) 200 MG tablet Take 200 mg by mouth 2 (two) times daily. 06/21/16  Yes  [provider]  lisinopril (ZESTRIL) 5 MG tablet Take 5 mg by mouth daily.   Yes [provider]  mirtazapine (REMERON) 30 MG tablet Take 30 mg by mouth at bedtime. 08/04/19  Yes [provider]  sirolimus (RAPAMUNE) 1 MG tablet Take 1-2 mg by mouth as directed. Take 1 tablet (1 mg) Daily Except on (MWF) Take 2 tablets (2 mg) 05/18/21  Yes [provider]  zolpidem (AMBIEN) 10 MG tablet Take 10 mg by mouth at bedtime.   Yes [provider]  Belatacept (NULOJIX IV) Inject into the vein. Takes the 12th of every month-infusion    [provider]  ELIQUIS 5 MG TABS tablet Take 1 tablet (5 mg total) by mouth 2 (two) times daily. Take 1/2 tablet or 2.5 mg by mouth 2 (two) times daily Patient not taking: Reported on 11/19/2022 12/28/21   Jerilee Field, MD    Physical Exam: Vitals:   11/19/22 1800 11/19/22 2000 11/19/22 2012 11/19/22 2115  BP: 122/78 (!) 137/95  (!) 147/97  Pulse: 81 88  89  Resp: 17 12  18   Temp:   98.7 F (37.1 C) (!) 101.5 F (38.6 C)  TempSrc:   Oral Oral  SpO2: 100% 100%  100%  Weight:      Height:       Physical Exam Vitals and nursing note reviewed.  Constitutional:      Appearance: Normal appearance.  HENT:     Head: Normocephalic and atraumatic.     Nose: Nose normal.     Mouth/Throat:     Mouth: Mucous membranes are moist.  Eyes:     Conjunctiva/sclera: Conjunctivae normal.  Cardiovascular:     Rate and Rhythm: Normal rate and regular rhythm.  Pulmonary:     Effort: Pulmonary effort is normal.     Breath sounds: Normal breath sounds.  Abdominal:     General: Abdomen is flat. Bowel sounds are normal.     Tenderness: There is abdominal tenderness.     Comments: Diffusely tender  Musculoskeletal:        General: Normal range of motion.     Cervical back: Normal range of motion and neck supple.  Skin:    General: Skin is warm and dry.  Neurological:     Mental Status: He is alert.     Motor:  Weakness present.  Psychiatric:        Mood and Affect: Mood normal.        Behavior: Behavior normal.     Data Reviewed: Relevant notes from primary care and specialist visits, past discharge summaries as available in EHR, including Care Everywhere. Prior diagnostic testing as pertinent to current admission diagnoses Updated medications and problem lists for reconciliation ED course, including vitals, labs, imaging, treatment and response to treatment Triage notes, nursing and pharmacy notes and ED provider's notes Notable results as noted in HPI Labs reviewed.  Lipase 13, sodium 131, potassium 4.4, chloride 100, bicarb 19, glucose 116,  BUN 34, creatinine 2.68, calcium 9.1, total protein 7.2, albumin 3.8, AST 18, ALT 13, alkaline phosphatase 100, total bilirubin 1.6, white count 14.5, hemoglobin 11.1, hematocrit 33.5, platelet count 258 There are no new results to review at this time.  Assessment and Plan: * C. difficile colitis Patient presents to the ER for evaluation of a 4-week history of profuse, watery, foul-smelling and dark-colored diarrhea stools. He completed 2 courses of oral Flagyl, a 10-day course and then a 6-day course which was discontinued due to symptoms. C. difficile toxin is positive Place patient on enteric and contact isolation We will treat patient with vancomycin 125 mg p.o. 4 times daily to complete a 10-day course of therapy. Consult GI in a.m.    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 Monitor renal function closely during this hospitalization   History of deep vein thrombosis Continue Eliquis 2.5 mg twice daily  Essential hypertension Continue lisinopril and spironolactone  Kidney transplanted Continue sirolimus We will request nephrology consult during this hospitalization  Systemic lupus erythematosus (HCC) Continue Plaquenil  Anemia in chronic kidney disease  (CKD) H&H is stable Will monitor closely      Advance Care Planning:   Code Status: Full Code   Consults: Nephrology, GI  Family Communication: Greater than 50% of time was spent discussing patient's condition and plan of care with him and his wife at the bedside.  All questions and concerns have been addressed.  They verbalized understanding and agree with the plan.  Severity of Illness: The appropriate patient status for this patient is INPATIENT. Inpatient status is judged to be reasonable and necessary in order to provide the required intensity of service to ensure the patient's safety. The patient's presenting symptoms, physical exam findings, and initial radiographic and laboratory data in the context of their chronic comorbidities is felt to place them at high risk for further clinical deterioration. Furthermore, it is not anticipated that the patient will be medically stable for discharge from the hospital within 2 midnights of admission.   * I certify that at the point of admission it is my clinical judgment that the patient will require inpatient hospital care spanning beyond 2 midnights from the point of admission due to high intensity of service, high risk for further deterioration and high frequency of surveillance required.*  Author: Lucile Shutters, MD 11/19/2022 10:10 PM  For on call review [email or web address].

## 2022-11-20 ENCOUNTER — Other Ambulatory Visit (HOSPITAL_COMMUNITY): Payer: Self-pay

## 2022-11-20 ENCOUNTER — Telehealth (HOSPITAL_COMMUNITY): Payer: Self-pay | Admitting: Pharmacy Technician

## 2022-11-20 DIAGNOSIS — E871 Hypo-osmolality and hyponatremia: Secondary | ICD-10-CM | POA: Diagnosis present

## 2022-11-20 DIAGNOSIS — D631 Anemia in chronic kidney disease: Secondary | ICD-10-CM | POA: Diagnosis present

## 2022-11-20 DIAGNOSIS — A0472 Enterocolitis due to Clostridium difficile, not specified as recurrent: Secondary | ICD-10-CM | POA: Diagnosis present

## 2022-11-20 DIAGNOSIS — N184 Chronic kidney disease, stage 4 (severe): Secondary | ICD-10-CM | POA: Diagnosis present

## 2022-11-20 DIAGNOSIS — Z940 Kidney transplant status: Secondary | ICD-10-CM | POA: Diagnosis not present

## 2022-11-20 DIAGNOSIS — Z7901 Long term (current) use of anticoagulants: Secondary | ICD-10-CM | POA: Diagnosis not present

## 2022-11-20 DIAGNOSIS — M3214 Glomerular disease in systemic lupus erythematosus: Secondary | ICD-10-CM | POA: Diagnosis present

## 2022-11-20 DIAGNOSIS — Z888 Allergy status to other drugs, medicaments and biological substances status: Secondary | ICD-10-CM | POA: Diagnosis not present

## 2022-11-20 DIAGNOSIS — Z79899 Other long term (current) drug therapy: Secondary | ICD-10-CM | POA: Diagnosis not present

## 2022-11-20 DIAGNOSIS — D84821 Immunodeficiency due to drugs: Secondary | ICD-10-CM | POA: Diagnosis present

## 2022-11-20 DIAGNOSIS — I129 Hypertensive chronic kidney disease with stage 1 through stage 4 chronic kidney disease, or unspecified chronic kidney disease: Secondary | ICD-10-CM | POA: Diagnosis present

## 2022-11-20 DIAGNOSIS — E876 Hypokalemia: Secondary | ICD-10-CM | POA: Diagnosis not present

## 2022-11-20 DIAGNOSIS — E872 Acidosis, unspecified: Secondary | ICD-10-CM | POA: Diagnosis not present

## 2022-11-20 DIAGNOSIS — A414 Sepsis due to anaerobes: Secondary | ICD-10-CM | POA: Diagnosis present

## 2022-11-20 DIAGNOSIS — F32A Depression, unspecified: Secondary | ICD-10-CM | POA: Diagnosis present

## 2022-11-20 DIAGNOSIS — R197 Diarrhea, unspecified: Secondary | ICD-10-CM | POA: Diagnosis present

## 2022-11-20 DIAGNOSIS — Z923 Personal history of irradiation: Secondary | ICD-10-CM | POA: Diagnosis not present

## 2022-11-20 DIAGNOSIS — Z833 Family history of diabetes mellitus: Secondary | ICD-10-CM | POA: Diagnosis not present

## 2022-11-20 DIAGNOSIS — Z86718 Personal history of other venous thrombosis and embolism: Secondary | ICD-10-CM | POA: Diagnosis not present

## 2022-11-20 DIAGNOSIS — E861 Hypovolemia: Secondary | ICD-10-CM | POA: Diagnosis present

## 2022-11-20 DIAGNOSIS — Z8249 Family history of ischemic heart disease and other diseases of the circulatory system: Secondary | ICD-10-CM | POA: Diagnosis not present

## 2022-11-20 DIAGNOSIS — Z8546 Personal history of malignant neoplasm of prostate: Secondary | ICD-10-CM | POA: Diagnosis not present

## 2022-11-20 LAB — GASTROINTESTINAL PANEL BY PCR, STOOL (REPLACES STOOL CULTURE)

## 2022-11-20 LAB — BASIC METABOLIC PANEL
Anion gap: 13 (ref 5–15)
BUN: 39 mg/dL — ABNORMAL HIGH (ref 8–23)
CO2: 16 mmol/L — ABNORMAL LOW (ref 22–32)
Calcium: 8.3 mg/dL — ABNORMAL LOW (ref 8.9–10.3)
Chloride: 102 mmol/L (ref 98–111)
Creatinine, Ser: 2.56 mg/dL — ABNORMAL HIGH (ref 0.61–1.24)
GFR, Estimated: 27 mL/min — ABNORMAL LOW (ref >60)
Glucose, Bld: 110 mg/dL — ABNORMAL HIGH (ref 70–99)
Potassium: 4.2 mmol/L (ref 3.5–5.1)
Sodium: 131 mmol/L — ABNORMAL LOW (ref 135–145)

## 2022-11-20 LAB — CBC
HCT: 32.1 % — ABNORMAL LOW (ref 39.0–52.0)
Hemoglobin: 10.5 g/dL — ABNORMAL LOW (ref 13.0–17.0)
MCH: 29.3 pg (ref 26.0–34.0)
MCHC: 32.7 g/dL (ref 30.0–36.0)
MCV: 89.7 fL (ref 80.0–100.0)
Platelets: 245 10*3/uL (ref 150–400)
RBC: 3.58 MIL/uL — ABNORMAL LOW (ref 4.22–5.81)
RDW: 14.5 % (ref 11.5–15.5)
WBC: 12.1 10*3/uL — ABNORMAL HIGH (ref 4.0–10.5)
nRBC: 0 % (ref 0.0–0.2)

## 2022-11-20 LAB — HIV ANTIBODY (ROUTINE TESTING W REFLEX): HIV Screen 4th Generation wRfx: NONREACTIVE

## 2022-11-20 MED ORDER — SIROLIMUS 0.5 MG PO TABS
1.0000 mg | ORAL_TABLET | ORAL | Status: DC
  Administered 2022-11-22: 1 mg via ORAL
  Filled 2022-11-20: qty 2

## 2022-11-20 MED ORDER — SIROLIMUS 0.5 MG PO TABS
2.0000 mg | ORAL_TABLET | ORAL | Status: DC
  Administered 2022-11-21 – 2022-11-23 (×2): 2 mg via ORAL
  Filled 2022-11-20 (×2): qty 4

## 2022-11-20 MED ORDER — SODIUM CHLORIDE 0.9 % IV SOLN: INTRAVENOUS | Status: DC

## 2022-11-20 NOTE — TOC Benefit Eligibility Note (Signed)
Patient Product/process development scientist completed.    The patient is currently admitted and upon discharge could be taking vancomycin 125 mg capsules.  The current 10 day co-pay is $0.00.   The patient is insured through [employer]   This test claim was processed through [facility]- copay amounts may vary at other pharmacies due to pharmacy/plan contracts, or as the patient moves through the different stages of their insurance plan.  Roland Earl, CPHT Pharmacy Patient Advocate Specialist [facility] Patient Advocate Team Direct Number: [phone]  Fax: [phone]

## 2022-11-20 NOTE — TOC Progression Note (Signed)
Transition of Care [facility]) - Progression Note    Patient Details  Name: Jordan Hanna MRN: [id number] Date of Birth: [birth year]  Transition of Care [facility]) CM/SW Contact  Coralyn Helling, [place] Phone Number: 11/20/2022, 9:36 AM  Clinical Narrative:     Transition of Care [facility]) Screening Note   Patient Details  Name: Jordan Hanna Date of Birth: 04-21-1961   Transition of Care [facility]) CM/SW Contact:    Coralyn Helling, LCSW Phone Number: 11/20/2022, 9:36 AM    Transition of Care Department [facility] has reviewed patient and no TOC needs have been identified at this time. We will continue to monitor patient advancement through interdisciplinary progression rounds. If new patient transition needs arise, please place a TOC consult.          Expected Discharge Plan and Services                                               Social Determinants of Health (SDOH) Interventions SDOH Screenings   Food Insecurity: No Food Insecurity (11/19/2022)  Housing: Low Risk  (11/19/2022)  Transportation Needs: No Transportation Needs (11/19/2022)  Utilities: Not At Risk (11/19/2022)  Tobacco Use: Low Risk  (11/19/2022)    Readmission Risk Interventions     No data to display

## 2022-11-20 NOTE — Progress Notes (Signed)
PROGRESS NOTE    Jordan Hanna  ZOX:[id number] DOB: 1960/11/28 DOA: 11/19/2022 PCP: Pieter Partridge., MD    Brief Narrative:   [name] is a 62 y.o. male with past medical history significant for SLE, HTN, history of DVT on anticoagulation, chronic kidney disease s/p renal transplant who presents to [facility] ED on 4/29 with persistent diarrhea x 1 month.  Patient describes multiple episodes of loose, foul-smelling, watery stools occasionally with bright red blood during this timeframe.  Also endorses weight loss of roughly 19 pounds with intermittent fever/chills and diffuse abdominal pain.  Patient reports treatment with 2 courses of Flagyl and received a one-time dose of antibiotics prior to his recent cervical spine surgery.  Denies sick contacts.  Further denies chest pain, shortness of breath, no dizziness, no visual changes, no headache, no urinary symptoms, no cough/congestion.  In the ED, temperature 101.5 F, HR 106, RR 18, BP 160/100, SpO2 99% on room air.  WBC 14.5, hemoglobin 11.1, platelet count 258.  Sodium 131, potassium 4.4, chloride 100, CO2 19, creatinine glucose 116, BUN 34, creatinine 2.68.  AST 18, ALT 13, total bilirubin 1.6.  Lipase 13.  CT Abdo/pelvis without contrast with long segment circumferential colonic wall thickening involving descending colon with adjacent fat stranding suggestive of infectious colitis.  C. difficile PCR positive with toxin/antigen.  GI PCR panel negative.  Blood cultures x 2 drawn.  Patient was started on oral vancomycin.  [facility], Dr. Lorenso Quarry was consulted; who recommended medical admission and will see in consultation on arrival.  [facility] consulted for admission and patient was transferred to [facility] for further evaluation management.  Assessment & Plan:   Sepsis secondary to C. difficile colitis Patient presenting to ED with 1 month history of persistent loose watery diarrhea.  Treated with  outpatient Flagyl x 2.  Did receive recent antibiotics prior to his cervical spine surgery.  Patient was febrile with temperature 1 1.5 F, tachycardic with elevated WBC count of 14.5 on admission. CT Abdo/pelvis without contrast with long segment circumferential colonic wall thickening involving descending colon with adjacent fat stranding suggestive of infectious colitis.  C. difficile PCR positive with toxin/antigen.  GI PCR panel negative. -- GI consult: Pending -- WBC 14.5>12.1 -- Vancomycin 125 mg p.o. QID -- IVF with NS at 75 mL/h -- Strict I's and O's; closely monitor frequency of bowel movements daily -- Enteric precautions -- CBC daily -- Plan discharge home once afebrile greater than 24 hours and frequency of bowel movements improves  Hyponatremia Sodium 131 on admission, likely secondary to hypovolemic hyponatremia in the setting of profuse watery diarrhea from C. difficile colitis as above. -- NS at 75 mL/h --Repeat BMP in a.m.  Systemic lupus erythematous -- Plaquenil  CKD s/p renal transplant 09/2020 Baseline creatinine 2.6-2.7.  Creatinine on admission 2.68, at baseline. -- Sirolimus 2 mg on MWF, 1 mg on TTSS -- BMP daily  History of DVT on anticoagulation -- Eliquis 2.5 mg p.o. twice daily  Essential hypertension -- Lisinopril 5 mg p.o. daily -- Spironolactone 25 mg p.o. daily (substituted for home eplerenone)  Anemia of chronic medical/renal disease Hemoglobin stable, 11.1.   DVT prophylaxis: apixaban (ELIQUIS) tablet 2.5 mg Start: 11/19/22 2215 apixaban (ELIQUIS) tablet 2.5 mg    Code Status: Full Code Family Communication: No family present at bedside this morning  Disposition Plan:  Level of care: Med-Surg Status is: Inpatient Remains inpatient appropriate because: IV fluids, oral vancomycin, needs improvement in the frequency of bowel  movements and needs to be afebrile for greater than 24 hours before stable for discharge home, anticipate discharge home  in 1-2 days   Consultants:  [facility] gastroenterology Nephrology  Procedures:  None  Antimicrobials:  Oral vancomycin 4/29>>   Subjective: Patient seen examined bedside, resting comfortably.  Lying in bed.  Continues with watery stool, reported 8 episodes over the last 24 hours.  Currently afebrile.  Continues on oral vancomycin.  Discussed will need to be at least fever free for 24 hours with reduction in the frequency of bowel movements before able to discharge home.  No other specific questions or concerns at this time.  Appreciative the care he is receiving.  Denies headache, no dizziness, no chest pain, no palpitations, no current fever, no chills/night sweats, no nausea/vomiting, no current abdominal pain, no focal weakness, no cough/congestion, no fatigue, no paresthesias.  No acute events overnight per nursing staff.  Objective: Vitals:   11/19/22 2115 11/19/22 2309 11/20/22 0124 11/20/22 0506  BP: (!) 147/97  (!) 143/90 126/83  Pulse: 89  87 (!) 109  Resp: 18  18 18   Temp: (!) 101.5 F (38.6 C) 99.1 F (37.3 C) 98.5 F (36.9 C) 100.3 F (37.9 C)  TempSrc: Oral Oral Oral Oral  SpO2: 100%  100% 100%  Weight:      Height:        Intake/Output Summary (Last 24 hours) at 11/20/2022 1352 Last data filed at 11/20/2022 0603 Gross per 24 hour  Intake 778.56 ml  Output 4 ml  Net 774.56 ml   Filed Weights   11/19/22 1043  Weight: 76.2 kg    Examination:  Physical Exam: GEN: NAD, alert and oriented x 3, chronically ill in appearance HEENT: NCAT, PERRL, EOMI, sclera clear, MMM PULM: CTAB w/o wheezes/crackles, normal respiratory effort, on room air CV: RRR w/o M/G/R GI: abd soft, NTND, slightly hyperactive bowel sounds, no R/G/M MSK: no peripheral edema, muscle strength globally intact 5/5 bilateral upper/lower extremities NEURO: CN II-XII intact, no focal deficits, sensation to light touch intact PSYCH: normal mood/affect Integumentary: dry/intact, no rashes or  wounds    Data Reviewed: I have personally reviewed following labs and imaging studies  CBC: Recent Labs  Lab 11/19/22 1048 11/20/22 0417  WBC 14.5* 12.1*  HGB 11.1* 10.5*  HCT 33.5* 32.1*  MCV 87.2 89.7  PLT 258 245   Basic Metabolic Panel: Recent Labs  Lab 11/19/22 1048 11/20/22 0417  NA 131* 131*  K 4.4 4.2  CL 100 102  CO2 19* 16*  GLUCOSE 116* 110*  BUN 34* 39*  CREATININE 2.68* 2.56*  CALCIUM 9.1 8.3*   GFR: Estimated Creatinine Clearance: 29.5 mL/min (A) (by C-G formula based on SCr of 2.56 mg/dL (H)). Liver Function Tests: Recent Labs  Lab 11/19/22 1048  AST 18  ALT 13  ALKPHOS 100  BILITOT 1.6*  PROT 7.2  ALBUMIN 3.8   Recent Labs  Lab 11/19/22 1048  LIPASE 13   No results for input(s): "AMMONIA" in the last 168 hours. Coagulation Profile: No results for input(s): "INR", "PROTIME" in the last 168 hours. Cardiac Enzymes: No results for input(s): "CKTOTAL", "CKMB", "CKMBINDEX", "TROPONINI" in the last 168 hours. BNP (last 3 results) No results for input(s): "PROBNP" in the last 8760 hours. HbA1C: No results for input(s): "HGBA1C" in the last 72 hours. CBG: No results for input(s): "GLUCAP" in the last 168 hours. Lipid Profile: No results for input(s): "CHOL", "HDL", "LDLCALC", "TRIG", "CHOLHDL", "LDLDIRECT" in the last 72 hours.  Thyroid Function Tests: No results for input(s): "TSH", "T4TOTAL", "FREET4", "T3FREE", "THYROIDAB" in the last 72 hours. Anemia Panel: No results for input(s): "VITAMINB12", "FOLATE", "FERRITIN", "TIBC", "IRON", "RETICCTPCT" in the last 72 hours. Sepsis Labs: No results for input(s): "PROCALCITON", "LATICACIDVEN" in the last 168 hours.  Recent Results (from the past 240 hour(s))  C Difficile Quick Screen w PCR reflex     Status: Abnormal   Collection Time: 11/19/22  4:19 PM   Specimen: STOOL  Result Value Ref Range Status   C Diff antigen POSITIVE (A) NEGATIVE Final   C Diff toxin POSITIVE (A) NEGATIVE Final    C Diff interpretation Toxin producing C. difficile detected.  Final    Comment: CRITICAL RESULT CALLED TO, READ BACK BY AND VERIFIED WITH:  C/ A. HAMILTON, RN 11/19/22 1847 A. LAFRANCE Performed at [facility], [address] [place], [place] [address]   Gastrointestinal Panel by PCR , Stool     Status: None   Collection Time: 11/19/22  4:19 PM   Specimen: STOOL  Result Value Ref Range Status   Campylobacter species NOT DETECTED NOT DETECTED Final   Plesimonas shigelloides NOT DETECTED NOT DETECTED Final   Salmonella species NOT DETECTED NOT DETECTED Final   Yersinia enterocolitica NOT DETECTED NOT DETECTED Final   Vibrio species NOT DETECTED NOT DETECTED Final   Vibrio cholerae NOT DETECTED NOT DETECTED Final   Enteroaggregative E coli (EAEC) NOT DETECTED NOT DETECTED Final   Enteropathogenic E coli (EPEC) NOT DETECTED NOT DETECTED Final   Enterotoxigenic E coli (ETEC) NOT DETECTED NOT DETECTED Final   Shiga like toxin producing E coli (STEC) NOT DETECTED NOT DETECTED Final   Shigella/Enteroinvasive E coli (EIEC) NOT DETECTED NOT DETECTED Final   Cryptosporidium NOT DETECTED NOT DETECTED Final   Cyclospora cayetanensis NOT DETECTED NOT DETECTED Final   Entamoeba histolytica NOT DETECTED NOT DETECTED Final   Giardia lamblia NOT DETECTED NOT DETECTED Final   Adenovirus F40/41 NOT DETECTED NOT DETECTED Final   Astrovirus NOT DETECTED NOT DETECTED Final   Norovirus GI/GII NOT DETECTED NOT DETECTED Final   Rotavirus A NOT DETECTED NOT DETECTED Final   Sapovirus (I, II, IV, and V) NOT DETECTED NOT DETECTED Final    Comment: Performed at [facility], [address] [place], [place] [address]  Culture, blood (Routine X 2) w Reflex to ID Panel     Status: None (Preliminary result)   Collection Time: 11/19/22 10:14 PM   Specimen: BLOOD RIGHT ARM  Result Value Ref Range Status   Specimen Description   Final    BLOOD RIGHT ARM Performed at [facility],  [address] [place], [place] [address]    Special Requests   Final    BOTTLES DRAWN AEROBIC ONLY Blood Culture adequate volume Performed at [facility], [address] [place], [place] [address]    Culture   Final    NO GROWTH < 12 HOURS Performed at [facility], [address], [place], [place] [address]    Report Status PENDING  Incomplete  Culture, blood (Routine X 2) w Reflex to ID Panel     Status: None (Preliminary result)   Collection Time: 11/19/22 10:16 PM   Specimen: BLOOD LEFT HAND  Result Value Ref Range Status   Specimen Description   Final    BLOOD LEFT HAND Performed at [facility], [address], [place], [place] [address]    Special Requests  Final    BOTTLES DRAWN AEROBIC ONLY Blood Culture adequate volume Performed at [facility], [address] [place], [place] [address]    Culture   Final    NO GROWTH < 12 HOURS Performed at [facility], [address], [place], [place] [address]    Report Status PENDING  Incomplete         Radiology Studies: CT ABDOMEN PELVIS WO CONTRAST  Result Date: 11/19/2022 CLINICAL DATA:  Abdominal pain, acute, nonlocalized non localized abdominal pain EXAM: CT ABDOMEN AND PELVIS WITHOUT CONTRAST TECHNIQUE: Multidetector CT imaging of the abdomen and pelvis was performed following the standard protocol without IV contrast. RADIATION DOSE REDUCTION: This exam was performed according to the departmental dose-optimization program which includes automated exposure control, adjustment of the mA and/or kV according to patient size and/or use of iterative reconstruction technique. COMPARISON:  11/02/2021 FINDINGS: Lower chest: No acute abnormality. Hepatobiliary: Unremarkable unenhanced appearance of the liver. No focal liver lesion identified. Gallbladder within normal limits. No hyperdense gallstone. No biliary dilatation. Pancreas: Unremarkable. No pancreatic ductal  dilatation or surrounding inflammatory changes. Spleen: Normal in size without focal abnormality. Adrenals/Urinary Tract: Unremarkable adrenal glands. Native kidneys are atrophic. Simple cyst within the left kidney which do not require follow-up imaging. No renal stone or hydronephrosis. Transplant kidney in the right iliac fossa without hydronephrosis. Urinary bladder within normal limits. Stomach/Bowel: Long segment circumferential colonic wall thickening involving the descending colon with adjacent fat stranding and trace fluid. Stomach and small bowel are within normal limits. A normal appendix is present in the right lower quadrant. Vascular/Lymphatic: Aortic atherosclerosis. No enlarged abdominal or pelvic lymph nodes. Reproductive: Post treatment changes to the prostate gland. Other: No organized abdominopelvic fluid collection. No pneumoperitoneum. No abdominal wall hernia. Musculoskeletal: No acute or significant osseous findings. IMPRESSION: 1. Long segment circumferential colonic wall thickening involving the descending colon with adjacent fat stranding, suggestive of an infectious or inflammatory colitis. 2. Aortic atherosclerosis (ICD10-I70.0). Electronically Signed   By: Duanne Guess D.O.   On: 11/19/2022 12:55        Scheduled Meds:  apixaban  2.5 mg Oral BID   hydroxychloroquine  200 mg Oral BID   lisinopril  5 mg Oral Daily   mirtazapine  30 mg Oral QHS   [START ON 11/21/2022] Sirolimus  2 mg Oral Once per day on Mon Wed Fri   And   [START ON 11/22/2022] Sirolimus  1 mg Oral Once per day on Sun Tue Thu Sat   spironolactone  50 mg Oral Daily   vancomycin  125 mg Oral QID   Continuous Infusions:  sodium chloride       LOS: 0 days    Time spent: 52 minutes spent on chart review, discussion with nursing staff, consultants, updating family and interview/physical exam; more than 50% of that time was spent in counseling and/or coordination of care.    Alvira Philips [place], DO [facility] Available via Epic secure chat 7am-7pm After these hours, please refer to coverage provider listed on [email or web address] 11/20/2022, 1:52 PM

## 2022-11-20 NOTE — Telephone Encounter (Signed)
Pharmacy Patient Advocate Encounter  Insurance verification completed.    The patient is insured through [employer]   The patient is currently admitted and ran test claims for the following: vancomycin.  Copays and coinsurance results were relayed to Inpatient clinical team.

## 2022-11-20 NOTE — Consult Note (Signed)
[facility] Gastroenterology Consult  Referring Provider: No ref. provider found Primary Care Physician:  Pieter Partridge., MD Primary Gastroenterologist: [facility]  Reason for Consultation: Diarrhea, abnormal CT imaging  SUBJECTIVE:   HPI: Jordan Hanna is a 62 y.o. 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  Last colonoscopy 05/17/2020 by Dr. Roberto Scales for colon cancer screening with findings of normal perianal and digital rectal exam, large amount of liquid and semiliquid stool found in entire colon interfering with visualization status post lavage with adequate visualization, grade 1 internal hemorrhoids, rectal mucosa bled easily with scope contact on retroflexion, mucosal otherwise normal.  Patient underwent anterior cervical discectomy on 10/22/2022.  He noted receiving pre and postoperative antibiotics.  Roughly 2 days postoperatively he was experiencing some constipation and subsequent diarrhea.  He has been experiencing diarrhea episodes too numerous to count.  He has been seeing some blood in his stool as well.  Presented to hospital on 11/12/2022 and was discharged home.  Represented to hospital on 11/19/2022 with similar complaint.  CT imaging showed long segment circumferential colonic wall thickening involving descending colon with adjacent fat stranding suggestive of infectious or inflammatory colitis.  GI pathogen panel positive for C. difficile.  He has subsequently been started on vancomycin.  Nephrology is on consult.  Patient notes that he has some diffuse abdominal discomfort.  Blood per rectum has slowed.  No chest pain or shortness of breath.  No nausea or vomiting.  Past Medical History:  Diagnosis Date   Anemia in chronic kidney disease (CKD)    Blood clot in vein 12/2020   right tibial vein   Chronic kidney disease    s/p renal transplant 10-17-2020   DVT (deep venous thrombosis)  (HCC)    in leg   Hypertension    Kidney transplant recipient    Prostate cancer (HCC)    Systemic Lupus (HCC)    Past Surgical History:  Procedure Laterality Date   colonscopy  2022   GOLD SEED IMPLANT N/A 12/26/2021   Procedure: GOLD SEED IMPLANT;  Surgeon: Jerilee Field, MD;  Location: [facility];  Service: Urology;  Laterality: N/A;   kidney  transplant  10/17/2020   done @ [facility]   LASIK Bilateral 1999   PROSTATE BIOPSY  2022   RENAL BIOPSY  10/2020   right wrist surgery  2000   SPACE OAR INSTILLATION N/A 12/26/2021   Procedure: SPACE OAR INSTILLATION;  Surgeon: Jerilee Field, MD;  Location: [facility];  Service: Urology;  Laterality: N/A;   Prior to Admission medications   Medication Sig Start Date End Date Taking? Authorizing Provider  acetaminophen (TYLENOL) 500 MG tablet Take 500 mg by mouth every 6 (six) hours as needed for fever.   Yes [provider]  apixaban (ELIQUIS) 2.5 MG TABS tablet Take 2.5 mg by mouth every 12 (twelve) hours.   Yes [provider]  eplerenone (INSPRA) 50 MG tablet Take 50 mg by mouth daily. 09/06/20  Yes [provider]  hydroxychloroquine (PLAQUENIL) 200 MG tablet Take 200 mg by mouth 2 (two) times daily. 06/21/16  Yes [provider]  lisinopril (ZESTRIL) 5 MG tablet Take 5 mg by mouth daily.   Yes [provider]  mirtazapine (REMERON) 30 MG tablet Take 30 mg by mouth at bedtime. 08/04/19  Yes [provider]  sirolimus (RAPAMUNE) 1 MG tablet Take 1-2 mg by mouth as directed. Take 1 tablet (  1 mg) Daily Except on (MWF) Take 2 tablets (2 mg) 05/18/21  Yes [provider]  zolpidem (AMBIEN) 10 MG tablet Take 10 mg by mouth at bedtime.   Yes [provider]  Belatacept (NULOJIX IV) Inject into the vein. Takes the 12th of every month-infusion    [provider]  ELIQUIS 5 MG TABS tablet Take 1 tablet (5 mg total) by mouth 2 (two) times  daily. Take 1/2 tablet or 2.5 mg by mouth 2 (two) times daily Patient not taking: Reported on 11/19/2022 12/28/21   Jerilee Field, MD   Current Facility-Administered Medications  Medication Dose Route Frequency Provider Last Rate Last Admin   0.9 %  sodium chloride infusion   Intravenous Continuous [place], Alvira Philips, DO       acetaminophen (TYLENOL) tablet 650 mg  650 mg Oral Q6H PRN Lucile Shutters, MD   650 mg at 11/19/22 2211   Or   acetaminophen (TYLENOL) suppository 650 mg  650 mg Rectal Q6H PRN Agbata, Tochukwu, MD       apixaban (ELIQUIS) tablet 2.5 mg  2.5 mg Oral BID Agbata, Tochukwu, MD   2.5 mg at 11/20/22 1019   hydroxychloroquine (PLAQUENIL) tablet 200 mg  200 mg Oral BID Agbata, Tochukwu, MD   200 mg at 11/20/22 1020   lisinopril (ZESTRIL) tablet 5 mg  5 mg Oral Daily Agbata, Tochukwu, MD   5 mg at 11/20/22 1019   mirtazapine (REMERON) tablet 30 mg  30 mg Oral QHS Agbata, Tochukwu, MD   30 mg at 11/19/22 2210   ondansetron (ZOFRAN) tablet 4 mg  4 mg Oral Q6H PRN Agbata, Tochukwu, MD       Or   ondansetron (ZOFRAN) injection 4 mg  4 mg Intravenous Q6H PRN Agbata, Tochukwu, MD       [START ON 11/21/2022] Sirolimus (RAPAMUNE) tablet 2 mg  2 mg Oral Once per day on Mon Wed Fri [place], Eric J, DO       And   [START ON 11/22/2022] Sirolimus (RAPAMUNE) tablet 1 mg  1 mg Oral Once per day on Sun Tue Thu Sat [place], Eric J, DO       spironolactone (ALDACTONE) tablet 50 mg  50 mg Oral Daily Agbata, Tochukwu, MD   50 mg at 11/20/22 1020   vancomycin (VANCOCIN) capsule 125 mg  125 mg Oral QID Agbata, Tochukwu, MD   125 mg at 11/20/22 1021   zolpidem (AMBIEN) tablet 10 mg  10 mg Oral QHS PRN Agbata, Tochukwu, MD   10 mg at 11/19/22 2211   Allergies as of 11/19/2022 - Review Complete 11/19/2022  Allergen Reaction Noted   Prednisone Swelling 02/18/2020   Family History  Problem Relation Age of Onset   Diabetes Mother    Hypertension Father    Diabetes Father    Social History    Socioeconomic History   Marital status: Married    Spouse name: Not on file   Number of children: Not on file   Years of education: Not on file   Highest education level: Not on file  Occupational History   Not on file  Tobacco Use   Smoking status: Never   Smokeless tobacco: Never  Vaping Use   Vaping Use: Never used  Substance and Sexual Activity   Alcohol use: Not on file    Comment: occ beer   Drug use: Not Currently   Sexual activity: Not on file  Other Topics Concern   Not on file  Social History Narrative   Not on file   Social Determinants of Health   Financial Resource Strain: Not on file  Food Insecurity: No Food Insecurity (11/19/2022)   Hunger Vital Sign    Worried About Running Out of Food in the Last Year: Never true    Ran Out of Food in the Last Year: Never true  Transportation Needs: No Transportation Needs (11/19/2022)   PRAPARE - Administrator, Civil Service (Medical): No    Lack of Transportation (Non-Medical): No  Physical Activity: Not on file  Stress: Not on file  Social Connections: Not on file  Intimate Partner Violence: Not At Risk (11/19/2022)   Humiliation, Afraid, Rape, and Kick questionnaire    Fear of Current or Ex-Partner: No    Emotionally Abused: No    Physically Abused: No    Sexually Abused: No   Review of Systems:  Review of Systems  Respiratory:  Negative for shortness of breath.   Cardiovascular:  Negative for chest pain.  Gastrointestinal:  Positive for abdominal pain and diarrhea. Negative for nausea and vomiting.    OBJECTIVE:   Temp:  [98.2 F (36.8 C)-101.5 F (38.6 C)] 100.3 F (37.9 C) (04/30 0506) Pulse Rate:  [79-109] 109 (04/30 0506) Resp:  [10-18] 18 (04/30 0506) BP: (116-147)/(78-97) 126/83 (04/30 0506) SpO2:  [95 %-100 %] 100 % (04/30 0506) Last BM Date : 11/19/22 Physical Exam Constitutional:      General: He is not in acute distress.    Appearance: He is not ill-appearing,  toxic-appearing or diaphoretic.  Cardiovascular:     Rate and Rhythm: Normal rate and regular rhythm.  Pulmonary:     Effort: No respiratory distress.     Breath sounds: Normal breath sounds.  Abdominal:     General: Bowel sounds are normal. There is no distension.     Palpations: Abdomen is soft.     Tenderness: There is abdominal tenderness. There is no guarding.  Skin:    General: Skin is warm and dry.  Neurological:     Mental Status: He is alert.     Labs: Recent Labs    11/19/22 1048 11/20/22 0417  WBC 14.5* 12.1*  HGB 11.1* 10.5*  HCT 33.5* 32.1*  PLT 258 245   BMET Recent Labs    11/19/22 1048 11/20/22 0417  NA 131* 131*  K 4.4 4.2  CL 100 102  CO2 19* 16*  GLUCOSE 116* 110*  BUN 34* 39*  CREATININE 2.68* 2.56*  CALCIUM 9.1 8.3*   LFT Recent Labs    11/19/22 1048  PROT 7.2  ALBUMIN 3.8  AST 18  ALT 13  ALKPHOS 100  BILITOT 1.6*   PT/INR No results for input(s): "LABPROT", "INR" in the last 72 hours.  Diagnostic imaging: CT ABDOMEN PELVIS WO CONTRAST  Result Date: 11/19/2022 CLINICAL DATA:  Abdominal pain, acute, nonlocalized non localized abdominal pain EXAM: CT ABDOMEN AND PELVIS WITHOUT CONTRAST TECHNIQUE: Multidetector CT imaging of the abdomen and pelvis was performed following the standard protocol without IV contrast. RADIATION DOSE REDUCTION: This exam was performed according to the departmental dose-optimization program which includes automated exposure control, adjustment of the mA and/or kV according to patient size and/or use of iterative reconstruction technique. COMPARISON:  11/02/2021 FINDINGS: Lower chest: No acute abnormality. Hepatobiliary: Unremarkable unenhanced appearance of the liver. No focal liver lesion identified. Gallbladder within normal limits. No hyperdense gallstone. No biliary dilatation. Pancreas: Unremarkable. No pancreatic ductal dilatation or surrounding inflammatory changes. Spleen:  Normal in size without focal  abnormality. Adrenals/Urinary Tract: Unremarkable adrenal glands. Native kidneys are atrophic. Simple cyst within the left kidney which do not require follow-up imaging. No renal stone or hydronephrosis. Transplant kidney in the right iliac fossa without hydronephrosis. Urinary bladder within normal limits. Stomach/Bowel: Long segment circumferential colonic wall thickening involving the descending colon with adjacent fat stranding and trace fluid. Stomach and small bowel are within normal limits. A normal appendix is present in the right lower quadrant. Vascular/Lymphatic: Aortic atherosclerosis. No enlarged abdominal or pelvic lymph nodes. Reproductive: Post treatment changes to the prostate gland. Other: No organized abdominopelvic fluid collection. No pneumoperitoneum. No abdominal wall hernia. Musculoskeletal: No acute or significant osseous findings. IMPRESSION: 1. Long segment circumferential colonic wall thickening involving the descending colon with adjacent fat stranding, suggestive of an infectious or inflammatory colitis. 2. Aortic atherosclerosis (ICD10-I70.0). Electronically Signed   By: Duanne Guess D.O.   On: 11/19/2022 12:55    IMPRESSION: C. difficile colitis, first episode  -First episode of C. difficile colitis, started on vancomycin 5 mg p.o. 4 times daily  -Risk factors include pre and postoperative antibiotics after cervical discectomy 10/22/2022 History of renal transplant secondary to systemic lupus erythematosus in 2022 History DVT on Eliquis prior to admission  PLAN: -Recommend continued C. difficile colitis care with vancomycin -Patient requests nephrology consultation to discuss safety of vancomycin use given his history of renal transplant -Recommend outpatient colonoscopy once C. difficile colitis has improved, patient may follow-up with myself in the outpatient setting for this -Okay to continue Eliquis, monitor stools -Okay for diet as tolerated -[facility] will  follow   LOS: 0 days   Liliane Shi, DO [facility] Gastroenterology

## 2022-11-21 DIAGNOSIS — A0472 Enterocolitis due to Clostridium difficile, not specified as recurrent: Secondary | ICD-10-CM

## 2022-11-21 LAB — BASIC METABOLIC PANEL
Anion gap: 10 (ref 5–15)
BUN: 34 mg/dL — ABNORMAL HIGH (ref 8–23)
CO2: 17 mmol/L — ABNORMAL LOW (ref 22–32)
Calcium: 8.1 mg/dL — ABNORMAL LOW (ref 8.9–10.3)
Chloride: 104 mmol/L (ref 98–111)
Creatinine, Ser: 2.51 mg/dL — ABNORMAL HIGH (ref 0.61–1.24)
GFR, Estimated: 28 mL/min — ABNORMAL LOW (ref >60)
Glucose, Bld: 108 mg/dL — ABNORMAL HIGH (ref 70–99)
Potassium: 4.4 mmol/L (ref 3.5–5.1)
Sodium: 131 mmol/L — ABNORMAL LOW (ref 135–145)

## 2022-11-21 LAB — CBC
HCT: 31.6 % — ABNORMAL LOW (ref 39.0–52.0)
Hemoglobin: 10.1 g/dL — ABNORMAL LOW (ref 13.0–17.0)
MCH: 28.9 pg (ref 26.0–34.0)
MCHC: 32 g/dL (ref 30.0–36.0)
MCV: 90.5 fL (ref 80.0–100.0)
Platelets: 254 10*3/uL (ref 150–400)
RBC: 3.49 MIL/uL — ABNORMAL LOW (ref 4.22–5.81)
RDW: 14.6 % (ref 11.5–15.5)
WBC: 12 10*3/uL — ABNORMAL HIGH (ref 4.0–10.5)
nRBC: 0 % (ref 0.0–0.2)

## 2022-11-21 LAB — PHOSPHORUS: Phosphorus: 3.1 mg/dL (ref 2.5–4.6)

## 2022-11-21 LAB — URINALYSIS, ROUTINE W REFLEX MICROSCOPIC
Bacteria, UA: NONE SEEN
Bilirubin Urine: NEGATIVE
Glucose, UA: NEGATIVE mg/dL
Ketones, ur: 5 mg/dL — AB
Leukocytes,Ua: NEGATIVE
Nitrite: NEGATIVE
Protein, ur: 100 mg/dL — AB
Specific Gravity, Urine: 1.017 (ref 1.005–1.030)
pH: 5 (ref 5.0–8.0)

## 2022-11-21 LAB — SODIUM, URINE, RANDOM: Sodium, Ur: 64 mmol/L

## 2022-11-21 LAB — CULTURE, BLOOD (ROUTINE X 2)

## 2022-11-21 LAB — CREATININE, URINE, RANDOM: Creatinine, Urine: 127 mg/dL

## 2022-11-21 LAB — MAGNESIUM: Magnesium: 2.4 mg/dL (ref 1.7–2.4)

## 2022-11-21 MED ORDER — CLONIDINE HCL 0.1 MG PO TABS: 0.1000 mg | ORAL_TABLET | Freq: Three times a day (TID) | ORAL | Status: DC | PRN

## 2022-11-21 MED ORDER — SODIUM CHLORIDE 0.9 % IV BOLUS
2000.0000 mL | Freq: Once | INTRAVENOUS | Status: AC
  Administered 2022-11-21: 2000 mL via INTRAVENOUS

## 2022-11-21 MED ORDER — STERILE WATER FOR INJECTION IV SOLN
INTRAVENOUS | Status: DC
  Filled 2022-11-21: qty 1000
  Filled 2022-11-21: qty 150
  Filled 2022-11-21 (×2): qty 1000
  Filled 2022-11-21: qty 150
  Filled 2022-11-21: qty 1000

## 2022-11-21 NOTE — Progress Notes (Signed)
[facility] Progress Note  SUBJECTIVE:   Interval history: Jordan Hanna was seen and evaluated today at bedside.  Noted that he has had continued loose bowel movements, have become less frequent, some are bloody, having less abdominal pain.  Tolerating clear liquid diet.  Past Medical History:  Diagnosis Date   Anemia in chronic kidney disease (CKD)    Blood clot in vein 12/2020   right tibial vein   Chronic kidney disease    s/p renal transplant 10-17-2020   DVT (deep venous thrombosis) (HCC)    in leg   Hypertension    Kidney transplant recipient    Prostate cancer (HCC)    Systemic Lupus (HCC)    Past Surgical History:  Procedure Laterality Date   colonscopy  2022   GOLD SEED IMPLANT N/A 12/26/2021   Procedure: GOLD SEED IMPLANT;  Surgeon: Jerilee Field, MD;  Location: [facility];  Service: Urology;  Laterality: N/A;   kidney  transplant  10/17/2020   done @ [facility]   LASIK Bilateral 1999   PROSTATE BIOPSY  2022   RENAL BIOPSY  10/2020   right wrist surgery  2000   SPACE OAR INSTILLATION N/A 12/26/2021   Procedure: SPACE OAR INSTILLATION;  Surgeon: Jerilee Field, MD;  Location: [facility];  Service: Urology;  Laterality: N/A;   Current Facility-Administered Medications  Medication Dose Route Frequency Provider Last Rate Last Admin   acetaminophen (TYLENOL) tablet 650 mg  650 mg Oral Q6H PRN Agbata, Tochukwu, MD   650 mg at 11/20/22 2230   Or   acetaminophen (TYLENOL) suppository 650 mg  650 mg Rectal Q6H PRN Agbata, Tochukwu, MD       apixaban (ELIQUIS) tablet 2.5 mg  2.5 mg Oral BID Agbata, Tochukwu, MD   2.5 mg at 11/21/22 4098   cloNIDine (CATAPRES) tablet 0.1 mg  0.1 mg Oral Q8H PRN Delano Metz, MD       hydroxychloroquine (PLAQUENIL) tablet 200 mg  200 mg Oral BID Agbata, Tochukwu, MD   200 mg at 11/21/22 0915   mirtazapine (REMERON) tablet 30 mg  30 mg Oral QHS Agbata, Tochukwu, MD   30 mg at 11/20/22 2224    ondansetron (ZOFRAN) tablet 4 mg  4 mg Oral Q6H PRN Agbata, Tochukwu, MD       Or   ondansetron (ZOFRAN) injection 4 mg  4 mg Intravenous Q6H PRN Agbata, Tochukwu, MD       Sirolimus (RAPAMUNE) tablet 2 mg  2 mg Oral Once per day on Mon Wed Fri [place], Alvira Philips, DO   2 mg at 11/21/22 0916   And   [START ON 11/22/2022] Sirolimus (RAPAMUNE) tablet 1 mg  1 mg Oral Once per day on Sun Tue Thu Sat [place], Eric J, DO       sodium bicarbonate 150 mEq in sterile water 1,150 mL infusion   Intravenous Continuous Delano Metz, MD       vancomycin (VANCOCIN) capsule 125 mg  125 mg Oral QID Agbata, Tochukwu, MD   125 mg at 11/21/22 1440   zolpidem (AMBIEN) tablet 10 mg  10 mg Oral QHS PRN Agbata, Tochukwu, MD   10 mg at 11/20/22 2224   Allergies as of 11/19/2022 - Review Complete 11/19/2022  Allergen Reaction Noted   Prednisone Swelling 02/18/2020   Review of Systems:  Review of Systems  Respiratory:  Negative for shortness of breath.   Cardiovascular:  Negative for chest pain.  Gastrointestinal:  Positive for blood  in stool and diarrhea.    OBJECTIVE:   Temp:  [98.3 F (36.8 C)-102.6 F (39.2 C)] 98.5 F (36.9 C) (05/01 1333) Pulse Rate:  [87-108] 87 (05/01 1333) Resp:  [17-18] 18 (05/01 1333) BP: (132-149)/(78-87) 132/78 (05/01 1333) SpO2:  [98 %-100 %] 98 % (05/01 1333) Last BM Date : 11/20/22 Physical Exam Constitutional:      General: He is not in acute distress.    Appearance: He is not ill-appearing, toxic-appearing or diaphoretic.  Cardiovascular:     Rate and Rhythm: Tachycardia present.  Pulmonary:     Effort: No respiratory distress.     Breath sounds: Normal breath sounds.  Abdominal:     General: Bowel sounds are normal. There is no distension.     Palpations: Abdomen is soft.     Tenderness: There is no abdominal tenderness. There is no guarding.  Neurological:     Mental Status: He is alert.     Labs: Recent Labs    11/19/22 1048 11/20/22 0417 11/21/22 0429   WBC 14.5* 12.1* 12.0*  HGB 11.1* 10.5* 10.1*  HCT 33.5* 32.1* 31.6*  PLT 258 245 254   BMET Recent Labs    11/19/22 1048 11/20/22 0417 11/21/22 0429  NA 131* 131* 131*  K 4.4 4.2 4.4  CL 100 102 104  CO2 19* 16* 17*  GLUCOSE 116* 110* 108*  BUN 34* 39* 34*  CREATININE 2.68* 2.56* 2.51*  CALCIUM 9.1 8.3* 8.1*   LFT Recent Labs    11/19/22 1048  PROT 7.2  ALBUMIN 3.8  AST 18  ALT 13  ALKPHOS 100  BILITOT 1.6*   PT/INR No results for input(s): "LABPROT", "INR" in the last 72 hours. Diagnostic imaging: No results found.  IMPRESSION: C. difficile colitis, first episode             -First episode of C. difficile colitis, started on vancomycin 125 mg p.o. 4 times daily             -Risk factors include pre and postoperative antibiotics after cervical discectomy 10/22/2022  -Febrile to 39.2 Celsius on 11/20/2022 History of renal transplant secondary to systemic lupus erythematosus in 2022 History DVT on Eliquis prior to admission  PLAN: -Continue vancomycin -Await nephrology recommendations -Recommend outpatient colonoscopy once C. difficile colitis has improved -Continue Eliquis -Diet as tolerated -[facility] will follow   LOS: 1 day   Liliane Shi, [facility]

## 2022-11-21 NOTE — Progress Notes (Signed)
PROGRESS NOTE  Jordan Hanna  ZOX:[id number] DOB: 10-11-1960 DOA: 11/19/2022 PCP: Pieter Partridge., MD   Brief Narrative: Patient is a 62 year old male with history of SLE, hypertension, DVT on anticoagulation, CKD status post renal transplant who presented with complaint of persistent diarrhea for a month.  On presentation he was febrile.  Labs showed WBC count of 14.5, creatinine of 2.6.  CT abdomen/pelvis without contrast showed circumferential colonic wall thickening involving descending colon suspicious for infectious colitis.  C. difficile PCR came at a positive.  Started on oral vancomycin.  GI was following.  Waiting for resolution of diarrhea and fever before discharge plan.  Assessment & Plan:  Principal Problem:   C. difficile colitis Active Problems:   Anemia in chronic kidney disease (CKD)   Systemic lupus erythematosus (HCC)   Kidney transplanted   Essential hypertension   History of deep vein thrombosis   CKD (chronic kidney disease) stage 4, GFR 15-29 ml/min (HCC)   Depression  Sepsis secondary to C. difficile colitis: Presented with 1 month history of loose stools.  Was taking Flagyl as an outpatient with did not help.  Patient recently took antibiotics prior to cervical spine surgery.  Was febrile, tachycardic on presentation, labs with leukocytosis.  CT abdomen/pelvis showed circumferential colonic wall thickening involving descending colon with adjacent fat stranding suggestive of infectious colitis .  C. difficile came out to be positive.  Currently on oral vancomycin.  Continue gentle IV fluids.  Leukocytosis improving. Spiked fever of 102.6 F on 11/20/2022 at 10 PM.  Hyponatremia: Likely secondary to hypovolemic hyponatremia in the setting of profuse watery diarrhea.  Continue gentle IV fluids.  Monitor BMP  CKD: Status post renal transplant on 09/2020.  Baseline creatinine ranges from 2.6-2.7.  Currently kidney function at baseline.  On sirolimus on Monday,  Wednesday, Friday, 1 mg on TTSS.  Monitor BMP.  Patient requesting nephrology consult.  Non-anion gap metabolic acidosis: CO2 of 17.  Likely secondary to combination of CKD and diarrhea.  Will consider  starting on sodium bicarb tablets,but will wait for nephrology  History of DVT: On Eliquis  Hypertension: BP stable.  On lisinopril, spironolactone at home  Anemia of chronic disease: Secondary to CKD.  Currently hemoglobin stable        DVT prophylaxis:apixaban (ELIQUIS) tablet 2.5 mg Start: 11/19/22 2215 apixaban (ELIQUIS) tablet 2.5 mg     Code Status: Full Code  Family Communication: None at bedside  Patient status:Inpatient  Patient is from :home  Anticipated discharge WJ:XBJY  Estimated DC date: After resolution of fever, improvement in diarrhea   Consultants: GI  Procedures: None  Antimicrobials:  Anti-infectives (From admission, onward)    Start     Dose/Rate Route Frequency Ordered Stop   11/19/22 2215  hydroxychloroquine (PLAQUENIL) tablet 200 mg       Note to Pharmacy: Takes at 1200 pm     200 mg Oral 2 times daily 11/19/22 2122     11/19/22 2215  vancomycin (VANCOCIN) capsule 125 mg        125 mg Oral 4 times daily 11/19/22 2123 11/29/22 2159       Subjective: Patient seen and examined at bedside today.  Hemodynamically stable.  Lying in bed.  Had 4 loose bowel movement this morning.  Denies abdominal pain, nausea or vomiting today. Afebrile this mrng  Objective: Vitals:   11/20/22 2051 11/20/22 2223 11/20/22 2330 11/21/22 0552  BP: (!) 143/87   (!) 149/87  Pulse: (!) 108   Marland Kitchen)  105  Resp: 17   18  Temp: 98.3 F (36.8 C) (!) 102.6 F (39.2 C) 99.7 F (37.6 C) 99.3 F (37.4 C)  TempSrc: Oral Oral Oral Oral  SpO2: 100%   100%  Weight:      Height:        Intake/Output Summary (Last 24 hours) at 11/21/2022 1120 Last data filed at 11/20/2022 1500 Gross per 24 hour  Intake --  Output 0 ml  Net 0 ml   Filed Weights   11/19/22 1043  Weight:  76.2 kg    Examination:  General exam: Overall comfortable, not in distress HEENT: PERRL Respiratory system:  no wheezes or crackles  Cardiovascular system: S1 & S2 heard, RRR.  Gastrointestinal system: Abdomen is nondistended, soft and nontender. Central nervous system: Alert and oriented Extremities: No edema, no clubbing ,no cyanosis Skin: No rashes, no ulcers,no icterus     Data Reviewed: I have personally reviewed following labs and imaging studies  CBC: Recent Labs  Lab 11/19/22 1048 11/20/22 0417 11/21/22 0429  WBC 14.5* 12.1* 12.0*  HGB 11.1* 10.5* 10.1*  HCT 33.5* 32.1* 31.6*  MCV 87.2 89.7 90.5  PLT 258 245 254   Basic Metabolic Panel: Recent Labs  Lab 11/19/22 1048 11/20/22 0417 11/21/22 0429  NA 131* 131* 131*  K 4.4 4.2 4.4  CL 100 102 104  CO2 19* 16* 17*  GLUCOSE 116* 110* 108*  BUN 34* 39* 34*  CREATININE 2.68* 2.56* 2.51*  CALCIUM 9.1 8.3* 8.1*  MG  --   --  2.4  PHOS  --   --  3.1     Recent Results (from the past 240 hour(s))  C Difficile Quick Screen w PCR reflex     Status: Abnormal   Collection Time: 11/19/22  4:19 PM   Specimen: STOOL  Result Value Ref Range Status   C Diff antigen POSITIVE (A) NEGATIVE Final   C Diff toxin POSITIVE (A) NEGATIVE Final   C Diff interpretation Toxin producing C. difficile detected.  Final    Comment: CRITICAL RESULT CALLED TO, READ BACK BY AND VERIFIED WITH:  C/ A. HAMILTON, RN 11/19/22 1847 A. LAFRANCE Performed at [facility], [address], [place], [place] [address]   Gastrointestinal Panel by PCR , Stool     Status: None   Collection Time: 11/19/22  4:19 PM   Specimen: STOOL  Result Value Ref Range Status   Campylobacter species NOT DETECTED NOT DETECTED Final   Plesimonas shigelloides NOT DETECTED NOT DETECTED Final   Salmonella species NOT DETECTED NOT DETECTED Final   Yersinia enterocolitica NOT DETECTED NOT DETECTED Final   Vibrio species NOT DETECTED NOT DETECTED Final    Vibrio cholerae NOT DETECTED NOT DETECTED Final   Enteroaggregative E coli (EAEC) NOT DETECTED NOT DETECTED Final   Enteropathogenic E coli (EPEC) NOT DETECTED NOT DETECTED Final   Enterotoxigenic E coli (ETEC) NOT DETECTED NOT DETECTED Final   Shiga like toxin producing E coli (STEC) NOT DETECTED NOT DETECTED Final   Shigella/Enteroinvasive E coli (EIEC) NOT DETECTED NOT DETECTED Final   Cryptosporidium NOT DETECTED NOT DETECTED Final   Cyclospora cayetanensis NOT DETECTED NOT DETECTED Final   Entamoeba histolytica NOT DETECTED NOT DETECTED Final   Giardia lamblia NOT DETECTED NOT DETECTED Final   Adenovirus F40/41 NOT DETECTED NOT DETECTED Final   Astrovirus NOT DETECTED NOT DETECTED Final   Norovirus GI/GII NOT DETECTED NOT DETECTED Final   Rotavirus A NOT DETECTED NOT DETECTED  Final   Sapovirus (I, II, IV, and V) NOT DETECTED NOT DETECTED Final    Comment: Performed at [facility], [address] [place], [place] [address]  Culture, blood (Routine X 2) w Reflex to ID Panel     Status: None (Preliminary result)   Collection Time: 11/19/22 10:14 PM   Specimen: BLOOD RIGHT ARM  Result Value Ref Range Status   Specimen Description   Final    BLOOD RIGHT ARM Performed at [facility], [address] [place], [place] [address]    Special Requests   Final    BOTTLES DRAWN AEROBIC ONLY Blood Culture adequate volume Performed at [facility], [address], [place], [place] [address]    Culture   Final    NO GROWTH 2 DAYS Performed at [facility], [address], [place], [place] [address]    Report Status PENDING  Incomplete  Culture, blood (Routine X 2) w Reflex to ID Panel     Status: None (Preliminary result)   Collection Time: 11/19/22 10:16 PM   Specimen: BLOOD LEFT HAND  Result Value Ref Range Status   Specimen Description   Final    BLOOD LEFT HAND Performed at [facility], [address], [place], [place]  [address]    Special Requests   Final    BOTTLES DRAWN AEROBIC ONLY Blood Culture adequate volume Performed at [facility], [address], [place], [place] [address]    Culture   Final    NO GROWTH 2 DAYS Performed at [facility], [address], [place], [place] [address]    Report Status PENDING  Incomplete     Radiology Studies: CT ABDOMEN PELVIS WO CONTRAST  Result Date: 11/19/2022 CLINICAL DATA:  Abdominal pain, acute, nonlocalized non localized abdominal pain EXAM: CT ABDOMEN AND PELVIS WITHOUT CONTRAST TECHNIQUE: Multidetector CT imaging of the abdomen and pelvis was performed following the standard protocol without IV contrast. RADIATION DOSE REDUCTION: This exam was performed according to the departmental dose-optimization program which includes automated exposure control, adjustment of the mA and/or kV according to patient size and/or use of iterative reconstruction technique. COMPARISON:  11/02/2021 FINDINGS: Lower chest: No acute abnormality. Hepatobiliary: Unremarkable unenhanced appearance of the liver. No focal liver lesion identified. Gallbladder within normal limits. No hyperdense gallstone. No biliary dilatation. Pancreas: Unremarkable. No pancreatic ductal dilatation or surrounding inflammatory changes. Spleen: Normal in size without focal abnormality. Adrenals/Urinary Tract: Unremarkable adrenal glands. Native kidneys are atrophic. Simple cyst within the left kidney which do not require follow-up imaging. No renal stone or hydronephrosis. Transplant kidney in the right iliac fossa without hydronephrosis. Urinary bladder within normal limits. Stomach/Bowel: Long segment circumferential colonic wall thickening involving the descending colon with adjacent fat stranding and trace fluid. Stomach and small bowel are within normal limits. A normal appendix is present in the right lower quadrant. Vascular/Lymphatic: Aortic atherosclerosis. No enlarged abdominal or  pelvic lymph nodes. Reproductive: Post treatment changes to the prostate gland. Other: No organized abdominopelvic fluid collection. No pneumoperitoneum. No abdominal wall hernia. Musculoskeletal: No acute or significant osseous findings. IMPRESSION: 1. Long segment circumferential colonic wall thickening involving the descending colon with adjacent fat stranding, suggestive of an infectious or inflammatory colitis. 2. Aortic atherosclerosis (ICD10-I70.0). Electronically Signed   By: Duanne Guess D.O.   On: 11/19/2022 12:55    Scheduled Meds:  apixaban  2.5 mg Oral BID   hydroxychloroquine  200 mg Oral  BID   lisinopril  5 mg Oral Daily   mirtazapine  30 mg Oral QHS   Sirolimus  2 mg Oral Once per day on Mon Wed Fri   And   [START ON 11/22/2022] Sirolimus  1 mg Oral Once per day on Sun Tue Thu Sat   spironolactone  50 mg Oral Daily   vancomycin  125 mg Oral QID   Continuous Infusions:  sodium chloride 75 mL/hr at 11/20/22 1510     LOS: 1 day   Burnadette Pop, MD [facility] P5/07/2022, 11:20 AM

## 2022-11-21 NOTE — Consult Note (Signed)
Renal Service Consult Note [facility]  Kellen Dutch 11/21/2022 Maree Krabbe, MD Requesting Physician: Dr. Renford Dills  Reason for Consult: Renal transplant pt admitted for Cdif infection HPI: The patient is a 62 y.o. year-old w/ PMH as below who presented to ED on 4/29 w/ c/o diarrhea for 1 month, 19 lb wt loss. Pt's main c/o's are diarrhea, loose stools, wt loss, intermittent fevers and chills and some diffuse abd pain. Pt took flagyl 2 different courses. He rec'd IV abx prior to his c-spine neck surgery that was done prior to onset of diarrhea. In ED labs showed +FOB, Cdif Ag and toxin +, wBC 14k and Na 131, creat 2.6.  CT abd showed colonic inflammation / colitis. Temp was 101.5 here. Pt was admitted and started on po vancomycin. We are asked to see for renal transplant.    Pt seen in room, states he had renal transplant in march 2022, hx DVT and HTN. He was not on dialysis prior to the transplant. He is f/b [facility] for the transplant. Lives in [place]. His usual GFR is 33, his best recent creat is 2.5 per patient. His doesn't take steroids because they cause bad vision. His current transplant meds are sirolimus and an IV infusion he takes monthly given here in [place]. The sirolimus is a pill and he is getting proper dose here.    ROS - denies CP, no joint pain, no HA, no blurry vision, no rash, no diarrhea, no nausea/ vomiting, no dysuria, no difficulty voiding   Past Medical History  Past Medical History:  Diagnosis Date   Anemia in chronic kidney disease (CKD)    Blood clot in vein 12/2020   right tibial vein   Chronic kidney disease    s/p renal transplant 10-17-2020   DVT (deep venous thrombosis) (HCC)    in leg   Hypertension    Kidney transplant recipient    Prostate cancer (HCC)    Systemic Lupus [facility])    Past Surgical History  Past Surgical History:  Procedure Laterality Date   colonscopy  2022   GOLD SEED IMPLANT N/A 12/26/2021   Procedure: GOLD SEED  IMPLANT;  Surgeon: Jerilee Field, MD;  Location: [facility];  Service: Urology;  Laterality: N/A;   kidney  transplant  10/17/2020   done @ [facility]   LASIK Bilateral 1999   PROSTATE BIOPSY  2022   RENAL BIOPSY  10/2020   right wrist surgery  2000   SPACE OAR INSTILLATION N/A 12/26/2021   Procedure: SPACE OAR INSTILLATION;  Surgeon: Jerilee Field, MD;  Location: [facility];  Service: Urology;  Laterality: N/A;   Family History  Family History  Problem Relation Age of Onset   Diabetes Mother    Hypertension Father    Diabetes Father    Social History  reports that he has never smoked. He has never used smokeless tobacco. He reports that he does not currently use drugs. No history on file for alcohol use. Allergies  Allergies  Allergen Reactions   Prednisone Swelling    Swelling behind the eye that causes not being able to see   Home medications Prior to Admission medications   Medication Sig Start Date End Date Taking? Authorizing Provider  acetaminophen (TYLENOL) 500 MG tablet Take 500 mg by mouth every 6 (six) hours as needed for fever.   Yes [provider]  apixaban (ELIQUIS) 2.5 MG TABS tablet Take 2.5 mg by mouth every 12 (twelve) hours.  Yes [provider]  eplerenone (INSPRA) 50 MG tablet Take 50 mg by mouth daily. 09/06/20  Yes [provider]  hydroxychloroquine (PLAQUENIL) 200 MG tablet Take 200 mg by mouth 2 (two) times daily. 06/21/16  Yes [provider]  lisinopril (ZESTRIL) 5 MG tablet Take 5 mg by mouth daily.   Yes [provider]  mirtazapine (REMERON) 30 MG tablet Take 30 mg by mouth at bedtime. 08/04/19  Yes [provider]  sirolimus (RAPAMUNE) 1 MG tablet Take 1-2 mg by mouth as directed. Take 1 tablet (1 mg) Daily Except on (MWF) Take 2 tablets (2 mg) 05/18/21  Yes [provider]  zolpidem (AMBIEN) 10 MG tablet Take 10 mg by mouth at bedtime.   Yes [provider]  Belatacept (NULOJIX IV) Inject into the vein. Takes the 12th of every month-infusion    [provider]  ELIQUIS 5 MG TABS tablet Take 1 tablet (5 mg total) by mouth 2 (two) times daily. Take 1/2 tablet or 2.5 mg by mouth 2 (two) times daily Patient not taking: Reported on 11/19/2022 12/28/21   Jerilee Field, MD     Vitals:   11/20/22 2223 11/20/22 2330 11/21/22 0552 11/21/22 1333  BP:   (!) 149/87 132/78  Pulse:   (!) 105 87  Resp:   18 18  Temp: (!) 102.6 F (39.2 C) 99.7 F (37.6 C) 99.3 F (37.4 C) 98.5 F (36.9 C)  TempSrc: Oral Oral Oral   SpO2:   100% 98%  Weight:      Height:       Exam Gen alert, no distress No rash, cyanosis or gangrene Sclera anicteric, throat clear  No jvd or bruits Chest clear bilat to bases, no rales/ wheezing RRR no MRG Abd soft ntnd no mass or ascites +bs, RLQ nontender transplant GU normal male MS no joint effusions or deformity Ext no LE or UE edema, no wounds or ulcers Neuro is alert, Ox 3 , nf       Home meds include - eliquis, ambein, eplerenone 50 qd, plaquenil 200 bid, lisinopril 5 qd, remeron, rapamune (sirolimus) 1mg  on TuThSatSun and 2mg  on MWF, belatacept IV on the 12th day of each month, prns/ vits/ supps     Date   Creat  eGFR    10/2020  3.87  17    11/2020  3.74  17    01/2021  3.39      03/2021  3.40  23    05/2021  3.38  19     08/2021  2.55       11/2021  2.73  25     02/2022  3.01  23     03/2022  2.49  28     06/2022  2.47  29    Aug 09, 2022 2.62  27     11/12/22  2.67  26    11/19/22  2.68  26    11/20/22  2.56  27    11/21/22  2.51  28 ml/min        UA , UNa, UCr pending       Assessment/ Plan: CKD 4 transplant - b/l creat is 2.47- 2.73 from 2023- 24, eGFR 25- 60ml/min. Creat here is 2.51- 2.68 in setting of a 1 month long battle w/ diarrhea and Cdif infection. Creat here is within his baseline range. Looks a bit dry, will bolus 2 L and ^IVF's to 125 cc/hr. Will check UA, urine  lytes and  renal Tx [place].  His only daily transplant med is sirolimus which is being dosed here properly. The other medication is a monthly IV infusion. He is f/b [facility]. Will follow.  Metabolic acidosis - from renal failure and/or diarrhea. Starting bicarb gtt. HTN - taked acei and diuretic at home for HTN, both are on hold and BP's are wnl.  Follow.  Cdif infection / diarrhea - for last month, per pmd / GI    Vinson Moselle  MD [facility] 11/21/2022, 4:28 PM  Recent Labs  Lab 11/19/22 1048 11/20/22 0417 11/21/22 0429  HGB 11.1* 10.5* 10.1*  ALBUMIN 3.8  --   --   CALCIUM 9.1 8.3* 8.1*  PHOS  --   --  3.1  CREATININE 2.68* 2.56* 2.51*  K 4.4 4.2 4.4   Inpatient medications:  apixaban  2.5 mg Oral BID   hydroxychloroquine  200 mg Oral BID   mirtazapine  30 mg Oral QHS   Sirolimus  2 mg Oral Once per day on Mon Wed Fri   And   [START ON 11/22/2022] Sirolimus  1 mg Oral Once per day on Sun Tue Thu Sat   vancomycin  125 mg Oral QID    sodium bicarbonate 150 mEq in sterile water 1,150 mL infusion     acetaminophen **OR** acetaminophen, cloNIDine, ondansetron **OR** ondansetron (ZOFRAN) IV, zolpidem

## 2022-11-22 ENCOUNTER — Inpatient Hospital Stay (HOSPITAL_COMMUNITY): Payer: Commercial Managed Care - PPO

## 2022-11-22 DIAGNOSIS — A0472 Enterocolitis due to Clostridium difficile, not specified as recurrent: Secondary | ICD-10-CM | POA: Diagnosis not present

## 2022-11-22 LAB — BASIC METABOLIC PANEL
Anion gap: 11 (ref 5–15)
BUN: 31 mg/dL — ABNORMAL HIGH (ref 8–23)
CO2: 18 mmol/L — ABNORMAL LOW (ref 22–32)
Calcium: 7.9 mg/dL — ABNORMAL LOW (ref 8.9–10.3)
Chloride: 105 mmol/L (ref 98–111)
Creatinine, Ser: 2.22 mg/dL — ABNORMAL HIGH (ref 0.61–1.24)
GFR, Estimated: 32 mL/min — ABNORMAL LOW (ref >60)
Glucose, Bld: 96 mg/dL (ref 70–99)
Potassium: 3.5 mmol/L (ref 3.5–5.1)
Sodium: 134 mmol/L — ABNORMAL LOW (ref 135–145)

## 2022-11-22 LAB — CULTURE, BLOOD (ROUTINE X 2)
Special Requests: ADEQUATE
Special Requests: ADEQUATE

## 2022-11-22 MED ORDER — B COMPLEX-C PO TABS
1.0000 | ORAL_TABLET | Freq: Every day | ORAL | Status: DC
  Administered 2022-11-22 – 2022-11-23 (×2): 1 via ORAL
  Filled 2022-11-22 (×2): qty 1

## 2022-11-22 MED ORDER — BOOST / RESOURCE BREEZE PO LIQD CUSTOM
1.0000 | Freq: Three times a day (TID) | ORAL | Status: DC
  Administered 2022-11-22 – 2022-11-23 (×2): 1 via ORAL

## 2022-11-22 NOTE — Progress Notes (Addendum)
PROGRESS NOTE  Jordan Hanna  ZOX:[id number] DOB: [birth year] DOA: 11/19/2022 PCP: Pieter Partridge., MD   Brief Narrative: Patient is a 62 year old male with history of SLE, hypertension, DVT on anticoagulation, CKD status post renal transplant who presented with complaint of persistent diarrhea for a month.  On presentation he was febrile.  Labs showed WBC count of 14.5, creatinine of 2.6.  CT abdomen/pelvis without contrast showed circumferential colonic wall thickening involving descending colon suspicious for infectious colitis.  C. difficile PCR came at a positive.  Started on oral vancomycin.  GI was following.  Waiting for resolution of diarrhea and fever before discharge plan.  Assessment & Plan:  Principal Problem:   C. difficile colitis Active Problems:   Anemia in chronic kidney disease (CKD)   Systemic lupus erythematosus (HCC)   Kidney transplanted   Essential hypertension   History of deep vein thrombosis   CKD (chronic kidney disease) stage 4, GFR 15-29 ml/min (HCC)   Depression  Sepsis secondary to C. difficile colitis: Presented with 1 month history of loose stools.  Was taking Flagyl as an outpatient with did not help.  Patient recently took antibiotics prior to cervical spine surgery.  Was febrile, tachycardic on presentation, labs with leukocytosis.  CT abdomen/pelvis showed circumferential colonic wall thickening involving descending colon with adjacent fat stranding suggestive of infectious colitis .  C. difficile came out to be positive.  Currently on oral vancomycin.  Leukocytosis improving. Spiked fever of 101.1 F this mrng.  Will get repeat blood cultures.  Blood culture sent 3 days ago  havenot not shown any growth.  We presume that this fever is from C. difficile colitis.  No other clear source of infection.  On room air, no cough, no dysuria  Hyponatremia: Likely secondary to hypovolemic hyponatremia in the setting of profuse watery diarrhea.  On IV fluids.   Monitor BMP  CKD: Status post renal transplant on 09/2020.  Baseline creatinine ranges from 2.6-2.7.  Currently kidney function at baseline.  On sirolimus on Monday, Wednesday, Friday, 1 mg on TTSS.  Monitor BMP. Nephrology following  Non-anion gap metabolic acidosis: CO2 of 17.  Likely secondary to combination of CKD and diarrhea.  On bicarb drip  History of DVT: On Eliquis  Hypertension: BP stable.  On lisinopril, eplerenone  at home  Anemia of chronic disease: Secondary to CKD.  Currently hemoglobin stable        DVT prophylaxis:apixaban (ELIQUIS) tablet 2.5 mg Start: 11/19/22 2215 apixaban (ELIQUIS) tablet 2.5 mg     Code Status: Full Code  Family Communication: Wife at bedside  Patient status:Inpatient  Patient is from :home  Anticipated discharge WJ:XBJY  Estimated DC date: After resolution of fever, improvement in diarrhea   Consultants: GI, nephrology  Procedures: None  Antimicrobials:  Anti-infectives (From admission, onward)    Start     Dose/Rate Route Frequency Ordered Stop   11/19/22 2215  hydroxychloroquine (PLAQUENIL) tablet 200 mg       Note to Pharmacy: Takes at 1200 pm     200 mg Oral 2 times daily 11/19/22 2122     11/19/22 2215  vancomycin (VANCOCIN) capsule 125 mg        125 mg Oral 4 times daily 11/19/22 2123 11/29/22 2159       Subjective: Patient seen and examined at bedside today.  Hemodynamically stable.  He had 2 episodes of loose stools this morning.  He has spiked a fever of 101 F this morning.  Denies any shortness  of breath, cough or dysuria.  Eager to go home  Objective: Vitals:   11/21/22 1958 11/22/22 0435 11/22/22 0500 11/22/22 0511  BP: (!) 154/89 (!) 154/87    Pulse: 98 96    Resp: 17 18    Temp: (!) 100.4 F (38 C) (!) 101.1 F (38.4 C)    TempSrc: Oral Oral    SpO2: 99% 100%    Weight:   83.2 kg 83.2 kg  Height:        Intake/Output Summary (Last 24 hours) at 11/22/2022 1251 Last data filed at 11/22/2022  0600 Gross per 24 hour  Intake 1150 ml  Output 550 ml  Net 600 ml   Filed Weights   11/19/22 1043 11/22/22 0500 11/22/22 0511  Weight: 76.2 kg 83.2 kg 83.2 kg    Examination:  General exam: Overall comfortable, not in distress HEENT: PERRL Respiratory system:  no wheezes or crackles  Cardiovascular system: S1 & S2 heard, RRR.  Gastrointestinal system: Abdomen is nondistended, soft and is mildly tender Central nervous system: Alert and oriented Extremities: No edema, no clubbing ,no cyanosis Skin: No rashes, no ulcers,no icterus    Data Reviewed: I have personally reviewed following labs and imaging studies  CBC: Recent Labs  Lab 11/19/22 1048 11/20/22 0417 11/21/22 0429  WBC 14.5* 12.1* 12.0*  HGB 11.1* 10.5* 10.1*  HCT 33.5* 32.1* 31.6*  MCV 87.2 89.7 90.5  PLT 258 245 254   Basic Metabolic Panel: Recent Labs  Lab 11/19/22 1048 11/20/22 0417 11/21/22 0429 11/22/22 0412  NA 131* 131* 131* 134*  K 4.4 4.2 4.4 3.5  CL 100 102 104 105  CO2 19* 16* 17* 18*  GLUCOSE 116* 110* 108* 96  BUN 34* 39* 34* 31*  CREATININE 2.68* 2.56* 2.51* 2.22*  CALCIUM 9.1 8.3* 8.1* 7.9*  MG  --   --  2.4  --   PHOS  --   --  3.1  --      Recent Results (from the past 240 hour(s))  C Difficile Quick Screen w PCR reflex     Status: Abnormal   Collection Time: 11/19/22  4:19 PM   Specimen: STOOL  Result Value Ref Range Status   C Diff antigen POSITIVE (A) NEGATIVE Final   C Diff toxin POSITIVE (A) NEGATIVE Final   C Diff interpretation Toxin producing C. difficile detected.  Final    Comment: CRITICAL RESULT CALLED TO, READ BACK BY AND VERIFIED WITH:  C/ A. HAMILTON, RN 11/19/22 1847 A. LAFRANCE Performed at [facility], [address] [place], [place] [address]   Gastrointestinal Panel by PCR , Stool     Status: None   Collection Time: 11/19/22  4:19 PM   Specimen: STOOL  Result Value Ref Range Status   Campylobacter species NOT DETECTED NOT DETECTED Final    Plesimonas shigelloides NOT DETECTED NOT DETECTED Final   Salmonella species NOT DETECTED NOT DETECTED Final   Yersinia enterocolitica NOT DETECTED NOT DETECTED Final   Vibrio species NOT DETECTED NOT DETECTED Final   Vibrio cholerae NOT DETECTED NOT DETECTED Final   Enteroaggregative E coli (EAEC) NOT DETECTED NOT DETECTED Final   Enteropathogenic E coli (EPEC) NOT DETECTED NOT DETECTED Final   Enterotoxigenic E coli (ETEC) NOT DETECTED NOT DETECTED Final   Shiga like toxin producing E coli (STEC) NOT DETECTED NOT DETECTED Final   Shigella/Enteroinvasive E coli (EIEC) NOT DETECTED NOT DETECTED Final   Cryptosporidium NOT DETECTED NOT DETECTED Final  Cyclospora cayetanensis NOT DETECTED NOT DETECTED Final   Entamoeba histolytica NOT DETECTED NOT DETECTED Final   Giardia lamblia NOT DETECTED NOT DETECTED Final   Adenovirus F40/41 NOT DETECTED NOT DETECTED Final   Astrovirus NOT DETECTED NOT DETECTED Final   Norovirus GI/GII NOT DETECTED NOT DETECTED Final   Rotavirus A NOT DETECTED NOT DETECTED Final   Sapovirus (I, II, IV, and V) NOT DETECTED NOT DETECTED Final    Comment: Performed at [facility], [address] [place], [place] [address]  Culture, blood (Routine X 2) w Reflex to ID Panel     Status: None (Preliminary result)   Collection Time: 11/19/22 10:14 PM   Specimen: BLOOD RIGHT ARM  Result Value Ref Range Status   Specimen Description   Final    BLOOD RIGHT ARM Performed at [facility], [address] [place], [place] [address]    Special Requests   Final    BOTTLES DRAWN AEROBIC ONLY Blood Culture adequate volume Performed at [facility], [address] [place], [place] [address]    Culture   Final    NO GROWTH 3 DAYS Performed at [facility], [address] [place], [place] [address]    Report Status PENDING  Incomplete  Culture, blood (Routine X 2) w Reflex to ID Panel     Status: None (Preliminary result)    Collection Time: 11/19/22 10:16 PM   Specimen: BLOOD LEFT HAND  Result Value Ref Range Status   Specimen Description   Final    BLOOD LEFT HAND Performed at [facility], [address] [place], [place] [address]    Special Requests   Final    BOTTLES DRAWN AEROBIC ONLY Blood Culture adequate volume Performed at [facility], [address] [place], [place] [address]    Culture   Final    NO GROWTH 3 DAYS Performed at [facility], [address], [place], [place] [address]    Report Status PENDING  Incomplete     Radiology Studies: No results found.  Scheduled Meds:  apixaban  2.5 mg Oral BID   hydroxychloroquine  200 mg Oral BID   mirtazapine  30 mg Oral QHS   Sirolimus  2 mg Oral Once per day on Mon Wed Fri   And   Sirolimus  1 mg Oral Once per day on Sun Tue Thu Sat   vancomycin  125 mg Oral QID   Continuous Infusions:  sodium bicarbonate 150 mEq in sterile water 1,150 mL infusion 125 mL/hr at 11/21/22 1937     LOS: 2 days   Burnadette Pop, MD [facility] P5/08/2022, 12:51 PM

## 2022-11-22 NOTE — Progress Notes (Signed)
[facility] Progress Note  SUBJECTIVE:   Interval history: Jordan Hanna was seen and evaluated today at bedside.  Noted that he has had continued liquid bowel movements.  Some decrease in frequency.  He is having smaller amount of blood in stool.  He has some abdominal pain still.  Febrile to 38.4 C on 11/22/2022.  Internal medicine recommendations to remain inpatient while febrile.  Past Medical History:  Diagnosis Date   Anemia in chronic kidney disease (CKD)    Blood clot in vein 12/2020   right tibial vein   Chronic kidney disease    s/p renal transplant 10-17-2020   DVT (deep venous thrombosis) (HCC)    in leg   Hypertension    Kidney transplant recipient    Prostate cancer (HCC)    Systemic Lupus (HCC)    Past Surgical History:  Procedure Laterality Date   colonscopy  2022   GOLD SEED IMPLANT N/A 12/26/2021   Procedure: GOLD SEED IMPLANT;  Surgeon: Jerilee Field, MD;  Location: [facility];  Service: Urology;  Laterality: N/A;   kidney  transplant  10/17/2020   done @ [facility]   LASIK Bilateral 1999   PROSTATE BIOPSY  2022   RENAL BIOPSY  10/2020   right wrist surgery  2000   SPACE OAR INSTILLATION N/A 12/26/2021   Procedure: SPACE OAR INSTILLATION;  Surgeon: Jerilee Field, MD;  Location: [facility];  Service: Urology;  Laterality: N/A;   Current Facility-Administered Medications  Medication Dose Route Frequency Provider Last Rate Last Admin   acetaminophen (TYLENOL) tablet 650 mg  650 mg Oral Q6H PRN Agbata, Tochukwu, MD   650 mg at 11/22/22 0500   Or   acetaminophen (TYLENOL) suppository 650 mg  650 mg Rectal Q6H PRN Agbata, Tochukwu, MD       apixaban (ELIQUIS) tablet 2.5 mg  2.5 mg Oral BID Agbata, Tochukwu, MD   2.5 mg at 11/22/22 0935   cloNIDine (CATAPRES) tablet 0.1 mg  0.1 mg Oral Q8H PRN Delano Metz, MD       hydroxychloroquine (PLAQUENIL) tablet 200 mg  200 mg Oral BID Agbata, Tochukwu, MD   200 mg at 11/22/22  0936   mirtazapine (REMERON) tablet 30 mg  30 mg Oral QHS Agbata, Tochukwu, MD   30 mg at 11/21/22 2148   ondansetron (ZOFRAN) tablet 4 mg  4 mg Oral Q6H PRN Agbata, Tochukwu, MD       Or   ondansetron (ZOFRAN) injection 4 mg  4 mg Intravenous Q6H PRN Agbata, Tochukwu, MD       Sirolimus (RAPAMUNE) tablet 2 mg  2 mg Oral Once per day on Mon Wed Fri [place], Alvira Philips, DO   2 mg at 11/21/22 1610   And   Sirolimus (RAPAMUNE) tablet 1 mg  1 mg Oral Once per day on Sun Tue Thu Sat [place], Alvira Philips, DO   1 mg at 11/22/22 9604   sodium bicarbonate 150 mEq in sterile water 1,150 mL infusion   Intravenous Continuous Delano Metz, MD 125 mL/hr at 11/21/22 1937 New Bag at 11/21/22 1937   vancomycin (VANCOCIN) capsule 125 mg  125 mg Oral QID Agbata, Tochukwu, MD   125 mg at 11/22/22 0936   zolpidem (AMBIEN) tablet 10 mg  10 mg Oral QHS PRN Agbata, Tochukwu, MD   10 mg at 11/21/22 2148   Allergies as of 11/19/2022 - Review Complete 11/19/2022  Allergen Reaction Noted   Prednisone Swelling 02/18/2020   Review  of Systems:  Review of Systems  Respiratory:  Negative for shortness of breath.   Cardiovascular:  Negative for chest pain.  Gastrointestinal:  Positive for abdominal pain, blood in stool and diarrhea.    OBJECTIVE:   Temp:  [98.5 F (36.9 C)-101.1 F (38.4 C)] 101.1 F (38.4 C) (05/02 0435) Pulse Rate:  [87-98] 96 (05/02 0435) Resp:  [17-18] 18 (05/02 0435) BP: (132-154)/(78-89) 154/87 (05/02 0435) SpO2:  [98 %-100 %] 100 % (05/02 0435) Weight:  [83.2 kg] 83.2 kg (05/02 0511) Last BM Date : 11/20/22 Physical Exam Constitutional:      General: He is not in acute distress.    Appearance: He is not ill-appearing, toxic-appearing or diaphoretic.  Cardiovascular:     Rate and Rhythm: Normal rate and regular rhythm.  Pulmonary:     Effort: No respiratory distress.     Breath sounds: Normal breath sounds.  Abdominal:     General: Bowel sounds are normal. There is no distension.      Palpations: Abdomen is soft.     Tenderness: There is abdominal tenderness. There is no guarding.  Skin:    General: Skin is warm and dry.  Neurological:     Mental Status: He is alert.     Labs: Recent Labs    11/20/22 0417 11/21/22 0429  WBC 12.1* 12.0*  HGB 10.5* 10.1*  HCT 32.1* 31.6*  PLT 245 254   BMET Recent Labs    11/20/22 0417 11/21/22 0429 11/22/22 0412  NA 131* 131* 134*  K 4.2 4.4 3.5  CL 102 104 105  CO2 16* 17* 18*  GLUCOSE 110* 108* 96  BUN 39* 34* 31*  CREATININE 2.56* 2.51* 2.22*  CALCIUM 8.3* 8.1* 7.9*   LFT No results for input(s): "PROT", "ALBUMIN", "AST", "ALT", "ALKPHOS", "BILITOT", "BILIDIR", "IBILI" in the last 72 hours. PT/INR No results for input(s): "LABPROT", "INR" in the last 72 hours. Diagnostic imaging: No results found.  IMPRESSION: C. difficile colitis, first episode             -First episode of C. difficile colitis, started on vancomycin 125 mg p.o. 4 times daily (day 4 of therapy)             -Risk factors include pre and postoperative antibiotics after cervical discectomy 10/22/2022             -Remains febrile  -Immunosuppressed given history renal transplant SIRS secondary to above History of renal transplant secondary to systemic lupus erythematosus in 2022 History DVT on Eliquis    PLAN: -Continue oral vancomycin as ordered for 10 days (may need taper given immunosuppression) -Discussed vancomycin medication use with nephrology and pharmacy per request of patient, oral vancomycin safe in setting of prior renal transplant -Monitor bowel movements -Okay for diet as tolerated -Trend vitals -Eventual outpatient colonoscopy, follow-up with myself in 1 month after discharge -[facility] team will respectfully sign off and be available for any questions that arise   LOS: 2 days   Liliane Shi, [facility]

## 2022-11-22 NOTE — Progress Notes (Signed)
[facility] Progress Note  Subjective: pt seen in room, no c/o's today. Feeling a bit better. Creat 2.2 this am.   Vitals:   11/22/22 0435 11/22/22 0500 11/22/22 0511 11/22/22 1256  BP: (!) 154/87   (!) 143/76  Pulse: 96   96  Resp: 18   17  Temp: (!) 101.1 F (38.4 C)   98.9 F (37.2 C)  TempSrc: Oral   Oral  SpO2: 100%   100%  Weight:  83.2 kg 83.2 kg   Height:        Exam: Gen alert, no distress No jvd or bruits Chest clear bilat to bases RRR no MRG Abd soft ntnd no mass or ascites +bs, RLQ nontender transplant GU normal male MS no joint effusions or deformity Ext no LE edema Neuro is alert, Ox 3 , nf         Home meds include - eliquis, ambein, eplerenone 50 qd, plaquenil 200 bid, lisinopril 5 qd, remeron, rapamune (sirolimus) 1mg  on TuThSatSun and 2mg  on MWF, belatacept IV on the 12th day of each month, prns/ vits/ supps      Date                          Creat               eGFR    10/2020                      3.87                 17    11/2020                      3.74                 17    01/2021                      3.39                     03/2021                      3.40                 23    05/2021                    3.38                 19     08/2021                     2.55                      11/2021                     2.73                 25     02/2022                     3.01                 23     03/2022  2.49                 28     06/2022                   2.47                 29    Aug 09, 2022            2.62                 27            11/12/22                     2.67                 26    11/19/22                     2.68                 26    11/20/22                     2.56                 27    11/21/22                     2.51                 28 ml/min        UA - prot 100, rbc 6-10, wbc 0-5   UNa 64 , UCr 127   Total I/O's = 1.9 L in and 550 cc UOP = +1.3 L       Assessment/ Plan: CKD 4 transplant  - b/l creat is 2.47- 2.73 from 2023- 24, eGFR 25- 22ml/min. Creat here is 2.51- 2.68 in setting of a 1 month long battle w/ diarrhea and Cdif infection. Pt looked a bit dry on initial exam, gave 2 L bolus which he tolerated. Creat today is 2.2 which is within his baseline range. No new suggestions, will sign off.  Renal transplant immunosuppression - his only oral daily transplant med is sirolimus which is being dosed here properly. The other medication is a once monthly IV infusion, likely belatacept. Pt is f/b [facility].  Metabolic acidosis - from renal failure and/or diarrhea. Getting IV bicarb gtt. If being dc'd soon, would give him NaHCO3 tabs 650 bid for about 1 week.   HTN - taked acei and diuretic at home for HTN. Not getting here and BP's are wnl. If possible would hold both until diarrhea is under control. If needs BP medicine and diarrhea is improving and po intake is good, however, lisinopril I think could be safely restarted.  Cdif infection / diarrhea - for last month, per pmd / GI. Getting po vanc here. There is no risk of damage to the kidney from po vanc (just IV vanc).      Vinson Moselle MD [facility] 11/22/2022, 3:37 PM  Recent Labs  Lab 11/19/22 1048 11/20/22 0417 11/21/22 0429 11/22/22 0412  HGB 11.1* 10.5* 10.1*  --   ALBUMIN 3.8  --   --   --   CALCIUM 9.1 8.3* 8.1* 7.9*  PHOS  --   --  3.1  --   CREATININE 2.68* 2.56* 2.51* 2.22*  K 4.4 4.2 4.4 3.5  No results for input(s): "IRON", "TIBC", "FERRITIN" in the last 168 hours. Inpatient medications:  apixaban  2.5 mg Oral BID   B-complex with vitamin C  1 tablet Oral Daily   feeding supplement  1 Container Oral TID BM   hydroxychloroquine  200 mg Oral BID   mirtazapine  30 mg Oral QHS   Sirolimus  2 mg Oral Once per day on Mon Wed Fri   And   Sirolimus  1 mg Oral Once per day on Sun Tue Thu Sat   vancomycin  125 mg Oral QID    sodium bicarbonate 150 mEq in sterile water 1,150 mL infusion 125 mL/hr at 11/21/22 1937    acetaminophen **OR** acetaminophen, cloNIDine, ondansetron **OR** ondansetron (ZOFRAN) IV, zolpidem

## 2022-11-22 NOTE — Progress Notes (Signed)
Initial Nutrition Assessment  INTERVENTION:   -Boost Breeze po TID, each supplement provides 250 kcal and 9 grams of protein   -B complex w/ Vitamin C daily  NUTRITION DIAGNOSIS:   Increased nutrient needs related to acute illness, diarrhea (C.diff) as evidenced by estimated needs.  GOAL:   Patient will meet greater than or equal to 90% of their needs  MONITOR:   PO intake, Supplement acceptance, Labs, Weight trends, I & O's  REASON FOR ASSESSMENT:   Malnutrition Screening Tool    ASSESSMENT:   62 year old male with history of SLE, hypertension, DVT on anticoagulation, CKD status post renal transplant who presented with complaint of persistent diarrhea for a month.  On presentation he was febrile.CT abdomen/pelvis without contrast showed circumferential colonic wall thickening involving descending colon suspicious for infectious colitis.  C. difficile PCR came at a positive.  Patient reports diarrhea for the past month. S/p renal transplant at [facility] in 2022. Pt also is s/p cervical discectomy on 4/1. Was placed on antibiotics and symptoms soon afterwards. Continues to have loose stools, some with blood.  Will place order for [facility] with daily B complex w/ C as well.   Per weight records, pt's weight had been trending up.  Medications: Remeron  Labs reviewed: Low Na  C.diff +  NUTRITION - FOCUSED PHYSICAL EXAM:  Unable to complete, working remotely.  Diet Order:   Diet Order             Diet regular Room service appropriate? Yes; Fluid consistency: Thin  Diet effective now                   EDUCATION NEEDS:   No education needs have been identified at this time  Skin:  Skin Assessment: Reviewed RN Assessment  Last BM:  5/2 -type 7  Height:   Ht Readings from Last 1 Encounters:  11/19/22 5\' 9"  (1.753 m)    Weight:   Wt Readings from Last 1 Encounters:  11/22/22 83.2 kg    BMI:  Body mass index is 27.09 kg/m.  Estimated Nutritional  Needs:   Kcal:  2000-2200  Protein:  75-85g  Fluid:  2L/day   Tilda Franco, MS, RD, LDN Inpatient Clinical Dietitian Contact information available via Amion

## 2022-11-23 DIAGNOSIS — A0472 Enterocolitis due to Clostridium difficile, not specified as recurrent: Secondary | ICD-10-CM | POA: Diagnosis not present

## 2022-11-23 LAB — CULTURE, BLOOD (ROUTINE X 2)
Culture: NO GROWTH
Culture: NO GROWTH
Special Requests: ADEQUATE

## 2022-11-23 LAB — BASIC METABOLIC PANEL
Anion gap: 12 (ref 5–15)
BUN: 26 mg/dL — ABNORMAL HIGH (ref 8–23)
CO2: 26 mmol/L (ref 22–32)
Calcium: 7.9 mg/dL — ABNORMAL LOW (ref 8.9–10.3)
Chloride: 99 mmol/L (ref 98–111)
Creatinine, Ser: 2.1 mg/dL — ABNORMAL HIGH (ref 0.61–1.24)
GFR, Estimated: 35 mL/min — ABNORMAL LOW (ref >60)
Glucose, Bld: 104 mg/dL — ABNORMAL HIGH (ref 70–99)
Potassium: 2.8 mmol/L — ABNORMAL LOW (ref 3.5–5.1)
Sodium: 137 mmol/L (ref 135–145)

## 2022-11-23 LAB — CBC
HCT: 28.1 % — ABNORMAL LOW (ref 39.0–52.0)
Hemoglobin: 9.4 g/dL — ABNORMAL LOW (ref 13.0–17.0)
MCH: 29.2 pg (ref 26.0–34.0)
MCHC: 33.5 g/dL (ref 30.0–36.0)
MCV: 87.3 fL (ref 80.0–100.0)
Platelets: 250 10*3/uL (ref 150–400)
RBC: 3.22 MIL/uL — ABNORMAL LOW (ref 4.22–5.81)
RDW: 14.2 % (ref 11.5–15.5)
WBC: 5.6 10*3/uL (ref 4.0–10.5)
nRBC: 0 % (ref 0.0–0.2)

## 2022-11-23 LAB — MAGNESIUM: Magnesium: 2.1 mg/dL (ref 1.7–2.4)

## 2022-11-23 MED ORDER — POTASSIUM CHLORIDE CRYS ER 20 MEQ PO TBCR: 40.0000 meq | EXTENDED_RELEASE_TABLET | ORAL | Status: DC

## 2022-11-23 MED ORDER — POTASSIUM CHLORIDE CRYS ER 20 MEQ PO TBCR: 40.0000 meq | EXTENDED_RELEASE_TABLET | Freq: Every day | ORAL | 0 refills | Status: AC

## 2022-11-23 MED ORDER — VANCOMYCIN HCL 125 MG PO CAPS: 125.0000 mg | ORAL_CAPSULE | Freq: Four times a day (QID) | ORAL | 0 refills | Status: AC

## 2022-11-23 MED ORDER — POTASSIUM CHLORIDE CRYS ER 20 MEQ PO TBCR
40.0000 meq | EXTENDED_RELEASE_TABLET | ORAL | Status: AC
  Administered 2022-11-23 (×2): 40 meq via ORAL
  Filled 2022-11-23 (×2): qty 2

## 2022-11-23 MED ORDER — SODIUM BICARBONATE 650 MG PO TABS: 650.0000 mg | ORAL_TABLET | Freq: Two times a day (BID) | ORAL | 0 refills | Status: AC

## 2022-11-23 NOTE — Progress Notes (Signed)
   11/23/22 0524  Assess: MEWS Score  Temp (!) 101 F (38.3 C)  BP (!) 160/94  MAP (mmHg) 114  Pulse Rate (!) 102  Resp 19  SpO2 99 %  O2 Device Room Air  Assess: MEWS Score  MEWS Temp 1  MEWS Systolic 0  MEWS Pulse 1  MEWS RR 0  MEWS LOC 0  MEWS Score 2  MEWS Score Color Yellow  Assess: if the MEWS score is Yellow or Red  Were vital signs taken at a resting state? Yes  Focused Assessment Change from prior assessment (see assessment flowsheet)  Does the patient meet 2 or more of the SIRS criteria? Yes  Does the patient have a confirmed or suspected source of infection? Yes  MEWS guidelines implemented  Yes, yellow  Treat  MEWS Interventions Considered administering scheduled or prn medications/treatments as ordered  Take Vital Signs  Increase Vital Sign Frequency  Yellow: Q2hr x1, continue Q4hrs until patient remains green for 12hrs  Escalate  MEWS: Escalate Yellow: Discuss with charge nurse and consider notifying provider and/or RRT  Notify: Charge Nurse/RN  Name of Charge Nurse/RN Notified Luna Kitchens, RN  Provider Notification  Provider Name/Title A. Virgel Manifold, NP  Date Provider Notified 11/23/22  Time Provider Notified [phone]  Method of Notification Page  Notification Reason Other (Comment) (Yellow MEWS)Temp 101 F and HR 102)  Provider response No new orders  Date of Provider Response 11/23/22  Time of Provider Response 0610  Assess: SIRS CRITERIA  SIRS Temperature  1  SIRS Pulse 1  SIRS Respirations  0  SIRS WBC 0  SIRS Score Sum  2

## 2022-11-23 NOTE — Care Management Important Message (Signed)
Important Message  Patient Details IM Letter given to the Patient. Name: Jordan Hanna MRN: [id number] Date of Birth: 02-17-1961   Medicare Important Message Given:  Yes     [name] 11/23/2022, 9:30 AM

## 2022-11-23 NOTE — Discharge Summary (Signed)
Physician Discharge Summary  Dmir Rief GNF:[id number] DOB: 1960-07-26 DOA: 11/19/2022  PCP: Pieter Partridge., MD  Admit date: 11/19/2022 Discharge date: 11/23/2022  Admitted From: Home Disposition:  Home  Discharge Condition:Stable CODE STATUS:FULL Diet recommendation: Regular   Brief/Interim Summary: Patient is a 62 year old male with history of SLE, hypertension, DVT on anticoagulation, CKD status post renal transplant who presented with complaint of persistent diarrhea for a month. On presentation he was febrile. Labs showed WBC count of 14.5, creatinine of 2.6. CT abdomen/pelvis without contrast showed circumferential colonic wall thickening involving descending colon suspicious for infectious colitis. C. difficile PCR came at a positive. Started on oral vancomycin. GI,nephrology were following.  Hospital course remarkable for slow improvement in the diarrhea and also intermittent fever.  Blood cultures have remained negative so far.  Patient is desperate to go home.  Patient be discharged with oral vancomycin.  I have recommended him to follow-up with his PCP on Monday.   Following problems were addressed during the hospitalization:  Sepsis secondary to C. difficile colitis: Presented with 1 month history of loose stools.  Was taking Flagyl as an outpatient with did not help.  Patient recently took antibiotics prior to cervical spine surgery.  Was febrile, tachycardic on presentation, labs with leukocytosis.  CT abdomen/pelvis showed circumferential colonic wall thickening involving descending colon with adjacent fat stranding suggestive of infectious colitis .  C. difficile came out to be positive.  Currently on oral vancomycin.  Leukocytosis improving. Blood culture sent 4 days ago  havenot not shown any growth.  We presume that this fever is from C. difficile colitis.  Repeat blood culture sent on 5/2 have remained negative so far. no other clear source of infection.  On room air,  no cough, no dysuria   Hyponatremia: Likely secondary to hypovolemic hyponatremia in the setting of profuse watery diarrhea.  Stable now   CKD: Status post renal transplant on 09/2020.  Baseline creatinine ranges from 2.6-2.7.  Currently kidney function at baseline.  On sirolimus on Monday, Wednesday, Friday, 1 mg on TTSS.  Monitor BMP. Nephrology was following.We recommend to do a BMP test in a week   Non-anion gap metabolic acidosis: Likely secondary to combination of CKD and diarrhea.  Was on bicarb drip, will discharge him with few days of oral sodium bicarb tablets because he is having ongoing diarrhea  History of DVT: On Eliquis   Hypertension: BP stable.  On lisinopril, eplerenone  at home,currently on hold   Anemia of chronic disease: Secondary to CKD.  Currently hemoglobin stable  Hypokalemia: Being supplemented with potassium.Check BMP in a  week   Discharge Diagnoses:  Principal Problem:   C. difficile colitis Active Problems:   Anemia in chronic kidney disease (CKD)   Systemic lupus erythematosus (HCC)   Kidney transplanted   Essential hypertension   History of deep vein thrombosis   CKD (chronic kidney disease) stage 4, GFR 15-29 ml/min [facility])   Depression    Discharge Instructions  Discharge Instructions     Diet general   Complete by: As directed    Discharge instructions   Complete by: As directed    1)Please take prescribed medications as instructed 2)Follow up with your PCP on  Monday( 11/26/22). Do a BMP test during the follow up 3)Monitor your blood pressure at home 4)If you continue to have fever or severe diarrhea, come to the emergency department 5)Follow up with gastroenterology as an outpatient 6)Follow up with your nephrologist   Increase activity slowly  Complete by: As directed       Allergies as of 11/23/2022       Reactions   Prednisone Swelling   Swelling behind the eye that causes not being able to see        Medication List      STOP taking these medications    eplerenone 50 MG tablet Commonly known as: INSPRA   lisinopril 5 MG tablet Commonly known as: ZESTRIL       TAKE these medications    acetaminophen 500 MG tablet Commonly known as: TYLENOL Take 500 mg by mouth every 6 (six) hours as needed for fever.   Eliquis 2.5 MG Tabs tablet Generic drug: apixaban Take 2.5 mg by mouth every 12 (twelve) hours. What changed: Another medication with the same name was removed. Continue taking this medication, and follow the directions you see here.   hydroxychloroquine 200 MG tablet Commonly known as: PLAQUENIL Take 200 mg by mouth 2 (two) times daily.   mirtazapine 30 MG tablet Commonly known as: REMERON Take 30 mg by mouth at bedtime.   NULOJIX IV Inject into the vein. Takes the 12th of every month-infusion   potassium chloride SA 20 MEQ tablet Commonly known as: KLOR-CON M Take 2 tablets (40 mEq total) by mouth daily for 5 days. Start taking on: Nov 24, 2022   sirolimus 1 MG tablet Commonly known as: RAPAMUNE Take 1-2 mg by mouth as directed. Take 1 tablet (1 mg) Daily Except on (MWF) Take 2 tablets (2 mg)   sodium bicarbonate 650 MG tablet Take 1 tablet (650 mg total) by mouth 2 (two) times daily for 7 days.   vancomycin 125 MG capsule Commonly known as: VANCOCIN Take 1 capsule (125 mg total) by mouth 4 (four) times daily for 6 days.   zolpidem 10 MG tablet Commonly known as: AMBIEN Take 10 mg by mouth at bedtime.        Follow-up Information     Pieter Partridge., MD. Schedule an appointment as soon as possible for a visit in 1 week(s).   Contact information: [address]  [address] [place] [address] [phone]                Allergies  Allergen Reactions   Prednisone Swelling    Swelling behind the eye that causes not being able to see    Consultations: GI, nephrology   Procedures/Studies: US Renal Transplant w/Doppler  Result Date:  11/22/2022 CLINICAL DATA:  [id number], [id number]. Stage IV chronic renal failure. Renal transplant recipient, surgery date 10/17/2020 at [facility]. EXAM: ULTRASOUND OF RENAL TRANSPLANT WITH RENAL DOPPLER ULTRASOUND TECHNIQUE: Ultrasound examination of the renal transplant was performed with gray-scale, color and duplex doppler evaluation. COMPARISON:  None Available. FINDINGS: Transplant kidney location: Right iliac fossa. Transplant Kidney: Renal measurements: 9.9 x 6.3 x 5.7 cm = volume: 185.28mL. There is mild increased cortical echogenicity with heterogeneity, without visible cortical thinning. No evidence of mass or hydronephrosis. No peri-transplant fluid collection seen. Color flow in the main renal artery:  Yes. Color flow in the main renal vein:  Yes. Duplex Doppler Evaluation: Main Renal Artery Velocity: 223.4 cm/sec Main Renal Artery Resistive Index: 0.77 Venous waveform in main renal vein:  present Intrarenal resistive index in upper pole:  0.64 (normal 0.6-0.8; equivocal 0.8-0.9; abnormal >= 0.9) Intrarenal resistive index in lower pole: 0.62 (normal 0.6-0.8; equivocal 0.8-0.9; abnormal >= 0.9) Bladder: Appears normal for degree of bladder distention. A ureteral jet however, was not  seen during the study. Other findings:  None. IMPRESSION: 1. Mild increased cortical echogenicity with heterogeneity of the transplant kidney, without visible cortical thinning. This is nonspecific but can be seen in the setting of medical renal disease, including pyelonephritis. 2. No evidence of hydronephrosis or peritransplant fluid collection. 3. Normal resistive indices in the transplant kidney. Within normal limits main renal artery velocity. 4. A ureteral jet was not seen during the study. Electronically Signed   By: Almira Bar M.D.   On: 11/22/2022 22:04   CT ABDOMEN PELVIS WO CONTRAST  Result Date: 11/19/2022 CLINICAL DATA:  Abdominal pain, acute, nonlocalized non localized abdominal pain EXAM: CT ABDOMEN AND PELVIS  WITHOUT CONTRAST TECHNIQUE: Multidetector CT imaging of the abdomen and pelvis was performed following the standard protocol without IV contrast. RADIATION DOSE REDUCTION: This exam was performed according to the departmental dose-optimization program which includes automated exposure control, adjustment of the mA and/or kV according to patient size and/or use of iterative reconstruction technique. COMPARISON:  11/02/2021 FINDINGS: Lower chest: No acute abnormality. Hepatobiliary: Unremarkable unenhanced appearance of the liver. No focal liver lesion identified. Gallbladder within normal limits. No hyperdense gallstone. No biliary dilatation. Pancreas: Unremarkable. No pancreatic ductal dilatation or surrounding inflammatory changes. Spleen: Normal in size without focal abnormality. Adrenals/Urinary Tract: Unremarkable adrenal glands. Native kidneys are atrophic. Simple cyst within the left kidney which do not require follow-up imaging. No renal stone or hydronephrosis. Transplant kidney in the right iliac fossa without hydronephrosis. Urinary bladder within normal limits. Stomach/Bowel: Long segment circumferential colonic wall thickening involving the descending colon with adjacent fat stranding and trace fluid. Stomach and small bowel are within normal limits. A normal appendix is present in the right lower quadrant. Vascular/Lymphatic: Aortic atherosclerosis. No enlarged abdominal or pelvic lymph nodes. Reproductive: Post treatment changes to the prostate gland. Other: No organized abdominopelvic fluid collection. No pneumoperitoneum. No abdominal wall hernia. Musculoskeletal: No acute or significant osseous findings. IMPRESSION: 1. Long segment circumferential colonic wall thickening involving the descending colon with adjacent fat stranding, suggestive of an infectious or inflammatory colitis. 2. Aortic atherosclerosis (ICD10-I70.0). Electronically Signed   By: Duanne Guess D.O.   On: 11/19/2022 12:55       Subjective: Patient seen and examined at bedside today.  Hemodynamically stable for discharge.  Desperate to go home  Discharge Exam: Vitals:   11/23/22 0640 11/23/22 0746  BP:  133/81  Pulse:  92  Resp:  18  Temp: 98.6 F (37 C) 98.6 F (37 C)  SpO2:  96%   Vitals:   11/22/22 2138 11/23/22 0524 11/23/22 0640 11/23/22 0746  BP:  (!) 160/94  133/81  Pulse:  (!) 102  92  Resp:  19  18  Temp:  (!) 101 F (38.3 C) 98.6 F (37 C) 98.6 F (37 C)  TempSrc:  Oral Oral Oral  SpO2: 98% 99%  96%  Weight:      Height:        General: Pt is alert, awake, not in acute distress Cardiovascular: RRR, S1/S2 +, no rubs, no gallops Respiratory: CTA bilaterally, no wheezing, no rhonchi Abdominal: Soft, NT, ND, bowel sounds + Extremities: no edema, no cyanosis    The results of significant diagnostics from this hospitalization (including imaging, microbiology, ancillary and laboratory) are listed below for reference.     Microbiology: Recent Results (from the past 240 hour(s))  C Difficile Quick Screen w PCR reflex     Status: Abnormal   Collection Time: 11/19/22  4:19 PM  Specimen: STOOL  Result Value Ref Range Status   C Diff antigen POSITIVE (A) NEGATIVE Final   C Diff toxin POSITIVE (A) NEGATIVE Final   C Diff interpretation Toxin producing C. difficile detected.  Final    Comment: CRITICAL RESULT CALLED TO, READ BACK BY AND VERIFIED WITH:  C/ A. HAMILTON, RN 11/19/22 1847 A. LAFRANCE Performed at [facility], [address], [place], [place] [address]   Gastrointestinal Panel by PCR , Stool     Status: None   Collection Time: 11/19/22  4:19 PM   Specimen: STOOL  Result Value Ref Range Status   Campylobacter species NOT DETECTED NOT DETECTED Final   Plesimonas shigelloides NOT DETECTED NOT DETECTED Final   Salmonella species NOT DETECTED NOT DETECTED Final   Yersinia enterocolitica NOT DETECTED NOT DETECTED Final   Vibrio species NOT DETECTED NOT DETECTED  Final   Vibrio cholerae NOT DETECTED NOT DETECTED Final   Enteroaggregative E coli (EAEC) NOT DETECTED NOT DETECTED Final   Enteropathogenic E coli (EPEC) NOT DETECTED NOT DETECTED Final   Enterotoxigenic E coli (ETEC) NOT DETECTED NOT DETECTED Final   Shiga like toxin producing E coli (STEC) NOT DETECTED NOT DETECTED Final   Shigella/Enteroinvasive E coli (EIEC) NOT DETECTED NOT DETECTED Final   Cryptosporidium NOT DETECTED NOT DETECTED Final   Cyclospora cayetanensis NOT DETECTED NOT DETECTED Final   Entamoeba histolytica NOT DETECTED NOT DETECTED Final   Giardia lamblia NOT DETECTED NOT DETECTED Final   Adenovirus F40/41 NOT DETECTED NOT DETECTED Final   Astrovirus NOT DETECTED NOT DETECTED Final   Norovirus GI/GII NOT DETECTED NOT DETECTED Final   Rotavirus A NOT DETECTED NOT DETECTED Final   Sapovirus (I, II, IV, and V) NOT DETECTED NOT DETECTED Final    Comment: Performed at [facility], [address] [place], [place] [address]  Culture, blood (Routine X 2) w Reflex to ID Panel     Status: None (Preliminary result)   Collection Time: 11/19/22 10:14 PM   Specimen: BLOOD RIGHT ARM  Result Value Ref Range Status   Specimen Description   Final    BLOOD RIGHT ARM Performed at [facility], [address] [place], [place] [address]    Special Requests   Final    BOTTLES DRAWN AEROBIC ONLY Blood Culture adequate volume Performed at [facility], [address], [place], [place] [address]    Culture   Final    NO GROWTH 4 DAYS Performed at [facility], [address], [place], [place] [address]    Report Status PENDING  Incomplete  Culture, blood (Routine X 2) w Reflex to ID Panel     Status: None (Preliminary result)   Collection Time: 11/19/22 10:16 PM   Specimen: BLOOD LEFT HAND  Result Value Ref Range Status   Specimen Description   Final    BLOOD LEFT HAND Performed at [facility], [address]  [place], [place] [address]    Special Requests   Final    BOTTLES DRAWN AEROBIC ONLY Blood Culture adequate volume Performed at [facility], [address], [place], [place] [address]    Culture   Final    NO GROWTH 4 DAYS Performed at [facility], [address], [place], [place] [address]    Report Status PENDING  Incomplete  Culture, blood (Routine X 2) w Reflex to ID Panel     Status: None (Preliminary result)  Collection Time: 11/22/22  2:02 PM   Specimen: BLOOD LEFT ARM  Result Value Ref Range Status   Specimen Description   Final    BLOOD LEFT ARM Performed at [facility], [address] [place], [place] [address]    Special Requests   Final    BOTTLES DRAWN AEROBIC AND ANAEROBIC Blood Culture adequate volume Performed at [facility], [address], [place], [place] [address]    Culture   Final    NO GROWTH < 24 HOURS Performed at [facility], [address], [place], [place] [address]    Report Status PENDING  Incomplete  Culture, blood (Routine X 2) w Reflex to ID Panel     Status: None (Preliminary result)   Collection Time: 11/22/22  2:02 PM   Specimen: BLOOD RIGHT ARM  Result Value Ref Range Status   Specimen Description   Final    BLOOD RIGHT ARM Performed at [facility], [address], [place], [place] [address]    Special Requests   Final    BOTTLES DRAWN AEROBIC AND ANAEROBIC Blood Culture adequate volume Performed at [facility], [address] [place], [place] [address]    Culture   Final    NO GROWTH < 24 HOURS Performed at [facility], [address], [place], [place] [address]    Report Status PENDING  Incomplete     Labs: BNP (last 3 results) No results for input(s): "BNP" in the last 8760 hours. Basic Metabolic Panel: Recent Labs  Lab 11/19/22 1048 11/20/22 0417 11/21/22 0429 11/22/22 0412 11/23/22 0408  NA 131* 131* 131* 134* 137  K 4.4 4.2  4.4 3.5 2.8*  CL 100 102 104 105 99  CO2 19* 16* 17* 18* 26  GLUCOSE 116* 110* 108* 96 104*  BUN 34* 39* 34* 31* 26*  CREATININE 2.68* 2.56* 2.51* 2.22* 2.10*  CALCIUM 9.1 8.3* 8.1* 7.9* 7.9*  MG  --   --  2.4  --  2.1  PHOS  --   --  3.1  --   --    Liver Function Tests: Recent Labs  Lab 11/19/22 1048  AST 18  ALT 13  ALKPHOS 100  BILITOT 1.6*  PROT 7.2  ALBUMIN 3.8   Recent Labs  Lab 11/19/22 1048  LIPASE 13   No results for input(s): "AMMONIA" in the last 168 hours. CBC: Recent Labs  Lab 11/19/22 1048 11/20/22 0417 11/21/22 0429 11/23/22 0408  WBC 14.5* 12.1* 12.0* 5.6  HGB 11.1* 10.5* 10.1* 9.4*  HCT 33.5* 32.1* 31.6* 28.1*  MCV 87.2 89.7 90.5 87.3  PLT 258 245 254 250   Cardiac Enzymes: No results for input(s): "CKTOTAL", "CKMB", "CKMBINDEX", "TROPONINI" in the last 168 hours. BNP: Invalid input(s): "POCBNP" CBG: No results for input(s): "GLUCAP" in the last 168 hours. D-Dimer No results for input(s): "DDIMER" in the last 72 hours. Hgb A1c No results for input(s): "HGBA1C" in the last 72 hours. Lipid Profile No results for input(s): "CHOL", "HDL", "LDLCALC", "TRIG", "CHOLHDL", "LDLDIRECT" in the last 72 hours. Thyroid function studies No results for input(s): "TSH", "T4TOTAL", "T3FREE", "THYROIDAB" in the last 72 hours.  Invalid input(s): "FREET3" Anemia work up No results for input(s): "VITAMINB12", "FOLATE", "FERRITIN", "TIBC", "IRON", "RETICCTPCT" in the last 72 hours. Urinalysis    Component Value Date/Time   COLORURINE YELLOW 11/21/2022 1839   APPEARANCEUR CLEAR 11/21/2022 1839   LABSPEC 1.017 11/21/2022 1839  PHURINE 5.0 11/21/2022 1839   GLUCOSEU NEGATIVE 11/21/2022 1839   HGBUR SMALL (A) 11/21/2022 1839   BILIRUBINUR NEGATIVE 11/21/2022 1839   KETONESUR 5 (A) 11/21/2022 1839   PROTEINUR 100 (A) 11/21/2022 1839   NITRITE NEGATIVE 11/21/2022 1839   LEUKOCYTESUR NEGATIVE 11/21/2022 1839   Sepsis Labs Recent Labs  Lab 11/19/22 1048  11/20/22 0417 11/21/22 0429 11/23/22 0408  WBC 14.5* 12.1* 12.0* 5.6   Microbiology Recent Results (from the past 240 hour(s))  C Difficile Quick Screen w PCR reflex     Status: Abnormal   Collection Time: 11/19/22  4:19 PM   Specimen: STOOL  Result Value Ref Range Status   C Diff antigen POSITIVE (A) NEGATIVE Final   C Diff toxin POSITIVE (A) NEGATIVE Final   C Diff interpretation Toxin producing C. difficile detected.  Final    Comment: CRITICAL RESULT CALLED TO, READ BACK BY AND VERIFIED WITH:  C/ A. HAMILTON, RN 11/19/22 1847 A. LAFRANCE Performed at [facility], [address] [place], [place] [address]   Gastrointestinal Panel by PCR , Stool     Status: None   Collection Time: 11/19/22  4:19 PM   Specimen: STOOL  Result Value Ref Range Status   Campylobacter species NOT DETECTED NOT DETECTED Final   Plesimonas shigelloides NOT DETECTED NOT DETECTED Final   Salmonella species NOT DETECTED NOT DETECTED Final   Yersinia enterocolitica NOT DETECTED NOT DETECTED Final   Vibrio species NOT DETECTED NOT DETECTED Final   Vibrio cholerae NOT DETECTED NOT DETECTED Final   Enteroaggregative E coli (EAEC) NOT DETECTED NOT DETECTED Final   Enteropathogenic E coli (EPEC) NOT DETECTED NOT DETECTED Final   Enterotoxigenic E coli (ETEC) NOT DETECTED NOT DETECTED Final   Shiga like toxin producing E coli (STEC) NOT DETECTED NOT DETECTED Final   Shigella/Enteroinvasive E coli (EIEC) NOT DETECTED NOT DETECTED Final   Cryptosporidium NOT DETECTED NOT DETECTED Final   Cyclospora cayetanensis NOT DETECTED NOT DETECTED Final   Entamoeba histolytica NOT DETECTED NOT DETECTED Final   Giardia lamblia NOT DETECTED NOT DETECTED Final   Adenovirus F40/41 NOT DETECTED NOT DETECTED Final   Astrovirus NOT DETECTED NOT DETECTED Final   Norovirus GI/GII NOT DETECTED NOT DETECTED Final   Rotavirus A NOT DETECTED NOT DETECTED Final   Sapovirus (I, II, IV, and V) NOT DETECTED NOT DETECTED Final     Comment: Performed at [facility], [address] [place], [place] [address]  Culture, blood (Routine X 2) w Reflex to ID Panel     Status: None (Preliminary result)   Collection Time: 11/19/22 10:14 PM   Specimen: BLOOD RIGHT ARM  Result Value Ref Range Status   Specimen Description   Final    BLOOD RIGHT ARM Performed at [facility], [address] [place], [place] [address]    Special Requests   Final    BOTTLES DRAWN AEROBIC ONLY Blood Culture adequate volume Performed at [facility], [address] [place], [place] [address]    Culture   Final    NO GROWTH 4 DAYS Performed at [facility], [address], [place], [place] [address]    Report Status PENDING  Incomplete  Culture, blood (Routine X 2) w Reflex to ID Panel     Status: None (Preliminary result)   Collection Time: 11/19/22 10:16 PM   Specimen: BLOOD LEFT HAND  Result Value Ref Range Status   Specimen Description   Final  BLOOD LEFT HAND Performed at [facility], [address], [place], [place] [address]    Special Requests   Final    BOTTLES DRAWN AEROBIC ONLY Blood Culture adequate volume Performed at [facility], [address] [place], [place] [address]    Culture   Final    NO GROWTH 4 DAYS Performed at [facility], [address] [place], [place] [address]    Report Status PENDING  Incomplete  Culture, blood (Routine X 2) w Reflex to ID Panel     Status: None (Preliminary result)   Collection Time: 11/22/22  2:02 PM   Specimen: BLOOD LEFT ARM  Result Value Ref Range Status   Specimen Description   Final    BLOOD LEFT ARM Performed at [facility], [address] [place], [place] [address]    Special Requests   Final    BOTTLES DRAWN AEROBIC AND ANAEROBIC Blood Culture adequate volume Performed at [facility], [address], [place], [place] [address]    Culture   Final    NO  GROWTH < 24 HOURS Performed at [facility], [address] [place], [place] [address]    Report Status PENDING  Incomplete  Culture, blood (Routine X 2) w Reflex to ID Panel     Status: None (Preliminary result)   Collection Time: 11/22/22  2:02 PM   Specimen: BLOOD RIGHT ARM  Result Value Ref Range Status   Specimen Description   Final    BLOOD RIGHT ARM Performed at [facility], [address] [place], [place] [address]    Special Requests   Final    BOTTLES DRAWN AEROBIC AND ANAEROBIC Blood Culture adequate volume Performed at [facility], [address], [place], [place] [address]    Culture   Final    NO GROWTH < 24 HOURS Performed at [facility], [address] [place], [place] [address]    Report Status PENDING  Incomplete    Please note: You were cared for by a hospitalist during your hospital stay. Once you are discharged, your primary care physician will handle any further medical issues. Please note that NO REFILLS for any discharge medications will be authorized once you are discharged, as it is imperative that you return to your primary care physician (or establish a relationship with a primary care physician if you do not have one) for your post hospital discharge needs so that they can reassess your need for medications and monitor your lab values.    Time coordinating discharge: 40 minutes  SIGNED:   Burnadette Pop, MD  [facility] 11/23/2022, 11:11 AM Pager [phone]  If 7PM-7AM, please contact night-coverage [email or web address] Password [email or web address]

## 2022-11-24 LAB — CULTURE, BLOOD (ROUTINE X 2)
Culture: NO GROWTH
Special Requests: ADEQUATE

## 2022-11-27 LAB — CULTURE, BLOOD (ROUTINE X 2)

## 2023-01-27 ENCOUNTER — Encounter (HOSPITAL_BASED_OUTPATIENT_CLINIC_OR_DEPARTMENT_OTHER): Payer: Self-pay | Admitting: Emergency Medicine

## 2023-01-27 ENCOUNTER — Other Ambulatory Visit: Payer: Self-pay

## 2023-01-27 ENCOUNTER — Emergency Department (HOSPITAL_BASED_OUTPATIENT_CLINIC_OR_DEPARTMENT_OTHER)
Admission: EM | Admit: 2023-01-27 | Discharge: 2023-01-27 | Payer: Commercial Managed Care - PPO | Attending: Emergency Medicine | Admitting: Emergency Medicine

## 2023-01-27 DIAGNOSIS — E860 Dehydration: Secondary | ICD-10-CM | POA: Diagnosis not present

## 2023-01-27 DIAGNOSIS — R634 Abnormal weight loss: Secondary | ICD-10-CM | POA: Insufficient documentation

## 2023-01-27 DIAGNOSIS — R627 Adult failure to thrive: Secondary | ICD-10-CM | POA: Diagnosis not present

## 2023-01-27 DIAGNOSIS — E876 Hypokalemia: Secondary | ICD-10-CM | POA: Diagnosis not present

## 2023-01-27 DIAGNOSIS — R531 Weakness: Secondary | ICD-10-CM | POA: Diagnosis not present

## 2023-01-27 DIAGNOSIS — Z7901 Long term (current) use of anticoagulants: Secondary | ICD-10-CM | POA: Diagnosis not present

## 2023-01-27 DIAGNOSIS — Z940 Kidney transplant status: Secondary | ICD-10-CM | POA: Diagnosis not present

## 2023-01-27 DIAGNOSIS — R5383 Other fatigue: Secondary | ICD-10-CM | POA: Diagnosis present

## 2023-01-27 LAB — CBC WITH DIFFERENTIAL/PLATELET
Abs Immature Granulocytes: 0.05 10*3/uL (ref 0.00–0.07)
Basophils Absolute: 0 10*3/uL (ref 0.0–0.1)
Basophils Relative: 0 %
Eosinophils Absolute: 0 10*3/uL (ref 0.0–0.5)
Eosinophils Relative: 1 %
HCT: 30.8 % — ABNORMAL LOW (ref 39.0–52.0)
Hemoglobin: 9.7 g/dL — ABNORMAL LOW (ref 13.0–17.0)
Immature Granulocytes: 1 %
Lymphocytes Relative: 10 %
Lymphs Abs: 0.7 10*3/uL (ref 0.7–4.0)
MCH: 27.2 pg (ref 26.0–34.0)
MCHC: 31.5 g/dL (ref 30.0–36.0)
MCV: 86.3 fL (ref 80.0–100.0)
Monocytes Absolute: 0.9 10*3/uL (ref 0.1–1.0)
Monocytes Relative: 13 %
Neutro Abs: 5.6 10*3/uL (ref 1.7–7.7)
Neutrophils Relative %: 75 %
Platelets: 305 10*3/uL (ref 150–400)
RBC: 3.57 MIL/uL — ABNORMAL LOW (ref 4.22–5.81)
RDW: 15.8 % — ABNORMAL HIGH (ref 11.5–15.5)
WBC: 7.4 10*3/uL (ref 4.0–10.5)
nRBC: 0 % (ref 0.0–0.2)

## 2023-01-27 LAB — URINALYSIS, ROUTINE W REFLEX MICROSCOPIC
Bacteria, UA: NONE SEEN
Bilirubin Urine: NEGATIVE
Glucose, UA: NEGATIVE mg/dL
Ketones, ur: NEGATIVE mg/dL
Leukocytes,Ua: NEGATIVE
Nitrite: NEGATIVE
Protein, ur: 100 mg/dL — AB
Specific Gravity, Urine: 1.015 (ref 1.005–1.030)
pH: 6 (ref 5.0–8.0)

## 2023-01-27 LAB — COMPREHENSIVE METABOLIC PANEL
ALT: 15 U/L (ref 0–44)
AST: 11 U/L — ABNORMAL LOW (ref 15–41)
Albumin: 3.5 g/dL (ref 3.5–5.0)
Alkaline Phosphatase: 82 U/L (ref 38–126)
Anion gap: 12 (ref 5–15)
BUN: 17 mg/dL (ref 8–23)
CO2: 24 mmol/L (ref 22–32)
Calcium: 9.3 mg/dL (ref 8.9–10.3)
Chloride: 101 mmol/L (ref 98–111)
Creatinine, Ser: 1.96 mg/dL — ABNORMAL HIGH (ref 0.61–1.24)
GFR, Estimated: 37 mL/min — ABNORMAL LOW (ref >60)
Glucose, Bld: 102 mg/dL — ABNORMAL HIGH (ref 70–99)
Potassium: 2.5 mmol/L — CL (ref 3.5–5.1)
Sodium: 137 mmol/L (ref 135–145)
Total Bilirubin: 1 mg/dL (ref 0.3–1.2)
Total Protein: 6.6 g/dL (ref 6.5–8.1)

## 2023-01-27 LAB — LIPASE, BLOOD: Lipase: 27 U/L (ref 11–51)

## 2023-01-27 LAB — MAGNESIUM: Magnesium: 2 mg/dL (ref 1.7–2.4)

## 2023-01-27 MED ORDER — POTASSIUM CHLORIDE 10 MEQ/100ML IV SOLN
10.0000 meq | INTRAVENOUS | Status: AC
  Administered 2023-01-27 (×4): 10 meq via INTRAVENOUS
  Filled 2023-01-27 (×4): qty 100

## 2023-01-27 MED ORDER — SODIUM CHLORIDE 0.9 % IV SOLN: Freq: Once | INTRAVENOUS | Status: AC

## 2023-01-27 MED ORDER — POTASSIUM CHLORIDE CRYS ER 20 MEQ PO TBCR
60.0000 meq | EXTENDED_RELEASE_TABLET | Freq: Once | ORAL | Status: AC
  Administered 2023-01-27: 60 meq via ORAL
  Filled 2023-01-27: qty 3

## 2023-01-27 MED ORDER — SODIUM CHLORIDE 0.9 % IV BOLUS
1000.0000 mL | Freq: Once | INTRAVENOUS | Status: AC
  Administered 2023-01-27: 1000 mL via INTRAVENOUS

## 2023-01-27 MED ORDER — POTASSIUM CHLORIDE 10 MEQ/100ML IV SOLN: 10.0000 meq | Freq: Once | INTRAVENOUS | Status: DC

## 2023-01-27 NOTE — ED Notes (Addendum)
Admin from [facility] called; informed that pt has bed assignment at [facility]: [address] Gave contact information for RN to RN report. Will be calling for transport

## 2023-01-27 NOTE — ED Notes (Signed)
Called [facility], [place], [place] states nurse to return call for report.

## 2023-01-27 NOTE — ED Notes (Signed)
Called [facility] -- spoke to Bricelyn; exchanged pt destination to [facility] - [address] [place], [place] [address]. Gave contact information of RN that will receive report; accepting physician: Ellis Savage, MD. Transport en route soon

## 2023-01-27 NOTE — ED Provider Notes (Signed)
EMERGENCY DEPARTMENT AT [facility] Provider Note   CSN: [id number] Arrival date & time: 01/27/23  4098     History {Add pertinent medical, surgical, social history, OB history to HPI:1} No chief complaint on file.   Jordan Hanna is a 62 y.o. male who presents emergency department chief complaint of fatigue and weight loss.  He has a history of lupus, chronic immunosuppression on medications for both his lupus and history of renal transplant.  Patient unfortunately had an infection of C. difficile colitis back in April.  He was admitted and at that time weight 188 pounds.  Since that time he has had rapid weight loss.  His wife was at bedside reports that 2 weeks ago he weighed 159 pounds and is now down to the 130s.  The patient reports that he is no longer having any diarrhea but does have about 8 small-volume stools daily.  He reports that anytime he eats or drinks he has the urge to defecate.  He does not have any significant abdominal tenderness, distention or pain.  He has early satiety and states that he can only eat about one half of a sandwich before he becomes too full to eat anymore.  He complains of extreme fatigue and weakness.  His wife states that they have followed with GI although it has been difficult to get in for visit.  He has had upper endoscopy, barium swallow study, biopsy all of which have been negative.  He is scheduled for nuclear gastric emptying study this coming Friday.  Patient states "I just want to be able to get to the appointment."  He is concerned that he is dehydrated due to poor oral intake and failure to thrive at home.  His wife reports that at his last GI visit visit this past Tuesday his potassium was noted to be 2.8.  HPI     Home Medications Prior to Admission medications   Medication Sig Start Date End Date Taking? Authorizing Provider  acetaminophen (TYLENOL) 500 MG tablet Take 500 mg by mouth every 6 (six) hours as needed for  fever.    [provider]  apixaban (ELIQUIS) 2.5 MG TABS tablet Take 2.5 mg by mouth every 12 (twelve) hours.    [provider]  Belatacept (NULOJIX IV) Inject into the vein. Takes the 12th of every month-infusion    [provider]  hydroxychloroquine (PLAQUENIL) 200 MG tablet Take 200 mg by mouth 2 (two) times daily. 06/21/16   [provider]  mirtazapine (REMERON) 30 MG tablet Take 30 mg by mouth at bedtime. 08/04/19   [provider]  potassium chloride SA (KLOR-CON M) 20 MEQ tablet Take 2 tablets (40 mEq total) by mouth daily for 5 days. 11/24/22 11/29/22  Burnadette Pop, MD  sirolimus (RAPAMUNE) 1 MG tablet Take 1-2 mg by mouth as directed. Take 1 tablet (1 mg) Daily Except on (MWF) Take 2 tablets (2 mg) 05/18/21   [provider]  zolpidem (AMBIEN) 10 MG tablet Take 10 mg by mouth at bedtime.    [provider]      Allergies    Prednisone    Review of Systems   Review of Systems  Physical Exam Updated Vital Signs BP (!) 144/101   Pulse 99   Temp 98.3 F (36.8 C) (Oral)   Resp 20   Wt 61.7 kg   SpO2 100%   BMI 20.08 kg/m  Physical Exam Vitals and nursing note reviewed.  Constitutional:  General: He is not in acute distress.    Appearance: He is underweight. He is not diaphoretic.  HENT:     Head: Normocephalic and atraumatic.  Eyes:     General: No scleral icterus.    Conjunctiva/sclera: Conjunctivae normal.  Cardiovascular:     Rate and Rhythm: Normal rate and regular rhythm.     Heart sounds: Normal heart sounds.  Pulmonary:     Effort: Pulmonary effort is normal. No respiratory distress.     Breath sounds: Normal breath sounds.  Abdominal:     Palpations: Abdomen is soft.     Tenderness: There is no abdominal tenderness.  Musculoskeletal:     Cervical back: Normal range of motion and neck supple.  Skin:    General: Skin is warm and dry.  Neurological:     Mental Status: He is lethargic.   Psychiatric:        Behavior: Behavior normal.     ED Results / Procedures / Treatments   Labs (all labs ordered are listed, but only abnormal results are displayed) Labs Reviewed  CBC WITH DIFFERENTIAL/PLATELET - Abnormal; Notable for the following components:      Result Value   RBC 3.57 (*)    Hemoglobin 9.7 (*)    HCT 30.8 (*)    RDW 15.8 (*)    All other components within normal limits  COMPREHENSIVE METABOLIC PANEL  LIPASE, BLOOD  URINALYSIS, ROUTINE W REFLEX MICROSCOPIC  MAGNESIUM    EKG EKG Interpretation Date/Time:  Sunday January 27 2023 08:41:10 EDT Ventricular Rate:  96 PR Interval:  194 QRS Duration:  88 QT Interval:  381 QTC Calculation: 482 R Axis:   -88  Text Interpretation: Sinus rhythm Probable left atrial enlargement Left anterior fascicular block Abnormal R-wave progression, late transition Borderline prolonged QT interval Confirmed by Vonita Moss [phone]) on 01/27/2023 9:09:58 AM  Radiology No results found.  Procedures Procedures  {Document cardiac monitor, telemetry assessment procedure when appropriate:1}  Medications Ordered in ED Medications - No data to display  ED Course/ Medical Decision Making/ A&P Clinical Course as of 01/28/23 1919  Wynelle Link Jan 27, 2023  0939 Potassium(!!): 2.5 [AH]  0939 Hemoglobin(!): 9.7 [AH]  0940 ED EKG [AH]  1020 Case discussed with Dr. Ulla Gallo at [facility] who feels it would be best for pt to be transferred to inpatient setting at [facility] [AH]  1155 Dr. Louie Bun, IM at [facility] has accepted tha paitent in admission.  [AH]    Clinical Course User Index [AH] Arthor Captain, PA-C   {   Click here for ABCD2, HEART and other calculatorsREFRESH Note before signing :1}                          Medical Decision Making Amount and/or Complexity of Data Reviewed Labs: ordered. Decision-making details documented in ED Course. ECG/medicine tests: ordered. Decision-making details documented in ED Course.  Risk Prescription  drug management.   This patient presents to the ED for concern of , this involves an extensive number of treatment options, and is a complaint that carries with it a high risk of complications and morbidity.  ***  Co morbidities: .***  Social Determinants of Health:  .***  Additional history:  {Additional history obtained from ***} {External records from outside source obtained and reviewed including}  Lab Tests:  I Ordered, and personally interpreted labs.  The pertinent results include:  ***  Imaging Studies:  I ordered imaging studies including ***  I independently visualized and interpreted imaging which showed *** I agree with the radiologist interpretation  Cardiac Monitoring/ECG:  .The patient was maintained on a cardiac monitor.  I personally viewed and interpreted the cardiac monitored which showed an underlying rhythm of: ***  Medicines ordered and prescription drug management:  I ordered medication including  Medications  sodium chloride 0.9 % bolus 1,000 mL (0 mLs Intravenous Stopped 01/27/23 1046)  potassium chloride 10 mEq in 100 mL IVPB (0 mEq Intravenous Stopped 01/27/23 1345)  potassium chloride SA (KLOR-CON M) CR tablet 60 mEq (60 mEq Oral Given 01/27/23 0948)  0.9 %  sodium chloride infusion (0 mLs Intravenous Stopped 01/27/23 1345)   for *** Reevaluation of the patient after these medicines showed that the patient {resolved/improved/worsened:23923::"improved"} I have reviewed the patients home medicines and have made adjustments as needed  Test Considered:  .***  Critical Interventions:  .***  Consultations Obtained: ***  Problem List / ED Course:  .   ICD-10-CM   1. Hypokalemia  E87.6     2. Failure to thrive in adult  R62.7     3. Generalized weakness  R53.1     4. Dehydration  E86.0     5. History of kidney transplant  Z94.0       MDM: ***   Dispostion:  After consideration of the diagnostic results and the patients response to  treatment, I feel that the patent would benefit from ***.   {Document critical care time when appropriate:1} {Document review of labs and clinical decision tools ie heart score, Chads2Vasc2 etc:1}  {Document your independent review of radiology images, and any outside records:1} {Document your discussion with family members, caretakers, and with consultants:1} {Document social determinants of health affecting pt's care:1} {Document your decision making why or why not admission, treatments were needed:1} Final Clinical Impression(s) / ED Diagnoses Final diagnoses:  None    Rx / DC Orders ED Discharge Orders     None

## 2023-01-27 NOTE — ED Triage Notes (Signed)
Pt is kidney transplant patient, pt had cdiff dx in April, has lost 53 lbs since April. He was discharged from [facility] 2 weeks , for which he was being seen for abdominal fullness, had barium swallow and colonoscopy which were negative. Has a gastric emptying test scheduled for Friday. Wife concerned about pt has not been keeping anything in system since dc .

## 2023-03-21 IMAGING — MR MR PROSTATE W/O CM
8 series · 48 of 48 positions shown · non-contrast
Comparison: None.

CLINICAL DATA: Elevated PSA. Renal transplant patient with renal
insufficiency.

EXAM:
MR PROSTATE WITHOUT CONTRAST
TECHNIQUE: Multiplanar multisequence MRI images were obtained of the pelvis
centered about the prostate, without contrast administration. No
intravenous contrast was administered due to patient's severe renal

[Series 3: T2 · coronal · 3.0mm · 0.56mm/px · 5 of 24 slices shown (1 of 3)]
[im 1/24]
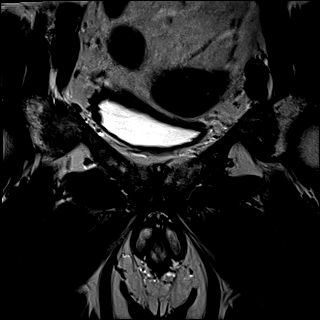
[im 6/24]
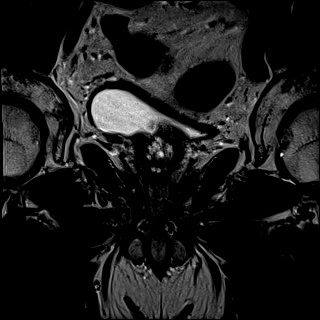
[im 12/24]
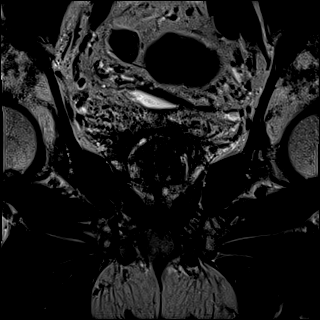
[im 18/24]
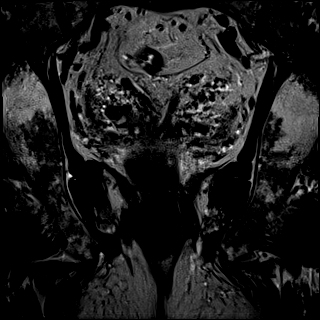
[im 24/24]
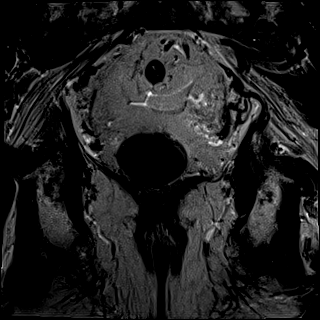

[Series 4: T1 · axial · 5.0mm · 1.25mm/px · z∈[-66,+129]mm · 12 of 80 slices shown]
[im 1/80]
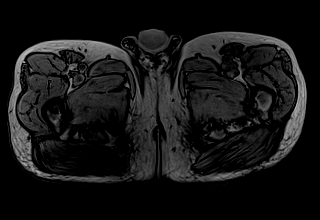
[im 8/80]
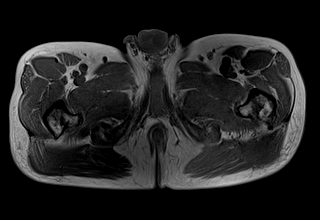
[im 15/80]
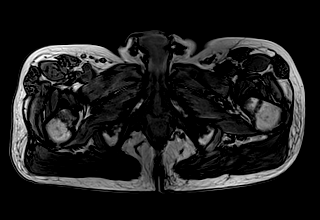
[im 22/80]
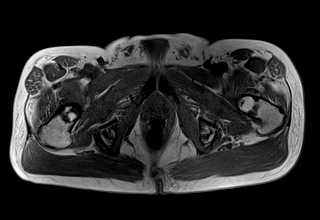
[im 29/80]
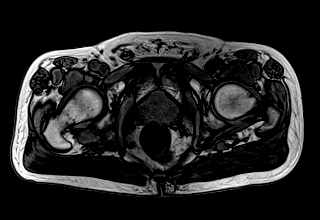
[im 36/80]
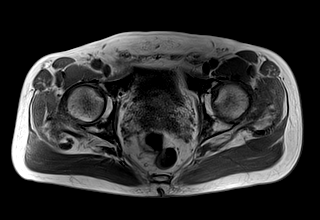
[im 44/80]
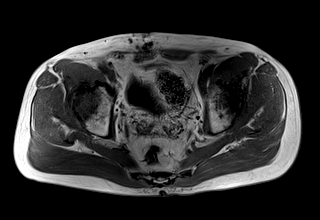
[im 51/80]
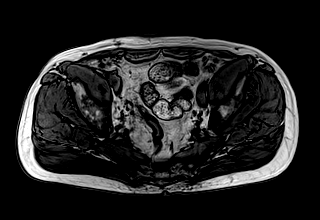
[im 58/80]
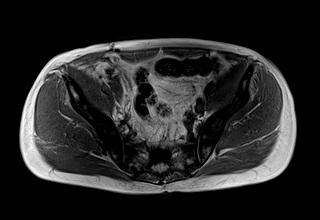
[im 65/80]
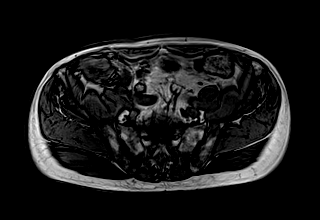
[im 72/80]
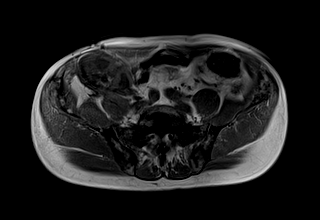
[im 80/80]
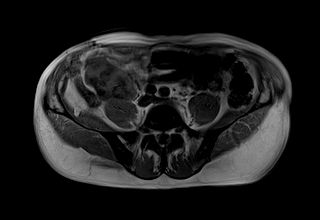

[Series 5: DWI · axial · 3.0mm · 1.75mm/px · z∈[-19,+41]mm · 10 of 63 slices shown (1 of 3)]
[im 1/63]
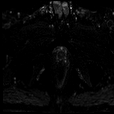
[im 7/63]
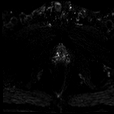
[im 14/63]
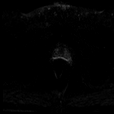
[im 21/63]
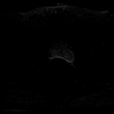
[im 28/63]
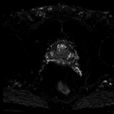
[im 35/63]
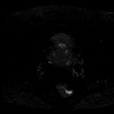
[im 42/63]
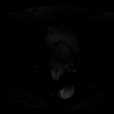
[im 49/63]
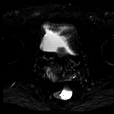
[im 56/63]
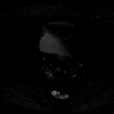
[im 63/63]
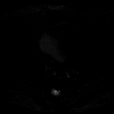

[Series 6: DWI · axial · 3.0mm · 1.75mm/px · z∈[-19,+41]mm · 3 of 21 slices shown (2 of 3)]
[im 1/21]
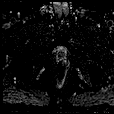
[im 11/21]
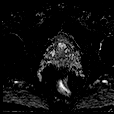
[im 21/21]
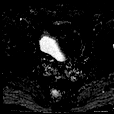

[Series 7: DWI · axial · 3.0mm · 1.75mm/px · z∈[-19,+41]mm · 3 of 21 slices shown (3 of 3)]
[im 1/21]
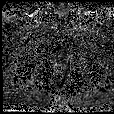
[im 11/21]
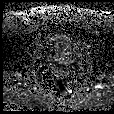
[im 21/21]
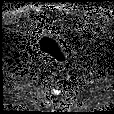

[Series 8: T2 · axial · 3.0mm · 0.56mm/px · z∈[-19,+41]mm · 3 of 21 slices shown (2 of 3)]
[im 1/21]
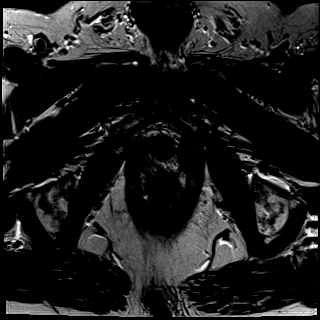
[im 11/21]
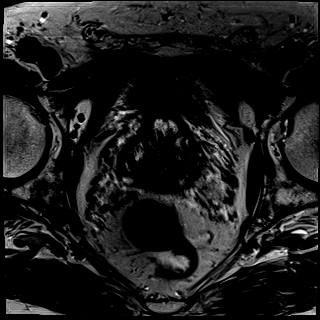
[im 21/21]
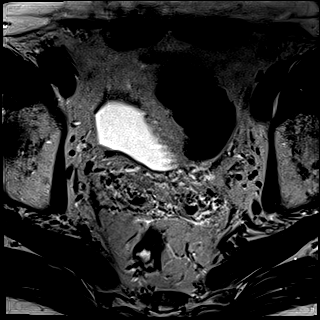

[Series 9: T2 · axial · 1.0mm · 1.04mm/px · z∈[-18,+41]mm · 9 of 60 slices shown (3 of 3)]
[im 1/60]
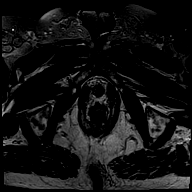
[im 8/60]
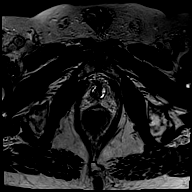
[im 15/60]
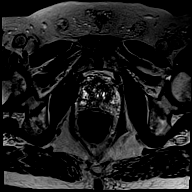
[im 23/60]
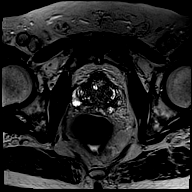
[im 30/60]
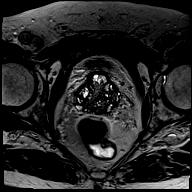
[im 37/60]
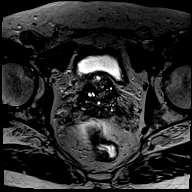
[im 45/60]
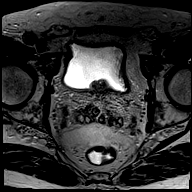
[im 52/60]
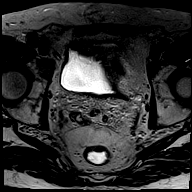
[im 60/60]
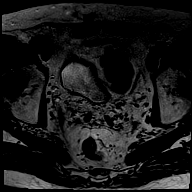

[Series 10: pre t1_twist_tra_dyn · axial · non-contrast · 3.5mm · 0.99mm/px · z∈[-18,+41]mm · 3 of 18 slices shown]
[im 1/18]
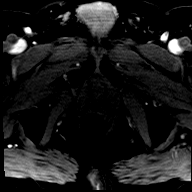
[im 9/18]
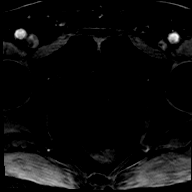
[im 18/18]
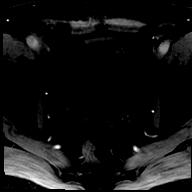

[48 of 48 positions shown; findings below may reference images not displayed]

FINDINGS: Prostate:

-- Peripheral Zone: Linear/wedge shaped hypointensities are noted on
ADC; however, no focal ADC hypointense or high b-value DWI
hyperintense nodules are identified.

-- Transition/Central Zone: Mildly enlarged with encapsulated BPH
nodules noted; however, no suspicious nodules are identified on
T2-weighted or diffusion sequences.

-- Measurements/Volume:  4.5 by 4.0 x 5.5 cm (volume = 52 cm^3)

Transcapsular spread:  Absent

Seminal vesicle involvement:  Absent

Neurovascular bundle involvement:  Absent

Pelvic adenopathy: None visualized

Bone metastasis: None visualized

Other:  Renal transplant noted in right iliac fossa.
IMPRESSION: No radiographic evidence of high-grade prostate carcinoma. PI-RADS 2
(v.2.1): Low (clinically significant cancer unlikely)

## 2023-04-12 ENCOUNTER — Other Ambulatory Visit: Payer: Self-pay | Admitting: *Deleted

## 2023-04-12 DIAGNOSIS — K604 Rectal fistula: Secondary | ICD-10-CM

## 2023-04-15 ENCOUNTER — Ambulatory Visit
Admission: RE | Admit: 2023-04-15 | Discharge: 2023-04-15 | Disposition: A | Discharge status: Home / Self Care | Payer: Commercial Managed Care - PPO | Source: Ambulatory Visit | Attending: *Deleted | Admitting: *Deleted

## 2023-04-15 DIAGNOSIS — K604 Rectal fistula: Secondary | ICD-10-CM

## 2023-04-15 MED ORDER — GADOPICLENOL 0.5 MMOL/ML IV SOLN
7.5000 mL | Freq: Once | INTRAVENOUS | Status: AC | PRN
  Administered 2023-04-15: 7.5 mL via INTRAVENOUS
